# Patient Record
Sex: Male | Born: 1950 | Race: White | Hispanic: No | Marital: Married | Smoking: Former smoker
Health system: Southern US, Community
[De-identification: ages and names within clinical notes are randomized; demographics above are authoritative.]

## PROBLEM LIST (undated history)

## (undated) DIAGNOSIS — I471 Supraventricular tachycardia, unspecified: Secondary | ICD-10-CM

## (undated) DIAGNOSIS — N200 Calculus of kidney: Secondary | ICD-10-CM

## (undated) DIAGNOSIS — I4891 Unspecified atrial fibrillation: Secondary | ICD-10-CM

## (undated) DIAGNOSIS — I10 Essential (primary) hypertension: Secondary | ICD-10-CM

## (undated) DIAGNOSIS — L300 Nummular dermatitis: Secondary | ICD-10-CM

## (undated) DIAGNOSIS — D229 Melanocytic nevi, unspecified: Secondary | ICD-10-CM

## (undated) DIAGNOSIS — L719 Rosacea, unspecified: Secondary | ICD-10-CM

## (undated) HISTORY — PX: TONSILLECTOMY: SUR1361

## (undated) HISTORY — PX: SINUS SURGERY WITH INSTATRAK: SHX5215

## (undated) HISTORY — DX: Rosacea, unspecified: L71.9

## (undated) HISTORY — DX: Supraventricular tachycardia, unspecified: I47.10

## (undated) HISTORY — DX: Nummular dermatitis: L30.0

## (undated) HISTORY — PX: LIPOMA EXCISION: SHX5283

## (undated) HISTORY — DX: Unspecified atrial fibrillation: I48.91

## (undated) HISTORY — DX: Supraventricular tachycardia: I47.1

## (undated) HISTORY — DX: Essential (primary) hypertension: I10

## (undated) HISTORY — DX: Calculus of kidney: N20.0

---
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
## 2003-12-09 ENCOUNTER — Ambulatory Visit (HOSPITAL_COMMUNITY): Admission: RE | Admit: 2003-12-09 | Discharge: 2003-12-09 | Payer: Self-pay | Admitting: Internal Medicine

## 2004-09-11 ENCOUNTER — Ambulatory Visit: Payer: Self-pay | Admitting: Internal Medicine

## 2005-01-19 ENCOUNTER — Ambulatory Visit: Payer: Self-pay | Admitting: Internal Medicine

## 2005-04-30 ENCOUNTER — Ambulatory Visit: Payer: Self-pay | Admitting: Internal Medicine

## 2005-05-06 ENCOUNTER — Ambulatory Visit: Payer: Self-pay | Admitting: Internal Medicine

## 2005-05-13 ENCOUNTER — Ambulatory Visit: Payer: Self-pay | Admitting: Internal Medicine

## 2005-06-07 ENCOUNTER — Ambulatory Visit: Payer: Self-pay | Admitting: Internal Medicine

## 2005-07-02 ENCOUNTER — Ambulatory Visit: Payer: Self-pay | Admitting: Internal Medicine

## 2005-07-16 ENCOUNTER — Ambulatory Visit: Payer: Self-pay | Admitting: Internal Medicine

## 2005-07-28 ENCOUNTER — Ambulatory Visit: Payer: Self-pay | Admitting: Gastroenterology

## 2005-08-05 ENCOUNTER — Encounter (INDEPENDENT_AMBULATORY_CARE_PROVIDER_SITE_OTHER): Payer: Self-pay | Admitting: Specialist

## 2005-08-05 ENCOUNTER — Ambulatory Visit: Payer: Self-pay | Admitting: Gastroenterology

## 2005-08-20 ENCOUNTER — Ambulatory Visit: Payer: Self-pay | Admitting: Internal Medicine

## 2005-10-20 ENCOUNTER — Ambulatory Visit: Payer: Self-pay | Admitting: Internal Medicine

## 2006-01-17 ENCOUNTER — Ambulatory Visit: Payer: Self-pay | Admitting: Internal Medicine

## 2006-02-17 ENCOUNTER — Ambulatory Visit: Payer: Self-pay | Admitting: Internal Medicine

## 2006-02-28 ENCOUNTER — Ambulatory Visit: Payer: Self-pay | Admitting: Internal Medicine

## 2006-03-01 ENCOUNTER — Ambulatory Visit: Payer: Self-pay | Admitting: Internal Medicine

## 2006-05-19 ENCOUNTER — Ambulatory Visit: Payer: Self-pay | Admitting: Internal Medicine

## 2006-06-30 ENCOUNTER — Ambulatory Visit: Payer: Self-pay | Admitting: Internal Medicine

## 2006-07-15 ENCOUNTER — Ambulatory Visit: Payer: Self-pay | Admitting: Internal Medicine

## 2006-08-08 ENCOUNTER — Ambulatory Visit: Payer: Self-pay | Admitting: Internal Medicine

## 2006-11-21 ENCOUNTER — Ambulatory Visit: Payer: Self-pay | Admitting: Internal Medicine

## 2006-12-28 ENCOUNTER — Ambulatory Visit: Payer: Self-pay | Admitting: Internal Medicine

## 2006-12-28 LAB — CONVERTED CEMR LAB
ALT: 16 units/L (ref 0–40)
Alkaline Phosphatase: 70 units/L (ref 39–117)
BUN: 19 mg/dL (ref 6–23)
Basophils Relative: 0.5 % (ref 0.0–1.0)
Bilirubin Urine: NEGATIVE
Bilirubin, Direct: 0.1 mg/dL (ref 0.0–0.3)
CO2: 28 meq/L (ref 19–32)
Calcium: 9.5 mg/dL (ref 8.4–10.5)
Creatinine, Ser: 1 mg/dL (ref 0.4–1.5)
Eosinophils Relative: 6.1 % — ABNORMAL HIGH (ref 0.0–5.0)
Glucose, Bld: 99 mg/dL (ref 70–99)
Hemoglobin: 15.3 g/dL (ref 13.0–17.0)
Leukocytes, UA: NEGATIVE
Lymphocytes Relative: 22.6 % (ref 12.0–46.0)
Monocytes Absolute: 0.5 10*3/uL (ref 0.2–0.7)
Monocytes Relative: 7.3 % (ref 3.0–11.0)
Neutro Abs: 4.1 10*3/uL (ref 1.4–7.7)
Nitrite: NEGATIVE
RDW: 12.6 % (ref 11.5–14.6)
Specific Gravity, Urine: 1.025 (ref 1.000–1.03)
Total Bilirubin: 0.7 mg/dL (ref 0.3–1.2)
Total Protein, Urine: NEGATIVE mg/dL
Total Protein: 6.9 g/dL (ref 6.0–8.3)
Urobilinogen, UA: 0.2 (ref 0.0–1.0)
VLDL: 8 mg/dL (ref 0–40)
WBC: 6.4 10*3/uL (ref 4.5–10.5)
pH: 6.5 (ref 5.0–8.0)

## 2007-01-03 ENCOUNTER — Ambulatory Visit: Payer: Self-pay | Admitting: Internal Medicine

## 2007-01-11 ENCOUNTER — Ambulatory Visit: Payer: Self-pay | Admitting: Internal Medicine

## 2007-01-27 ENCOUNTER — Ambulatory Visit: Payer: Self-pay | Admitting: Internal Medicine

## 2007-02-02 ENCOUNTER — Ambulatory Visit: Payer: Self-pay | Admitting: Internal Medicine

## 2007-02-09 ENCOUNTER — Ambulatory Visit: Payer: Self-pay | Admitting: Internal Medicine

## 2007-04-04 ENCOUNTER — Ambulatory Visit: Payer: Self-pay | Admitting: Internal Medicine

## 2007-07-10 ENCOUNTER — Ambulatory Visit: Payer: Self-pay | Admitting: Internal Medicine

## 2007-08-09 ENCOUNTER — Ambulatory Visit: Payer: Self-pay | Admitting: Internal Medicine

## 2007-08-18 ENCOUNTER — Ambulatory Visit: Payer: Self-pay | Admitting: Internal Medicine

## 2007-10-09 ENCOUNTER — Telehealth (INDEPENDENT_AMBULATORY_CARE_PROVIDER_SITE_OTHER): Payer: Self-pay | Admitting: *Deleted

## 2007-12-18 ENCOUNTER — Ambulatory Visit: Payer: Self-pay | Admitting: Internal Medicine

## 2008-01-08 ENCOUNTER — Ambulatory Visit: Payer: Self-pay | Admitting: Internal Medicine

## 2008-01-08 DIAGNOSIS — J4520 Mild intermittent asthma, uncomplicated: Secondary | ICD-10-CM

## 2008-01-08 DIAGNOSIS — J309 Allergic rhinitis, unspecified: Secondary | ICD-10-CM | POA: Insufficient documentation

## 2008-01-08 DIAGNOSIS — I4891 Unspecified atrial fibrillation: Secondary | ICD-10-CM | POA: Insufficient documentation

## 2008-04-25 ENCOUNTER — Ambulatory Visit: Payer: Self-pay | Admitting: Internal Medicine

## 2008-05-30 ENCOUNTER — Ambulatory Visit: Payer: Self-pay | Admitting: Internal Medicine

## 2008-05-30 LAB — CONVERTED CEMR LAB
ALT: 18 units/L (ref 0–53)
AST: 24 units/L (ref 0–37)
Albumin: 4 g/dL (ref 3.5–5.2)
Alkaline Phosphatase: 63 units/L (ref 39–117)
BUN: 15 mg/dL (ref 6–23)
CO2: 29 meq/L (ref 19–32)
Chloride: 107 meq/L (ref 96–112)
Eosinophils Absolute: 0.4 10*3/uL (ref 0.0–0.7)
Eosinophils Relative: 6.6 % — ABNORMAL HIGH (ref 0.0–5.0)
GFR calc non Af Amer: 93 mL/min
Hemoglobin, Urine: NEGATIVE
LDL Cholesterol: 124 mg/dL — ABNORMAL HIGH (ref 0–99)
Leukocytes, UA: NEGATIVE
Lymphocytes Relative: 28.4 % (ref 12.0–46.0)
MCV: 89.8 fL (ref 78.0–100.0)
Monocytes Relative: 8.1 % (ref 3.0–12.0)
Neutrophils Relative %: 56.6 % (ref 43.0–77.0)
Nitrite: NEGATIVE
Platelets: 224 10*3/uL (ref 150–400)
Potassium: 4.4 meq/L (ref 3.5–5.1)
RBC: 4.82 M/uL (ref 4.22–5.81)
Specific Gravity, Urine: 1.02 (ref 1.000–1.03)
Total CHOL/HDL Ratio: 3.4
Urobilinogen, UA: 0.2 (ref 0.0–1.0)
VLDL: 10 mg/dL (ref 0–40)
WBC: 6.6 10*3/uL (ref 4.5–10.5)

## 2008-06-03 ENCOUNTER — Ambulatory Visit: Payer: Self-pay | Admitting: Internal Medicine

## 2008-06-03 DIAGNOSIS — R5381 Other malaise: Secondary | ICD-10-CM | POA: Insufficient documentation

## 2008-06-03 DIAGNOSIS — M79609 Pain in unspecified limb: Secondary | ICD-10-CM

## 2008-06-03 DIAGNOSIS — L719 Rosacea, unspecified: Secondary | ICD-10-CM | POA: Insufficient documentation

## 2008-06-03 DIAGNOSIS — I471 Supraventricular tachycardia: Secondary | ICD-10-CM

## 2008-06-03 DIAGNOSIS — R5383 Other fatigue: Secondary | ICD-10-CM

## 2008-06-07 ENCOUNTER — Encounter: Payer: Self-pay | Admitting: Internal Medicine

## 2008-08-20 ENCOUNTER — Encounter: Payer: Self-pay | Admitting: Internal Medicine

## 2008-08-20 ENCOUNTER — Ambulatory Visit: Payer: Self-pay | Admitting: Internal Medicine

## 2008-08-20 DIAGNOSIS — R002 Palpitations: Secondary | ICD-10-CM | POA: Insufficient documentation

## 2008-08-26 ENCOUNTER — Ambulatory Visit: Payer: Self-pay | Admitting: Internal Medicine

## 2008-08-26 ENCOUNTER — Ambulatory Visit: Payer: Self-pay | Admitting: Cardiology

## 2008-08-26 LAB — CONVERTED CEMR LAB
BUN: 12 mg/dL (ref 6–23)
Chloride: 107 meq/L (ref 96–112)
GFR calc Af Amer: 112 mL/min
Glucose, Bld: 93 mg/dL (ref 70–99)
Potassium: 4 meq/L (ref 3.5–5.1)

## 2008-08-29 ENCOUNTER — Ambulatory Visit: Payer: Self-pay | Admitting: Cardiology

## 2008-08-29 ENCOUNTER — Ambulatory Visit: Payer: Self-pay

## 2008-08-29 ENCOUNTER — Encounter: Payer: Self-pay | Admitting: Cardiology

## 2008-12-02 ENCOUNTER — Ambulatory Visit: Payer: Self-pay | Admitting: Internal Medicine

## 2009-01-15 ENCOUNTER — Ambulatory Visit: Payer: Self-pay | Admitting: Internal Medicine

## 2009-06-03 ENCOUNTER — Ambulatory Visit: Payer: Self-pay | Admitting: Internal Medicine

## 2009-06-03 DIAGNOSIS — R21 Rash and other nonspecific skin eruption: Secondary | ICD-10-CM

## 2009-06-04 ENCOUNTER — Ambulatory Visit: Payer: Self-pay | Admitting: Internal Medicine

## 2009-06-18 ENCOUNTER — Encounter (INDEPENDENT_AMBULATORY_CARE_PROVIDER_SITE_OTHER): Payer: Self-pay | Admitting: *Deleted

## 2009-06-18 ENCOUNTER — Telehealth: Payer: Self-pay | Admitting: Internal Medicine

## 2009-06-20 ENCOUNTER — Ambulatory Visit: Payer: Self-pay | Admitting: Internal Medicine

## 2009-06-30 ENCOUNTER — Telehealth: Payer: Self-pay | Admitting: Internal Medicine

## 2009-09-12 ENCOUNTER — Ambulatory Visit: Payer: Self-pay | Admitting: Internal Medicine

## 2009-09-16 ENCOUNTER — Ambulatory Visit: Payer: Self-pay | Admitting: Internal Medicine

## 2009-09-19 ENCOUNTER — Ambulatory Visit: Payer: Self-pay | Admitting: Internal Medicine

## 2009-09-26 ENCOUNTER — Ambulatory Visit: Payer: Self-pay | Admitting: Internal Medicine

## 2009-10-06 ENCOUNTER — Ambulatory Visit: Payer: Self-pay | Admitting: Internal Medicine

## 2009-10-13 ENCOUNTER — Ambulatory Visit: Payer: Self-pay | Admitting: Internal Medicine

## 2009-10-21 ENCOUNTER — Encounter: Payer: Self-pay | Admitting: Internal Medicine

## 2009-10-21 ENCOUNTER — Ambulatory Visit: Payer: Self-pay | Admitting: Internal Medicine

## 2009-10-22 ENCOUNTER — Ambulatory Visit: Payer: Self-pay | Admitting: Internal Medicine

## 2009-10-28 ENCOUNTER — Ambulatory Visit: Payer: Self-pay | Admitting: Internal Medicine

## 2009-11-05 ENCOUNTER — Ambulatory Visit: Payer: Self-pay | Admitting: Internal Medicine

## 2009-11-11 ENCOUNTER — Ambulatory Visit: Payer: Self-pay | Admitting: Internal Medicine

## 2009-11-18 ENCOUNTER — Ambulatory Visit: Payer: Self-pay | Admitting: Internal Medicine

## 2009-11-25 ENCOUNTER — Ambulatory Visit: Payer: Self-pay | Admitting: Internal Medicine

## 2009-12-02 ENCOUNTER — Ambulatory Visit: Payer: Self-pay | Admitting: Internal Medicine

## 2009-12-09 ENCOUNTER — Ambulatory Visit: Payer: Self-pay | Admitting: Internal Medicine

## 2009-12-16 ENCOUNTER — Ambulatory Visit: Payer: Self-pay | Admitting: Internal Medicine

## 2009-12-23 ENCOUNTER — Ambulatory Visit: Payer: Self-pay | Admitting: Internal Medicine

## 2009-12-30 ENCOUNTER — Ambulatory Visit: Payer: Self-pay | Admitting: Internal Medicine

## 2010-01-06 ENCOUNTER — Ambulatory Visit: Payer: Self-pay | Admitting: Internal Medicine

## 2010-01-13 ENCOUNTER — Ambulatory Visit: Payer: Self-pay | Admitting: Internal Medicine

## 2010-01-20 ENCOUNTER — Ambulatory Visit: Payer: Self-pay | Admitting: Internal Medicine

## 2010-01-27 ENCOUNTER — Ambulatory Visit: Payer: Self-pay | Admitting: Internal Medicine

## 2010-02-03 ENCOUNTER — Ambulatory Visit: Payer: Self-pay | Admitting: Internal Medicine

## 2010-02-10 ENCOUNTER — Ambulatory Visit: Payer: Self-pay | Admitting: Internal Medicine

## 2010-02-17 ENCOUNTER — Ambulatory Visit: Payer: Self-pay | Admitting: Internal Medicine

## 2010-02-18 ENCOUNTER — Ambulatory Visit: Payer: Self-pay | Admitting: Internal Medicine

## 2010-02-24 ENCOUNTER — Ambulatory Visit: Payer: Self-pay | Admitting: Internal Medicine

## 2010-03-03 ENCOUNTER — Ambulatory Visit: Payer: Self-pay | Admitting: Internal Medicine

## 2010-03-10 ENCOUNTER — Ambulatory Visit: Payer: Self-pay | Admitting: Internal Medicine

## 2010-03-17 ENCOUNTER — Ambulatory Visit: Payer: Self-pay | Admitting: Internal Medicine

## 2010-03-24 ENCOUNTER — Ambulatory Visit: Payer: Self-pay | Admitting: Internal Medicine

## 2010-04-02 ENCOUNTER — Ambulatory Visit: Payer: Self-pay | Admitting: Internal Medicine

## 2010-04-09 ENCOUNTER — Ambulatory Visit: Payer: Self-pay | Admitting: Internal Medicine

## 2010-04-16 ENCOUNTER — Ambulatory Visit: Payer: Self-pay | Admitting: Internal Medicine

## 2010-04-23 ENCOUNTER — Ambulatory Visit: Payer: Self-pay | Admitting: Internal Medicine

## 2010-04-30 ENCOUNTER — Ambulatory Visit: Payer: Self-pay | Admitting: Internal Medicine

## 2010-05-07 ENCOUNTER — Ambulatory Visit: Payer: Self-pay | Admitting: Internal Medicine

## 2010-05-14 ENCOUNTER — Ambulatory Visit: Payer: Self-pay | Admitting: Internal Medicine

## 2010-05-21 ENCOUNTER — Ambulatory Visit: Payer: Self-pay | Admitting: Internal Medicine

## 2010-06-02 ENCOUNTER — Ambulatory Visit: Payer: Self-pay | Admitting: Internal Medicine

## 2010-06-09 ENCOUNTER — Ambulatory Visit: Payer: Self-pay | Admitting: Internal Medicine

## 2010-06-16 ENCOUNTER — Ambulatory Visit: Payer: Self-pay | Admitting: Internal Medicine

## 2010-06-18 ENCOUNTER — Ambulatory Visit: Payer: Self-pay | Admitting: Internal Medicine

## 2010-06-18 LAB — CONVERTED CEMR LAB
AST: 20 units/L (ref 0–37)
Albumin: 3.9 g/dL (ref 3.5–5.2)
Basophils Absolute: 0 10*3/uL (ref 0.0–0.1)
Bilirubin Urine: NEGATIVE
CO2: 28 meq/L (ref 19–32)
Eosinophils Absolute: 0.5 10*3/uL (ref 0.0–0.7)
Glucose, Bld: 86 mg/dL (ref 70–99)
HCT: 44.5 % (ref 39.0–52.0)
HDL: 53.2 mg/dL (ref >39.00)
Hemoglobin: 15.2 g/dL (ref 13.0–17.0)
Lymphs Abs: 1.9 10*3/uL (ref 0.7–4.0)
MCHC: 34.3 g/dL (ref 30.0–36.0)
MCV: 89.8 fL (ref 78.0–100.0)
Neutro Abs: 4.2 10*3/uL (ref 1.4–7.7)
Nitrite: NEGATIVE
Potassium: 4.6 meq/L (ref 3.5–5.1)
RDW: 13.3 % (ref 11.5–14.6)
Sodium: 142 meq/L (ref 135–145)
TSH: 0.94 microintl units/mL (ref 0.35–5.50)
Total Protein, Urine: NEGATIVE mg/dL
Triglycerides: 69 mg/dL (ref 0.0–149.0)
pH: 6 (ref 5.0–8.0)

## 2010-06-23 ENCOUNTER — Ambulatory Visit: Payer: Self-pay | Admitting: Internal Medicine

## 2010-06-24 ENCOUNTER — Ambulatory Visit: Payer: Self-pay | Admitting: Internal Medicine

## 2010-06-24 DIAGNOSIS — R1031 Right lower quadrant pain: Secondary | ICD-10-CM

## 2010-06-24 DIAGNOSIS — R109 Unspecified abdominal pain: Secondary | ICD-10-CM

## 2010-06-25 DIAGNOSIS — I10 Essential (primary) hypertension: Secondary | ICD-10-CM | POA: Insufficient documentation

## 2010-06-30 ENCOUNTER — Ambulatory Visit: Payer: Self-pay | Admitting: Internal Medicine

## 2010-07-01 ENCOUNTER — Ambulatory Visit: Payer: Self-pay | Admitting: Internal Medicine

## 2010-07-07 ENCOUNTER — Ambulatory Visit: Payer: Self-pay | Admitting: Internal Medicine

## 2010-07-14 ENCOUNTER — Ambulatory Visit: Payer: Self-pay | Admitting: Internal Medicine

## 2010-07-21 ENCOUNTER — Ambulatory Visit: Payer: Self-pay | Admitting: Internal Medicine

## 2010-07-28 ENCOUNTER — Ambulatory Visit: Payer: Self-pay | Admitting: Internal Medicine

## 2010-08-04 ENCOUNTER — Ambulatory Visit: Payer: Self-pay | Admitting: Internal Medicine

## 2010-08-06 ENCOUNTER — Emergency Department (HOSPITAL_COMMUNITY): Admission: EM | Admit: 2010-08-06 | Discharge: 2010-06-15 | Payer: Self-pay | Admitting: Emergency Medicine

## 2010-08-11 ENCOUNTER — Encounter: Payer: Self-pay | Admitting: Gastroenterology

## 2010-08-11 ENCOUNTER — Ambulatory Visit: Payer: Self-pay | Admitting: Internal Medicine

## 2010-08-18 ENCOUNTER — Ambulatory Visit: Payer: Self-pay | Admitting: Internal Medicine

## 2010-08-28 ENCOUNTER — Ambulatory Visit: Payer: Self-pay | Admitting: Internal Medicine

## 2010-09-12 ENCOUNTER — Ambulatory Visit: Payer: Self-pay | Admitting: Internal Medicine

## 2010-09-15 ENCOUNTER — Ambulatory Visit
Admission: RE | Admit: 2010-09-15 | Discharge: 2010-09-15 | Payer: Self-pay | Source: Home / Self Care | Attending: Internal Medicine | Admitting: Internal Medicine

## 2010-09-15 ENCOUNTER — Encounter: Payer: Self-pay | Admitting: Internal Medicine

## 2010-09-17 ENCOUNTER — Ambulatory Visit: Payer: Self-pay | Admitting: Internal Medicine

## 2010-09-23 ENCOUNTER — Ambulatory Visit: Payer: Self-pay | Admitting: Internal Medicine

## 2010-09-29 ENCOUNTER — Ambulatory Visit: Payer: Self-pay | Admitting: Internal Medicine

## 2010-10-01 NOTE — Assessment & Plan Note (Signed)
Summary: rov 1 yr ///kp   Primary Provider/Referring Provider:  Plotnikov  CC:  Yearly follow up visit-allergies.PT c/o stuffiness in mornings..  History of Present Illness: Hx of ATRIAL FIBRILLATION (ICD-427.31) ASTHMA (ICD-493.90) ALLERGIC RHINITIS (ICD-477.9)  He returns for scheduled 6 month follow-up.  Advair was changed to symbicort in February because of thrush.  He also had a retinal bleed and questioned whether that was related to his inhaler.  Because of that he never actually took the symbicort and he quit Advair.  Since then his lungs have occasionally felt a little tight, but not uncomfortable.  He has only used his albuterol rescue inhaler one time in the past 3 weeks.  He is a bike rider, sometimes going 100 to 150 miles in a week.  He continues allergy vaccine at 1:10 given by a friend, who is a Engineer, civil (consulting), with no problems.  We have reviewed risk and benefit.  His breathing does not wake him and he is satisfied to stay off of maintenance inhalers for now.   09-21-2009- Asthma, allergic rhinitis, hx atrial fib He continues allergy shots at 1:10 and still feels they help. He now gets his shots here with no problem. Using loratadine for rash since August.  Skin Bx suggested drug allergy and meds were adjusted. Dermatology appointment is pending. Rare need for Proventil rescue inhaler. It doesn't affect his heart rhythm. Biking regularly again after job change.  September 15, 2010- Asthma, allergic rhinitis, hx atrial fib Nurse-CC: Yearly follow up visit-allergies.PT c/o stuffiness in mornings. I think he is better off with allergy shots and doesn't want to quit. Wakes recently with some nasal congestion. We discussed decongestants. Not much itch, sneeze, drainage. Uses loratadine more for rash than for nose.  Asthma much better in past year, using rescue inhaler only twice.  Meds have not triggerd his PAF, which is managed w/ metoprolol. Still bike-rider.      Asthma  History    Initial Asthma Severity Rating:    Age range: 12+ years    Symptoms: 0-2 days/week    Nighttime Awakenings: 0-2/month    Interferes w/ normal activity: no limitations    SABA use (not for EIB): 0-2 days/week    Asthma Severity Assessment: Intermittent   Preventive Screening-Counseling & Management  Alcohol-Tobacco     Smoking Status: never     Year Quit: 16 years  Current Medications (verified): 1)  Proventil Hfa 108 (90 Base) Mcg/act  Aers (Albuterol Sulfate) .... 2 Puffs Four Times A Day As Needed 2)  Allergy Vaccine 1:10 Gh 3)  Vitamin D3 1000 Unit  Tabs (Cholecalciferol) .Marland Kitchen.. 1 By Mouth Daily 4)  Aspirin 325 Mg Tabs (Aspirin) .... Take 1 Tab By Mouth Every Day 5)  Triamcinolone Acetonide 0.5 % Crea (Triamcinolone Acetonide) .... Apply Bid To Affected Area 6)  Loratadine 10 Mg  Tabs (Loratadine) .... Once Daily For Allergies 7)  Doxycycline Hyclate 100 Mg Tabs (Doxycycline Hyclate) .... 2 By Mouth Once Daily 8)  Ibuprofen 600 Mg Tabs (Ibuprofen) .Marland Kitchen.. 1 By Mouth Bid  Pc X 1 Wk Then As Needed For  Pain 9)  Metoprolol Succinate 50 Mg Xr24h-Tab (Metoprolol Succinate) .Marland Kitchen.. 1 By Mouth Qd  Allergies (verified): 1)  ! Verapamil  Past History:  Past Surgical History: Last updated: 01/08/2008 removed lipoma Tonsillectomy  Family History: Last updated: 09/21/2009 Mother died lymphoma and MI Father living  Social History: Last updated: 06/24/2010 Patient states former smoker.  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  The patient  denies shortness of breath with activity, shortness of breath at rest, productive cough, non-productive cough, coughing up blood, chest pain, irregular heartbeats, acid heartburn, indigestion, loss of appetite, weight change, abdominal pain, difficulty swallowing, sore throat, tooth/dental problems, headaches, sneezing, itching, ear ache, depression, rash, and change in color of mucus.    Vital Signs:  Patient profile:   60 year old male Height:      74 inches Weight:      214.13 pounds BMI:     27.59 O2 Sat:      98 % on Room air Pulse rate:   69 / minute BP sitting:   142 / 80  (left arm) Cuff size:   regular  Vitals Entered By: Abigail Miyamoto RN (September 15, 2010 9:00 AM)  O2 Flow:  Room air CC: Yearly follow up visit-allergies.PT c/o stuffiness in mornings.   Physical Exam  Additional Exam:  General: A/Ox3; pleasant and cooperative, NAD, SKIN: chronic acne. rosacea NODES: no lymphadenopathy HEENT: [place]/AT, EOM- WNL, Conjuctivae- clear, PERRLA, TM-WNL, Nose- spur right nostril, Throat- clear and wnl, Mallampati  II-III NECK: Supple w/ fair ROM, JVD- none, normal carotid impulses w/o bruits Thyroid-  CHEST: Clear to P&A HEART: RRR, no m/g/r heard ABDOMEN: Soft and nl;  UEA:VWUJ, nl pulses, no edema  NEURO: Grossly intact to observation      Impression & Recommendations:  Problem # 1:  ALLERGIC RHINITIS (ICD-477.9)  Fairly good control. We will continue allergy vaccine. discussd decongestants and Neti pot/ saline His updated medication list for this problem includes:    Loratadine 10 Mg Tabs (Loratadine) ..... Once daily for allergies  Problem # 2:  ASTHMA (ICD-493.90) Mild intermittent and well controlled. Discussed potential for acceleration of heart rhythm by bronchodilators/ stimulants.   Other Orders: Est. Patient Level III (81191)  Patient Instructions: 1)  Please schedule a follow-up appointment in 1 year. 2)  Script refill Proventil rescue inhaler 3)  OK to  use decongestant like Sudafed (sign at counter)  4)  or    phenylephrine ( Sudafed-PE,  others) otc.  5)  Consider rinsing stuffy nose as needed with saline using a Neti Pot or squeeze bottle.  6)  continuing allergy vaccine Prescriptions: PROVENTIL HFA 108 (90 BASE) MCG/ACT  AERS (ALBUTEROL SULFATE) 2 puffs four times a day as needed  #1 x 6   Entered and Authorized by:   Waymon Budge MD   Signed by:   Waymon Budge MD on 09/15/2010   Method used:   Print then Give to Patient   RxID:   [id number]

## 2010-10-01 NOTE — Miscellaneous (Signed)
Summary: Injection Record / [facility]    Injection Record /  Allergy    Imported By: Lennie Odor 07/07/2010 15:03:56  _____________________________________________________________________  External Attachment:    Type:   Image     Comment:   External Document

## 2010-10-01 NOTE — Letter (Signed)
Summary: Colonoscopy Letter  [facility]  [address]   [place], [place] [address]   Phone: [phone]  Fax: [phone]      August 11, 2010 MRN: [id number]   John Boone [address] [place], [place]  [address]   Dear [name],   According to your medical record, it is time for you to schedule a Colonoscopy. The American Cancer Society recommends this procedure as a method to detect early colon cancer. Patients with a family history of colon cancer, or a personal history of colon polyps or inflammatory bowel disease are at increased risk.  This letter has been generated based on the recommendations made at the time of your procedure. If you feel that in your particular situation this may no longer apply, please contact our office.  Please call our office at [phone] to schedule this appointment or to update your records at your earliest convenience.  Thank you for cooperating with [place] to provide you with the very best care possible.   Sincerely,   Claudette Head, M.D.  [facility] [phone]

## 2010-10-01 NOTE — Consult Note (Signed)
Summary: [facility]   Imported By: Sherian Rein 11/18/2009 12:14:46  _____________________________________________________________________  External Attachment:    Type:   Image     Comment:   External Document

## 2010-10-01 NOTE — Assessment & Plan Note (Signed)
Summary: CPX/[employer]/#/CD   Vital Signs:  Patient profile:   60 year old male Height:      74 inches Weight:      211 pounds BMI:     27.19 Temp:     98.5 degrees F oral Pulse rate:   72 / minute Pulse rhythm:   regular Resp:     16 per minute BP sitting:   140 / 100  (left arm) Cuff size:   regular  Vitals Entered By: Lanier Prude, Beverly Gust) (June 24, 2010 9:40 AM) CC: CPX Is Patient Diabetic? No Comments pt had recent EKG at [facility] hospital    Primary Care Provider:  Plotnikov  CC:  CPX.  History of Present Illness: The patient presents for a preventive health examination  He had RLQ pain x 1 month, had a nl CT/labs in ER  Current Medications (verified): 1)  Proventil Hfa 108 (90 Base) Mcg/act  Aers (Albuterol Sulfate) .... 2 Puffs Four Times A Day As Needed 2)  Allergy Vaccine 1:10 Gh 3)  Vitamin D3 1000 Unit  Tabs (Cholecalciferol) .Marland Kitchen.. 1 By Mouth Daily 4)  Aspirin 325 Mg Tabs (Aspirin) .... Take 1 Tab By Mouth Every Day 5)  Triamcinolone Acetonide 0.5 % Crea (Triamcinolone Acetonide) .... Apply Bid To Affected Area 6)  Loratadine 10 Mg  Tabs (Loratadine) .... Once Daily For Allergies 7)  Metoprolol Succinate 25 Mg Xr24h-Tab (Metoprolol Succinate) .Marland Kitchen.. 1 By Mouth Once Daily 8)  Doxycycline Hyclate 100 Mg Tabs (Doxycycline Hyclate) .... 2 By Mouth Once Daily  Allergies (verified): 1)  ! Verapamil  Past History:  Past Surgical History: Last updated: 01/08/2008 removed lipoma Tonsillectomy  Family History: Last updated: 10-08-09 Mother died lymphoma and MI Father living  Past Medical History: Allergic Rhinitis Asthma Rosacea Atrial Fibrillation one episode PAT Hypertension  Social History: Patient states former smoker.  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         R groin pain  Physical Exam  General:  Well-developed,well-nourished,in no acute distress; alert,appropriate and cooperative throughout examination Head:  Normocephalic and atraumatic without obvious abnormalities. No apparent alopecia or balding. Eyes:  No corneal or conjunctival inflammation noted. EOMI. Perrla.  Ears:  External ear exam shows no significant lesions or deformities.  Otoscopic examination reveals clear canals, tympanic membranes are intact bilaterally without bulging, retraction, inflammation or discharge. Hearing is grossly normal bilaterally. Nose:  External nasal examination shows no deformity or inflammation. Nasal mucosa are pink and moist without lesions or exudates. Mouth:  Oral mucosa and oropharynx without lesions or exudates.  Teeth in good repair. Neck:  No deformities, masses, or tenderness noted. Chest Wall:  No deformities, masses, tenderness or gynecomastia noted. Lungs:  Normal respiratory effort, chest expands symmetrically. Lungs are clear to auscultation, no crackles or wheezes. Heart:  Normal rate and regular rhythm. S1 and S2 normal without gallop, murmur, click, rub or other extra sounds. Abdomen:  Bowel sounds positive,abdomen soft and non-tender without masses, organomegaly or hernias noted. Rectal:  No external abnormalities noted. Normal sphincter tone. No rectal masses or tenderness. G(-)  Genitalia:  Testes bilaterally descended without nodularity, tenderness or masses. No scrotal masses or lesions. No penis lesions or urethral  discharge. Prostate:  1+ enlarged.   Msk:  Tender over R pubic ramus, no mass. No hernia Pulses:  R and L carotid,radial,femoral,dorsalis pedis and posterior tibial pulses are full and equal bilaterally Extremities:  No clubbing, cyanosis, edema, or deformity noted with normal full range of motion of all joints.   Neurologic:  No cranial nerve deficits noted. Station and gait are normal. Plantar reflexes are down-going bilaterally. DTRs are symmetrical throughout. Sensory, motor and coordinative functions appear intact. Skin:  Rosacea/rhinophima Cervical Nodes:  No lymphadenopathy noted Inguinal Nodes:  No significant adenopathy Psych:  Cognition and judgment appear intact. Alert and cooperative with normal attention span and concentration. No apparent delusions, illusions, hallucinations   Impression & Recommendations:  Problem # 1:  Preventive Health Care (ICD-V70.0) Assessment New Health and age related issues were discussed. Available screening tests and vaccinations were discussed as well. Healthy life style including good diet and exercise was discussed.  The labs were reviewed with the patient.   Problem # 2:  RLQ PAIN (ICD-789.03) R - likely ileopsas bursitis Assessment: New CT 1/20 IMPRESSION:    1.  No acute abnormalities identified within the abdomen or pelvis.   2.  Minimal diverticulosis along the proximal sigmoid colon,   without evidence of diverticulitis.   3.  Likely tiny hepatic cysts.   4.  Nonspecific bilateral perinephric stranding; kidneys otherwise   unremarkable in appearance.  Problem # 3:  GROIN PAIN (ICD-789.09) R - as per #2 Assessment: New See "Patient Instructions".     Ibuprofen 600 Mg Tabs (Ibuprofen) .Marland Kitchen... 1 by mouth bid  pc x 1 wk then as needed for  pain  Problem # 4:  ROSACEA (ICD-695.3) Assessment: Unchanged On the regimen of medicine(s) reflected in the chart    Problem # 5:  PALPITATIONS (ICD-785.1) Assessment: Comment Only  His  updated medication list for this problem includes:    Metoprolol Succinate 50 Mg Xr24h-tab (Metoprolol succinate) .Marland Kitchen... 1 by mouth qd  Problem # 6:  HYPERTENSION (ICD-401.9) Assessment: New  His updated medication list for this problem includes:    Metoprolol Succinate 50 Mg Xr24h-tab (Metoprolol succinate) .Marland Kitchen... 1 by mouth qd  Complete Medication List: 1)  Proventil Hfa 108 (90 Base) Mcg/act Aers (Albuterol sulfate) .... 2 puffs four times a day as needed 2)  Allergy Vaccine 1:10 Gh  3)  Vitamin D3 1000 Unit Tabs (Cholecalciferol) .Marland Kitchen.. 1 by mouth daily 4)  Aspirin 325 Mg Tabs (Aspirin) .... Take 1 tab by mouth every day 5)  Triamcinolone Acetonide 0.5 % Crea (Triamcinolone acetonide) .... Apply bid to affected area 6)  Loratadine 10 Mg Tabs (Loratadine) .... Once daily for allergies 7)  Doxycycline Hyclate 100 Mg Tabs (Doxycycline hyclate) .... 2 by mouth once daily 8)  Ibuprofen 600 Mg Tabs (Ibuprofen) .Marland Kitchen.. 1 by mouth bid  pc x 1 wk then as needed for  pain 9)  Metoprolol Succinate 50 Mg Xr24h-tab (Metoprolol succinate) .Marland Kitchen.. 1 by mouth qd  Patient Instructions: 1)  Please schedule a follow-up appointment in 6 months. 2)  Go on Youtube ([email or web address]) and look up "Ileopsoas stretch",  See the anatomy and learn the symptoms.  Do the stretches - it may help!  Prescriptions: LORATADINE 10 MG  TABS (LORATADINE) once daily for allergies  #30 x 11   Entered and Authorized by:   Tresa Garter MD  Signed by:   Tresa Garter MD on 06/24/2010   Method used:   Print then Give to Patient   RxID:   [id number] DOXYCYCLINE HYCLATE 100 MG TABS (DOXYCYCLINE HYCLATE) 2 by mouth once daily  #60 x 11   Entered and Authorized by:   Tresa Garter MD   Signed by:   Tresa Garter MD on 06/24/2010   Method used:   Print then Give to Patient   RxID:   [id number] PROVENTIL HFA 108 (90 BASE) MCG/ACT  AERS (ALBUTEROL SULFATE) 2 puffs four times a day as needed  #1 x 6    Entered and Authorized by:   Tresa Garter MD   Signed by:   Tresa Garter MD on 06/24/2010   Method used:   Print then Give to Patient   RxID:   [id number] METOPROLOL SUCCINATE 50 MG XR24H-TAB (METOPROLOL SUCCINATE) 1 by mouth qd  #30 x 11   Entered and Authorized by:   Tresa Garter MD   Signed by:   Tresa Garter MD on 06/24/2010   Method used:   Print then Give to Patient   RxID:   [id number] IBUPROFEN 600 MG TABS (IBUPROFEN) 1 by mouth bid  pc x 1 wk then as needed for  pain  #60 x 3   Entered and Authorized by:   Tresa Garter MD   Signed by:   Tresa Garter MD on 06/24/2010   Method used:   Print then Give to Patient   RxID:   [id number]    Orders Added: 1)  Est. Patient 40-64 years [[address]]   Immunization History:  Influenza Immunization History:    Influenza:  historical (06/02/2010)  Tetanus/Td Immunization History:    Tetanus/Td:  historical (05/05/2005)   Immunization History:  Influenza Immunization History:    Influenza:  Historical (06/02/2010)  Tetanus/Td Immunization History:    Tetanus/Td:  Historical (05/05/2005)

## 2010-10-01 NOTE — Miscellaneous (Signed)
Summary: Injection Record / [facility]    Injection Record / [facility]    Imported By: Lennie Odor 05/20/2010 11:41:18  _____________________________________________________________________  External Attachment:    Type:   Image     Comment:   External Document

## 2010-10-01 NOTE — Assessment & Plan Note (Signed)
Summary: rov//mbw   Visit Type:  annual visit Primary Provider/Referring Provider:  Plotnikov   History of Present Illness: Hx of ATRIAL FIBRILLATION (ICD-427.31) ASTHMA (ICD-493.90) ALLERGIC RHINITIS (ICD-477.9)  He returns for scheduled 6 month follow-up.  Advair was changed to symbicort in February because of thrush.  He also had a retinal bleed and questioned whether that was related to his inhaler.  Because of that he never actually took the symbicort and he quit Advair.  Since then his lungs have occasionally felt a little tight, but not uncomfortable.  He has only used his albuterol rescue inhaler one time in the past 3 weeks.  He is a bike rider, sometimes going 100 to 150 miles in a week.  He continues allergy vaccine at 1:10 given by a friend, who is a Engineer, civil (consulting), with no problems.  We have reviewed risk and benefit.  His breathing does not wake him and he is satisfied to stay off of maintenance inhalers for now.   Oct 07, 2009- Asthma, allergic rhinitis, hx atrial fib He continues allergy shots at 1:10 and still feels they help. He now gets his shots here with no problem. Using loratadine for rash since August.  Skin Bx suggested drug allergy and meds were adjusted. Dermatology appointment is pending. Rare need for Proventil rescue inhaler. It doesn't affect his heart rhythm. Biking regularly again after job change.     Current Medications (verified): 1)  Proventil Hfa 108 (90 Base) Mcg/act  Aers (Albuterol Sulfate) .... 2 Puffs Four Times A Day As Needed 2)  Allergy Vaccine 1:10 Go (W-E) 3)  Vitamin D3 1000 Unit  Tabs (Cholecalciferol) .Marland Kitchen.. 1 By Mouth Daily 4)  Aspirin 325 Mg Tabs (Aspirin) .... Take 1 Tab By Mouth Every Day 5)  Triamcinolone Acetonide 0.5 % Crea (Triamcinolone Acetonide) .... Apply Bid To Affected Area 6)  Loratadine 10 Mg  Tabs (Loratadine) .... Once Daily For Allergies  Allergies (verified): 1)  ! Verapamil  Past History:  Past Medical History: Last  updated: 06/03/2008 Allergic Rhinitis Asthma Rosacea Atrial Fibrillation one episode PAT  Past Surgical History: Last updated: 01/08/2008 removed lipoma Tonsillectomy  Family History: Last updated: 2009/10/07 Mother died lymphoma and MI Father living  Social History: Last updated: October 07, 2009 Patient states former smoker.  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    Vital Signs:  Patient profile:   60 year old male Height:      74 inches Weight:      205 pounds BMI:     26.42 O2 Sat:      98 % on Room air Pulse rate:   86 / minute BP sitting:   130 / 90  (left arm) Cuff size:   regular  Vitals Entered By: Clarise Cruz (Duncan Dull) (07-Oct-2009 10:00 AM)  O2 Flow:  Room air  Physical Exam  Additional Exam:  General: A/Ox3; pleasant and cooperative, NAD, SKIN: chronic acne NODES: no lymphadenopathy HEENT: [place]/AT, EOM- WNL, Conjuctivae- clear, PERRLA, TM-WNL, Nose-  clear, Throat- clear and wnl NECK: Supple w/ fair ROM, JVD- none, normal carotid impulses w/o bruits Thyroid-  CHEST: Clear to P&A HEART: RRR, no m/g/r heard ABDOMEN: Soft and nl;  EAV:WUJW, nl pulses, no edema  NEURO: Grossly intact to observation      Impression & Recommendations:  Problem # 1:  ASTHMA (ICD-493.90) Good control with occasional use of a rescue inhaler  Problem # 2:  ALLERGIC  RHINITIS (ICD-477.9)  He feels that allergy vaccine continues to help him. Supplemental loratadine now for rash. His updated medication list for this problem includes:    Loratadine 10 Mg Tabs (Loratadine) ..... Once daily for allergies  Orders: Est. Patient Level III (11914)  Problem # 3:  SKIN RASH (ICD-782.1) This is left to his dermatologist. He has a chronic acneform facial complection, but what he describes now evidently is different. His updated medication list for this problem includes:    Triamcinolone Acetonide 0.5 % Crea (Triamcinolone acetonide) .Marland Kitchen... Apply bid to affected area  Medications Added to Medication List This Visit: 1)  Allergy Vaccine 1:10 Gh   Patient Instructions: 1)  Schedule return in one year, earlier if needed 2)  Continue allergy vaccine. Call for problems.

## 2010-10-06 DIAGNOSIS — J301 Allergic rhinitis due to pollen: Secondary | ICD-10-CM

## 2010-10-13 ENCOUNTER — Ambulatory Visit (INDEPENDENT_AMBULATORY_CARE_PROVIDER_SITE_OTHER): Payer: 59

## 2010-10-13 DIAGNOSIS — J301 Allergic rhinitis due to pollen: Secondary | ICD-10-CM

## 2010-10-20 ENCOUNTER — Ambulatory Visit (INDEPENDENT_AMBULATORY_CARE_PROVIDER_SITE_OTHER): Payer: 59

## 2010-10-20 DIAGNOSIS — J301 Allergic rhinitis due to pollen: Secondary | ICD-10-CM

## 2010-10-21 NOTE — Miscellaneous (Signed)
Summary: Injection [employer] [facility] [employer]   Imported By: Sherian Rein 10/15/2010 08:38:58  _____________________________________________________________________  External Attachment:    Type:   Image     Comment:   External Document

## 2010-10-27 ENCOUNTER — Ambulatory Visit (INDEPENDENT_AMBULATORY_CARE_PROVIDER_SITE_OTHER): Payer: 59

## 2010-10-27 DIAGNOSIS — J301 Allergic rhinitis due to pollen: Secondary | ICD-10-CM

## 2010-10-28 ENCOUNTER — Encounter: Payer: Self-pay | Admitting: Internal Medicine

## 2010-10-28 ENCOUNTER — Ambulatory Visit (INDEPENDENT_AMBULATORY_CARE_PROVIDER_SITE_OTHER): Payer: 59

## 2010-10-28 DIAGNOSIS — J301 Allergic rhinitis due to pollen: Secondary | ICD-10-CM | POA: Insufficient documentation

## 2010-11-03 ENCOUNTER — Ambulatory Visit (INDEPENDENT_AMBULATORY_CARE_PROVIDER_SITE_OTHER): Payer: 59

## 2010-11-03 ENCOUNTER — Encounter: Payer: Self-pay | Admitting: Internal Medicine

## 2010-11-03 DIAGNOSIS — J301 Allergic rhinitis due to pollen: Secondary | ICD-10-CM

## 2010-11-05 NOTE — Assessment & Plan Note (Signed)
Summary: ALLERGY/CB   Nurse Visit   Allergies: 1)  ! Verapamil  Orders Added: 1)  [facility],1 or multi Secondary school teacher) [phone]

## 2010-11-10 ENCOUNTER — Encounter: Payer: Self-pay | Admitting: Internal Medicine

## 2010-11-10 ENCOUNTER — Ambulatory Visit (INDEPENDENT_AMBULATORY_CARE_PROVIDER_SITE_OTHER): Payer: 59

## 2010-11-10 DIAGNOSIS — J301 Allergic rhinitis due to pollen: Secondary | ICD-10-CM

## 2010-11-10 NOTE — Assessment & Plan Note (Signed)
Summary: ALLERGY/CB   Nurse Visit   Allergies: 1)  ! Verapamil  Orders Added: 1)  Allergy Injection (1) [[address]]

## 2010-11-11 LAB — COMPREHENSIVE METABOLIC PANEL
AST: 22 U/L (ref 0–37)
Albumin: 3.9 g/dL (ref 3.5–5.2)
Alkaline Phosphatase: 65 U/L (ref 39–117)
Chloride: 107 mEq/L (ref 96–112)
GFR calc Af Amer: >60 mL/min (ref >60)
Potassium: 3.8 mEq/L (ref 3.5–5.1)
Sodium: 139 mEq/L (ref 135–145)
Total Bilirubin: 0.5 mg/dL (ref 0.3–1.2)
Total Protein: 6.8 g/dL (ref 6.0–8.3)

## 2010-11-11 LAB — URINALYSIS, ROUTINE W REFLEX MICROSCOPIC
Hgb urine dipstick: NEGATIVE
Nitrite: NEGATIVE
Specific Gravity, Urine: 1.019 (ref 1.005–1.030)
Urobilinogen, UA: 0.2 mg/dL (ref 0.0–1.0)

## 2010-11-11 LAB — DIFFERENTIAL
Basophils Absolute: 0 10*3/uL (ref 0.0–0.1)
Basophils Relative: 0 % (ref 0–1)
Eosinophils Relative: 5 % (ref 0–5)
Monocytes Absolute: 0.6 10*3/uL (ref 0.1–1.0)
Monocytes Relative: 7 % (ref 3–12)

## 2010-11-11 LAB — CBC
MCV: 88.7 fL (ref 78.0–100.0)
Platelets: 227 10*3/uL (ref 150–400)
RBC: 4.95 MIL/uL (ref 4.22–5.81)
WBC: 8 10*3/uL (ref 4.0–10.5)

## 2010-11-17 ENCOUNTER — Ambulatory Visit (INDEPENDENT_AMBULATORY_CARE_PROVIDER_SITE_OTHER): Payer: 59

## 2010-11-17 DIAGNOSIS — J301 Allergic rhinitis due to pollen: Secondary | ICD-10-CM

## 2010-11-17 NOTE — Assessment & Plan Note (Signed)
Summary: allergy/cb   Nurse Visit   Allergies: 1)  ! Verapamil  Orders Added: 1)  Allergy Injection (1) [04540]

## 2010-11-24 ENCOUNTER — Ambulatory Visit (INDEPENDENT_AMBULATORY_CARE_PROVIDER_SITE_OTHER): Payer: 59

## 2010-11-24 DIAGNOSIS — J301 Allergic rhinitis due to pollen: Secondary | ICD-10-CM

## 2010-12-01 ENCOUNTER — Ambulatory Visit (INDEPENDENT_AMBULATORY_CARE_PROVIDER_SITE_OTHER): Payer: 59

## 2010-12-01 DIAGNOSIS — J301 Allergic rhinitis due to pollen: Secondary | ICD-10-CM

## 2010-12-08 ENCOUNTER — Ambulatory Visit (INDEPENDENT_AMBULATORY_CARE_PROVIDER_SITE_OTHER): Payer: 59

## 2010-12-08 DIAGNOSIS — J301 Allergic rhinitis due to pollen: Secondary | ICD-10-CM

## 2010-12-15 ENCOUNTER — Ambulatory Visit (INDEPENDENT_AMBULATORY_CARE_PROVIDER_SITE_OTHER): Payer: 59

## 2010-12-15 DIAGNOSIS — J309 Allergic rhinitis, unspecified: Secondary | ICD-10-CM

## 2010-12-22 ENCOUNTER — Ambulatory Visit (INDEPENDENT_AMBULATORY_CARE_PROVIDER_SITE_OTHER): Payer: 59

## 2010-12-22 DIAGNOSIS — J309 Allergic rhinitis, unspecified: Secondary | ICD-10-CM

## 2010-12-23 ENCOUNTER — Encounter: Payer: Self-pay | Admitting: Internal Medicine

## 2010-12-23 ENCOUNTER — Ambulatory Visit (INDEPENDENT_AMBULATORY_CARE_PROVIDER_SITE_OTHER): Payer: 59 | Admitting: Internal Medicine

## 2010-12-23 DIAGNOSIS — L719 Rosacea, unspecified: Secondary | ICD-10-CM

## 2010-12-23 DIAGNOSIS — I10 Essential (primary) hypertension: Secondary | ICD-10-CM

## 2010-12-23 DIAGNOSIS — R079 Chest pain, unspecified: Secondary | ICD-10-CM

## 2010-12-23 DIAGNOSIS — M25519 Pain in unspecified shoulder: Secondary | ICD-10-CM

## 2010-12-23 DIAGNOSIS — M25511 Pain in right shoulder: Secondary | ICD-10-CM | POA: Insufficient documentation

## 2010-12-23 DIAGNOSIS — R5381 Other malaise: Secondary | ICD-10-CM

## 2010-12-23 MED ORDER — MELOXICAM 15 MG PO TABS: 15.0000 mg | ORAL_TABLET | Freq: Every day | ORAL | Status: DC

## 2010-12-23 NOTE — Assessment & Plan Note (Signed)
On Rx 

## 2010-12-23 NOTE — Progress Notes (Signed)
  Subjective:    Patient ID: John Boone, male    DOB: [birth year], 60 y.o.   MRN: [id number]  HPI  The patient presents for a follow-up of  chronic hypertension, chronic dyslipidemia, rosacea controlled with medicines. C/o R shoulder pain x 6 mo, not better. It can keep him awake, worse with exercise. NSAIDs do not help much... C/o CP under rib cage L>R, sitting strait. BP at home is nl   Review of Systems  Constitutional: Negative for chills.  HENT: Negative for congestion.   Eyes: Negative for pain.  Respiratory: Positive for chest tightness. Negative for shortness of breath.   Cardiovascular: Negative for leg swelling.  Musculoskeletal: Negative for gait problem.  Skin: Positive for rash (acnae). Negative for wound.  Neurological: Negative for headaches.  Hematological: Negative for adenopathy.  Psychiatric/Behavioral: The patient is not nervous/anxious.    BP Readings from Last 3 Encounters:  12/23/10 142/98  09/15/10 142/80  06/24/10 140/100       Objective:   Physical Exam  Constitutional: He is oriented to person, place, and time. He appears well-developed.  HENT:  Mouth/Throat: Oropharynx is clear and moist.  Eyes: Conjunctivae are normal. Pupils are equal, round, and reactive to light.  Neck: Normal range of motion. No JVD present. No thyromegaly present.  Cardiovascular: Normal rate, regular rhythm, normal heart sounds and intact distal pulses.  Exam reveals no gallop and no friction rub.   No murmur heard. Pulmonary/Chest: Effort normal and breath sounds normal. No respiratory distress. He has no wheezes. He has no rales. He exhibits no tenderness.  Abdominal: Soft. Bowel sounds are normal. He exhibits no distension and no mass. There is no tenderness. There is no rebound and no guarding.  Musculoskeletal: Normal range of motion. He exhibits no edema and no tenderness.  Lymphadenopathy:    He has no cervical adenopathy.  Neurological: He is alert and oriented to  person, place, and time. He has normal reflexes. No cranial nerve deficit. He exhibits normal muscle tone. Coordination normal.  Skin: Skin is warm and dry. Rash (rosacea) noted.  Psychiatric: He has a normal mood and affect. His behavior is normal. Judgment and thought content normal.          Assessment & Plan:  HYPERTENSION On Rx  Shoulder pain, right Rotator cuff strain. Stretching exercise discussed. See meds. We can inject if not better.  ROSACEA On Rx  Chest pain As before - MSK. It is not related to physical activity.  FATIGUE Better.

## 2010-12-23 NOTE — Patient Instructions (Signed)
Stretch R shoulder

## 2010-12-28 NOTE — Assessment & Plan Note (Signed)
Better  

## 2010-12-28 NOTE — Assessment & Plan Note (Signed)
On Rx 

## 2010-12-28 NOTE — Assessment & Plan Note (Signed)
As before - MSK. It is not related to physical activity.

## 2010-12-28 NOTE — Assessment & Plan Note (Addendum)
Rotator cuff strain. Stretching exercise discussed. See meds. We can inject if not better.

## 2010-12-29 ENCOUNTER — Ambulatory Visit (INDEPENDENT_AMBULATORY_CARE_PROVIDER_SITE_OTHER): Payer: 59

## 2010-12-29 DIAGNOSIS — J309 Allergic rhinitis, unspecified: Secondary | ICD-10-CM

## 2011-01-05 ENCOUNTER — Ambulatory Visit (INDEPENDENT_AMBULATORY_CARE_PROVIDER_SITE_OTHER): Payer: 59

## 2011-01-05 DIAGNOSIS — J309 Allergic rhinitis, unspecified: Secondary | ICD-10-CM

## 2011-01-12 ENCOUNTER — Ambulatory Visit (INDEPENDENT_AMBULATORY_CARE_PROVIDER_SITE_OTHER): Payer: 59

## 2011-01-12 DIAGNOSIS — J309 Allergic rhinitis, unspecified: Secondary | ICD-10-CM

## 2011-01-12 NOTE — Assessment & Plan Note (Signed)
[facility]                             PULMONARY OFFICE NOTE   John, Boone                        MRN:          [id number]  DATE:07/10/2007                            DOB:          Jun 01, 1951    PROBLEMS:  1. Allergic rhinitis.  2. Mild intermittent asthma.  3. Remote history of paroxysmal atrial fibrillation.   PRIMARY CARE PHYSICIAN:  Dr. Georgina Quint. Plotnikov.   HISTORY:  Since about June he has been having more awareness of chest  tightness, which he says at times is actually painful, even to touch his  upper anterior chest and corresponding level over the spine.  Not  exertional, but he speaks of it most when he is riding his bicycle.  He  has the impression that his rescue inhaler helps this feeling.  There  have been no palpitations.  This weekend, he had to stop riding his bike  because of discomfort.  He has continued his routine medications.  He  has some increased cough with minimal sputum.   MEDICAL DECISION MAKING:  1. Allergy vaccine.  2. Doxycycline for skin.  3. Advair 100/50 mg.  4. Albuterol inhaler.  5. Claritin-D.   ALLERGIES:  No known drug allergies.   OBJECTIVE:  VITAL SIGNS:  Weight 200 pounds, which if correct is up  about 13 pounds since June.  Blood pressure 168/104, pulse 80, room air  saturation 100%.  CHEST:  Very clear.  HEART:  Sounds regular and normal.  LYMPH:  No adenopathy.   Pulmonary function testing last December had been high normal on all  parameters.   IMPRESSION:  1. Allergic rhinitis is stable.  2. Question exacerbation of asthma.  The way he describes pain, does      not sound like either asthma or angina.  I talked with him about      whether he was developing musculoskeletal pain, possibly related to      the posture of bike riding.   PLAN:  1. We are going to try changing his Advair to Symbicort 160/4.5 mg,      two puffs twice daily.  2. A chest x-ray.  3. Flu vaccine,  discussed and given.  4. Consider cardiopulmonary exercise test later.  5. Schedule a return in six months or earlier p.r.n.     Clinton D. Maple Hudson, MD, Tonny Bollman, FACP  Electronically Signed    CDY/MedQ  DD: 07/10/2007  DT: 07/10/2007  Job #: [id number]

## 2011-01-12 NOTE — Assessment & Plan Note (Signed)
[facility]                             PULMONARY OFFICE NOTE   John Boone, FUELLING                        MRN:          [id number]  DATE:02/09/2007                            DOB:          May 04, 1951    PULMONARY OFFICE FOLLOWUP   PROBLEMS:  1. Allergic rhinitis.  2. Mild intermittent asthma.  3. Remote history of PAT.   HISTORY:  Blames allergy for gradually worsening sinus and chest  congestion with wheeze that he says began just before his most recent  visit here in mid May.  He started himself back on Advair 100/50 and  feels that has definitely helped his chest in the last week.  Using some  claritin D.  He is riding his bike a lot, estimating 150 miles per week,  and we discussed what this meant in terms of exposure to the atmospheric  conditions current now.   MEDICATIONS:  1. Allergy vaccine at 1:10.  2. Doxycycline.  3. Advair 100/50.  4. Albuterol inhaler.  5. Claritin D.   No medication allergy.   OBJECTIVE:  Weight 187 pounds, BP 142/102, room air saturation 99%,  pulse 73.  He is alert.  Breath sounds are a little tubular and coarse, but without wheeze or  cough.  He is not labored.  There is no neck vein distension or stridor.  HEART:  Sounds are regular.   IMPRESSION:  Exacerbation of rhinitis and asthma.   PLAN:  We discussed options and he decided that all he really wanted to  do was to maintain current status using Advair 100/50 until the pollen  season fades.  He will keep his scheduled appointment for next fall, but  return earlier p.r.n.     Clinton D. Maple Hudson, MD, Tonny Bollman, FACP  Electronically Signed    CDY/MedQ  DD: 02/09/2007  DT: 02/10/2007  Job #: [id number]

## 2011-01-12 NOTE — Assessment & Plan Note (Signed)
[facility]                             PULMONARY OFFICE NOTE   John Boone, John Boone                        MRN:          [id number]  DATE:01/11/2007                            DOB:          1950/12/28    PROBLEM:  1. Allergic rhinitis.  2. Mild intermittent asthma.   HISTORY:  Mild nasal congestion for 2 weeks, and today chest feels a  little congested, not enough to blow anything out.  No sore throat or  fever.  He feels adequately controlled with his present medications,  taking occasional Claritin.  His employers allow him to work away from  the room where he felt there was an odor that bothered him, and he says  that area now has had a pipe leak, again, implying that prior water  damage and mold may part of the problem.   OBJECTIVE:  Weight 190 pounds.  BP 194/80.  Pulse 76.  Room air  saturation 99%.  CHEST:  Sounds completely clear today.  Voice quality is normal.  There is no significant nasal congestion.   MEDICATIONS:  1. Allergy vaccine continues at 1 to 10 with no problems.  2. Doxycycline p.r.n. for his skin.  3. Singulair.  4. Albuterol inhaler.  5. Claritin.   Pulmonary function tests on August 08, 2006 were completely normal to  supranormal, which is encouraging.  He had a CBC drawn with increased  eosinophil percentage of 6.1% as of December 28, 2006, reinforcing  impression that allergy is the significant aggravating factor for his  respiratory complaints.   IMPRESSION:  1. Allergic rhinitis.  2. Mild atopic asthma.   PLAN:  Continue with present medications.  Cautious about environmental  exposures.  Call if acute therapy needed, otherwise schedule return in 6  months, earlier p.r.n.     Clinton D. Maple Hudson, MD, Tonny Bollman, FACP  Electronically Signed    CDY/MedQ  DD: 01/11/2007  DT: 01/11/2007  Job #: [id number]

## 2011-01-12 NOTE — Assessment & Plan Note (Signed)
[facility]                            CARDIOLOGY OFFICE NOTE   John Boone, John Boone                        MRN:          [id number]  DATE:08/26/2008                            DOB:          01/14/1951    John Boone is a very pleasant 60 year old gentleman, who I am asked to  evaluate for palpitations and dizziness.  The patient has been seen in  this office many years ago.  He was seen by Pixie Casino on  August 04, 1995.  Apparently in September 1996, he had a stress test  and there are no electrocardiographic changes.  At that time, he also  had an echocardiogram that showed normal LV function with no evidence of  mitral valve prolapse and only trivial mitral regurgitation.  The  patient also had a monitor placed in December 1996.  He was found to  have a narrow complex tachycardia at a rate of 227.  He was ultimately  seen by Dr. 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  Ablation was discussed, but the patient preferred medical  therapy.  Since then, he has continued to have occasional bursts of SVT.  He describes this as heart racing and they typically lasts anywhere from  10 minutes to 1 hour, and resolves spontaneously.  However, they have  been relatively well controlled with Cardizem.  In fact, he has had none  of those episodes in the past 2-1/2 years.  However since September, he  has had a different type of palpitation.  He describes this as a hard  heartbeat.  There is a mild discomfort at the time of the event, but  nothing sustained.  He did have a 10-beat episode that made him feel  somewhat dizzy and weak.  He has not had a syncopal episode.  Note,  otherwise he has not had dyspnea on exertion, orthopnea, PND, pedal  edema, exertional chest pain, or syncope.  Because of the above, we were  asked to further evaluate.   MEDICATIONS:  1. Allergy shots.  2. Doxycycline 100 mg p.o. daily.  3. Cardizem CD  120 mg p.o. daily.  4. Vitamin D.  5. Aspirin.   ALLERGIES:  He has no known drug allergies.   SOCIAL HISTORY:  He has remote history of tobacco use, but has not  smoked since 1996.  He occasionally consumes alcohol.   FAMILY HISTORY:  Negative for coronary artery disease.  His mother did  have mitral valve prolapse.  His mother died of lymphoma.   PAST MEDICAL HISTORY:  There is no diabetes mellitus, hypertension, or  hyperlipidemia.  He does have a history of asthma.  He has a history of  paroxysmal atrial tachycardia as described in the HPI.  He has also had  a previous tonsillectomy.  He also has allergic rhinitis.  He has had a  history of rosacea.  There is also question history of atrial  fibrillation in the remote past.   REVIEW OF SYSTEMS:  He denies any headaches, fevers, or chills.  There  is no productive cough or hemoptysis.  There is no dysphagia,  odynophagia, melena, or hematochezia.  There is no dysuria or hematuria.  There is no rash or seizure activity.  There is no orthopnea, PND, or  pedal edema.  Remaining systems are negative.   PHYSICAL EXAMINATION:  VITAL SIGNS:  Today, blood pressure of 138/86 and  his pulse is 89.  He weighs 195 pounds.  GENERAL:  He is well developed, well nourished, no acute distress.  He  does not appear to be depressed.  SKIN:  Warm and dry.  There is no peripheral clubbing.  BACK:  Normal.  HEENT:  Normal with normal eyelids.  NECK:  Supple with a normal upstroke bilaterally.  No bruits heard.  There is no jugular vein distention and I cannot appreciate thyromegaly.  CHEST:  Clear to auscultation and lung expansion.  CARDIOVASCULAR:  Regular rate and rhythm.  Normal S1 and S2.  No  murmurs, rubs, or gallops noted.  ABDOMEN:  Nontender.  Positive bowel sounds.  No hepatosplenomegaly.  No  mass appreciated.  No abdominal bruit.  He has 2+ femoral pulses  bilaterally.  No bruits.  EXTREMITIES:  No edema to palpate.  No cords.  He  has 2+ dorsalis pedis  pulses bilaterally.  NEUROLOGIC:  Grossly intact.   I do have an electrocardiogram from primary care dated August 20, 2008.  This showed a sinus rhythm with sinus arrhythmia.  There is right  bundle-branch block.  There are no ST changes noted.  QT is normal.   DIAGNOSES:  1. 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      This found to be potentially premature ventricular contractions,      although he did have some weakness and dizziness with this.  We      will plan to repeat his echocardiogram and also schedule him to      have a CardioNet monitor.  He will continue on the Cardizem that he      was recently placed on, this apparently has helped his symptoms.      We will also check a TSH, magnesium, and BMET.  I will have him      return in 4-6 weeks to review the above information.  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  If symptoms worsen in the future then we could refer      him to one of our electrophysiologists for consideration of      ablation.  3. Question history of atrial fibrillation in the remote past.  4. Asthma.  5. History of rosacea.       Madolyn Frieze Jens Som, MD, [facility]  Electronically Signed   BSC/MedQ  DD: 08/26/2008  DT: 08/27/2008  Job #: [id number]   cc:   Georgina Quint. Plotnikov, MD

## 2011-01-12 NOTE — Assessment & Plan Note (Signed)
[facility]                           PRIMARY CARE OFFICE NOTE   John Boone, John Boone                        MRN:          [id number]  DATE:01/03/2007                            DOB:          Sep 23, 1950    The patient is a 60 year old male who presents for a wellness  examination.   ALLERGIES:  None.   Past medical history, family history, and social history as per  May 02, 2003 note.   REVIEW OF SYSTEMS:  No chest pain or shortness of breath, very  physically active riding his bike around 150 miles per week. More skin  problems on the face since he discontinued the medication. The rest of  the 18-point review of systems is negative.   MEDICATIONS:  Albuterol p.r.n. He ran out of Metrogel and doxicycline  quite a while ago.   PHYSICAL EXAMINATION:  VITAL SIGNS:  Blood pressure 137/82, pulse 79,  temperature 98.2, weight 194 pounds.  GENERAL:  She looks well.  HEENT:  Moist mucosa.  NECK:  Supple, no thyromegaly, or bruit.  LUNGS:  Clear. No wheezes or rales.  HEART:  S1, S2, no murmur, no gallop.  ABDOMEN:  Soft, nontender, no organomegaly or mass felt.  LOWER EXTREMITIES:  Without edema.  NEUROLOGIC:  He is alert, oriented and cooperative. Denies being  depressed.  GENITAL:  Normal external genitalia.  RECTAL:  Reveals normal prostate, slightly enlarged, no masses. No  nodules. Stool guaiac negative. No active hemorrhoids palpated.  SKIN:  Severe adult acne with __________.   LABORATORY DATA:  December 28, 2006, CBC normal, CMET normal, cholesterol  167, LDL 110. TSH normal. PSA 0.81. Urinalysis normal. EKG unchanged  from previous.   ASSESSMENT/PLAN:  1. Normal wellness examination. Age/health-related issues  discussed. Healthy lifestyle discussed. Repeat exam in 12 months.  Healthy diet and physical activity to continue.  1. History of colon polyps. He is due a colonoscopy in 2011.  2. Asthma. He is using albuterol 2 puffs q.i.d.  p.r.n. very      infrequently and not interested in any other therapy including      maintenance.  3. Adult acne/__________. Metrogel prescribed to use twice daily,      doxicycline 1 twice a day,      can reduce to 1 a day or 1 every other day when controlled. I will      see him back in 2-3 months.     Georgina Quint. Plotnikov, MD     AVP/MedQ  DD: 01/04/2007  DT: 01/04/2007  Job #: [id number]

## 2011-01-15 NOTE — Assessment & Plan Note (Signed)
[facility]                               PULMONARY OFFICE NOTE   RASHIDI, LOH                        MRN:          [id number]  DATE:07/15/2006                            DOB:          [birth year]    PROBLEM:  1. Allergic rhinitis.  2. Mild intermittent asthma.   PRIMARY CARE PHYSICIAN:  Georgina Quint. Plotnikov, M.D.   HISTORY OF PRESENT ILLNESS:  He has been moved to a different place in his  building, which he says gives a big improvement in his breathing comfort and  has allowed him to be off of most of his asthma medication.  He says in the  office he usually occupies, there is a fine black dust.  He says he had the  same experience three years ago when he was moved out of that particular  room.  He is in the same building, just a different office.  He feels well  enough now that he has been able to resume bike riding.  He works as a  Teacher, adult education work.  He no longer smokes  and has not for a long time.  He continues allergy vaccine at 1/10 with no  problems.   MEDICATIONS:  1. Allergy vaccine.  2. Advair 100/50 (currently not using).  3. Singulair 10 mg.  4. Albuterol inhaler (not needed).   ALLERGIES:  No medication allergy.   OBJECTIVE:  VITAL SIGNS:  Weight 182 pounds, blood pressure 160/100, pulse  regular and 88, room air saturation 99%.  HEENT:  Nasal airway is unobstructed.  LUNGS:  Lung fields are clear.  HEART:  Heart sounds regular without murmur.   IMPRESSION:  He certainly gives a history supporting impression that there  is asthma associated with some exposure in the specific office where he  usually works.  He asks for a letter reflecting this and basically making  the point that if he has to go back to the original office, he either has to  take medications all the time or change jobs.   PLAN:  1. We are scheduling pulmonary function test.  2. Flu vaccine.  3. Letter supporting  removal from the specific office.  4. Schedule return in six months, earlier p.r.n.     Clinton D. Maple Hudson, MD, Tonny Bollman, FACP  Electronically Signed    CDY/MedQ  DD: 07/16/2006  DT: 07/16/2006  Job #: [id number]

## 2011-01-15 NOTE — Letter (Signed)
August 26, 2006    Ms. Cherlyn Labella  Human Resources Department  [facility]  [address] [place]  [address]   RE:  RJ, PEDROSA  MRN:  [id number]  /  DOB:  16-Mar-1951   Dear Ms. Su Hilt,   I am the pulmonary physician caring for Mr. Novak Stgermaine. Segundo.  I shared  your note with him and have his permission to respond.  I have followed  him for over 20 years for asthma and allergic rhinitis, currently  managed with allergy vaccine injections, an anti-inflammatory medication  (Singulair 10 mg daily), and a bronchodilator inhaler (albuterol) used  up to four times daily when needed.  He has required other medications  in the past.  He associates a specific office space with a definite  increase in chest tightness, shortness of breath, and asthma symptoms.  Since he has been out of that space recently, he describes progressive  improvement in his breathing comfort, and he has been able to be off  sustained-action bronchodilator medications such as Advair.  We know Mr.  Mcgovern is an allergic individual, but we do not know specifically what  the aggravating factor associated with that particular office would be  and can only speculate that there might be a mold, dust, or particulate  irritant.  Recent pulmonary function tests on Mr. Kulzer have been in the  high-normal range as of December 10th, which is encouraging because it  indicates there is not sustained damage.  He has scored in this range  previously when I tested him several years ago.  I do not have easy ways  to be more specific about his condition.  In some situations, it is  practical to retest a person's pulmonary function scores after resuming  exposure to the work place in question.  Sometimes, it is more  appropriate to have the person evaluated from an occupational health  standpoint.  The easiest and most satisfactory response would be to  office him permanently in some place other than the  particular office he  identifies as the problem.  I do not know if that is realistic or  practical from the standpoint of his employer, or whether inspection by  an air quality person, a trial with an air cleaner at the work place, or  other similar interventions would be of any value.  Mr. Mccormac  appreciates the concerns of his employers.  The simplest solution, if  possible, would be to let him stay where he is now working since he  indicates he has felt much better there.    Sincerely,      Clinton D. Maple Hudson, MD, Tonny Bollman, FACP  Electronically Signed    CDY/MedQ  DD: 08/26/2006  DT: 08/26/2006  Job #: [id number]   CC:    Genene Churn. Luberta Robertson

## 2011-01-15 NOTE — Letter (Signed)
July 15, 2006    Mr. Viet Kemmerer. Laperle  [address]  [place], [place]  [address]   RE:  STEAVEN, WHOLEY  MRN:  [id number]  /  DOB:  04-01-1951   Dear Mr. Savitz:   I have followed you for many years for your asthma and allergic rhinitis.  You have given a pretty clear history that your respiratory complaints are  worse in one particular office space and improved when you can work  routinely in other locations.  You seem to have noted a fine black dust  associated with the problem office space in question.  Currently you are out  of that space and able to be off of your asthma medications.  It does seem  that if  you are to work there, you will require daily use of asthma  medications which are not required when you are able to work elsewhere.  I  hope your employer can find you alternative work space or have an air  quality evaluation done to reveal the nature of the aggravating factor.    Sincerely,      Clinton D. Maple Hudson, MD, Tonny Bollman, FACP  Electronically Signed    CDY/MedQ  DD: 07/16/2006  DT: 07/16/2006  Job #: [id number]

## 2011-01-19 ENCOUNTER — Ambulatory Visit (INDEPENDENT_AMBULATORY_CARE_PROVIDER_SITE_OTHER): Payer: 59

## 2011-01-19 DIAGNOSIS — J309 Allergic rhinitis, unspecified: Secondary | ICD-10-CM

## 2011-01-26 ENCOUNTER — Ambulatory Visit (INDEPENDENT_AMBULATORY_CARE_PROVIDER_SITE_OTHER): Payer: 59

## 2011-01-26 DIAGNOSIS — J309 Allergic rhinitis, unspecified: Secondary | ICD-10-CM

## 2011-02-01 ENCOUNTER — Encounter: Payer: Self-pay | Admitting: Internal Medicine

## 2011-02-02 ENCOUNTER — Encounter: Payer: Self-pay | Admitting: Internal Medicine

## 2011-02-02 ENCOUNTER — Ambulatory Visit (INDEPENDENT_AMBULATORY_CARE_PROVIDER_SITE_OTHER): Payer: 59

## 2011-02-02 DIAGNOSIS — J309 Allergic rhinitis, unspecified: Secondary | ICD-10-CM

## 2011-02-03 ENCOUNTER — Ambulatory Visit (INDEPENDENT_AMBULATORY_CARE_PROVIDER_SITE_OTHER): Payer: 59 | Admitting: Internal Medicine

## 2011-02-03 ENCOUNTER — Encounter: Payer: Self-pay | Admitting: Internal Medicine

## 2011-02-03 DIAGNOSIS — M25519 Pain in unspecified shoulder: Secondary | ICD-10-CM

## 2011-02-03 DIAGNOSIS — M25511 Pain in right shoulder: Secondary | ICD-10-CM

## 2011-02-03 MED ORDER — IBUPROFEN 600 MG PO TABS: ORAL_TABLET | ORAL | Status: DC

## 2011-02-03 NOTE — Progress Notes (Signed)
  Subjective:    Patient ID: John Boone, male    DOB: 04-17-1951, 60 y.o.   MRN: [id number]  HPI  C/o R shoulder pain - it is 25% better.   Review of Systems  Constitutional: Negative for activity change and fatigue.  Respiratory: Negative for cough, chest tightness and shortness of breath.   Musculoskeletal: Negative for back pain.  Psychiatric/Behavioral: Negative for dysphoric mood.       Objective:   Physical Exam  Constitutional: He is oriented to person, place, and time. He appears well-developed.  HENT:  Mouth/Throat: Oropharynx is clear and moist.  Eyes: Conjunctivae are normal. Pupils are equal, round, and reactive to light.  Neck: Normal range of motion. No JVD present. No thyromegaly present.  Cardiovascular: Normal rate, regular rhythm, normal heart sounds and intact distal pulses.  Exam reveals no gallop and no friction rub.   No murmur heard. Pulmonary/Chest: Effort normal and breath sounds normal. No respiratory distress. He has no wheezes. He has no rales. He exhibits no tenderness.  Abdominal: Soft. Bowel sounds are normal. He exhibits no distension and no mass. There is no tenderness. There is no rebound and no guarding.  Musculoskeletal: Normal range of motion. He exhibits tenderness (R lat shoulder is tender w/palp and abduction - less than before). He exhibits no edema.  Lymphadenopathy:    He has no cervical adenopathy.  Neurological: He is alert and oriented to person, place, and time. He has normal reflexes. No cranial nerve deficit. He exhibits normal muscle tone. Coordination normal.  Skin: Skin is warm and dry. No rash noted.  Psychiatric: His behavior is normal. Judgment normal.        Procedure Note :    Procedure :   Sonography examination R shoulder   Indication: R shoulder pain    Equipment used : Terason 3000 unit  With 12L5-V linear probe. The images were stored in Terason.  The patient was placed in a decubitus position.   This study  revealed slight irregularities in supraspinatus tendon. No effusion.   Impression: R supraspinatus strain     Assessment & Plan:

## 2011-02-03 NOTE — Patient Instructions (Signed)
Come for a shoulder injection if not well

## 2011-02-09 ENCOUNTER — Ambulatory Visit (INDEPENDENT_AMBULATORY_CARE_PROVIDER_SITE_OTHER): Payer: 59

## 2011-02-09 DIAGNOSIS — J309 Allergic rhinitis, unspecified: Secondary | ICD-10-CM

## 2011-02-16 ENCOUNTER — Ambulatory Visit (INDEPENDENT_AMBULATORY_CARE_PROVIDER_SITE_OTHER): Payer: 59

## 2011-02-16 DIAGNOSIS — J309 Allergic rhinitis, unspecified: Secondary | ICD-10-CM

## 2011-02-21 ENCOUNTER — Encounter: Payer: Self-pay | Admitting: Internal Medicine

## 2011-02-21 NOTE — Assessment & Plan Note (Signed)
He declined injection, Xray He will cont w/current Rx

## 2011-02-23 ENCOUNTER — Ambulatory Visit (INDEPENDENT_AMBULATORY_CARE_PROVIDER_SITE_OTHER): Payer: 59

## 2011-02-23 DIAGNOSIS — J309 Allergic rhinitis, unspecified: Secondary | ICD-10-CM

## 2011-03-02 ENCOUNTER — Ambulatory Visit (INDEPENDENT_AMBULATORY_CARE_PROVIDER_SITE_OTHER): Payer: 59

## 2011-03-02 DIAGNOSIS — J309 Allergic rhinitis, unspecified: Secondary | ICD-10-CM

## 2011-03-04 ENCOUNTER — Ambulatory Visit (INDEPENDENT_AMBULATORY_CARE_PROVIDER_SITE_OTHER): Payer: 59

## 2011-03-04 DIAGNOSIS — J309 Allergic rhinitis, unspecified: Secondary | ICD-10-CM

## 2011-03-09 ENCOUNTER — Ambulatory Visit (INDEPENDENT_AMBULATORY_CARE_PROVIDER_SITE_OTHER): Payer: 59

## 2011-03-09 DIAGNOSIS — J309 Allergic rhinitis, unspecified: Secondary | ICD-10-CM

## 2011-03-16 ENCOUNTER — Ambulatory Visit (INDEPENDENT_AMBULATORY_CARE_PROVIDER_SITE_OTHER): Payer: 59

## 2011-03-16 DIAGNOSIS — J309 Allergic rhinitis, unspecified: Secondary | ICD-10-CM

## 2011-03-23 ENCOUNTER — Ambulatory Visit (INDEPENDENT_AMBULATORY_CARE_PROVIDER_SITE_OTHER): Payer: 59

## 2011-03-23 DIAGNOSIS — J309 Allergic rhinitis, unspecified: Secondary | ICD-10-CM

## 2011-03-30 ENCOUNTER — Ambulatory Visit (INDEPENDENT_AMBULATORY_CARE_PROVIDER_SITE_OTHER): Payer: 59

## 2011-03-30 ENCOUNTER — Encounter: Payer: Self-pay | Admitting: Internal Medicine

## 2011-03-30 DIAGNOSIS — J309 Allergic rhinitis, unspecified: Secondary | ICD-10-CM

## 2011-04-06 ENCOUNTER — Ambulatory Visit (INDEPENDENT_AMBULATORY_CARE_PROVIDER_SITE_OTHER): Payer: 59

## 2011-04-06 DIAGNOSIS — J309 Allergic rhinitis, unspecified: Secondary | ICD-10-CM

## 2011-04-13 ENCOUNTER — Ambulatory Visit (INDEPENDENT_AMBULATORY_CARE_PROVIDER_SITE_OTHER): Payer: 59

## 2011-04-13 DIAGNOSIS — J309 Allergic rhinitis, unspecified: Secondary | ICD-10-CM

## 2011-04-20 ENCOUNTER — Ambulatory Visit (INDEPENDENT_AMBULATORY_CARE_PROVIDER_SITE_OTHER): Payer: 59

## 2011-04-20 DIAGNOSIS — J309 Allergic rhinitis, unspecified: Secondary | ICD-10-CM

## 2011-04-27 ENCOUNTER — Ambulatory Visit (INDEPENDENT_AMBULATORY_CARE_PROVIDER_SITE_OTHER): Payer: 59

## 2011-04-27 DIAGNOSIS — J309 Allergic rhinitis, unspecified: Secondary | ICD-10-CM

## 2011-05-04 ENCOUNTER — Ambulatory Visit (INDEPENDENT_AMBULATORY_CARE_PROVIDER_SITE_OTHER): Payer: 59

## 2011-05-04 DIAGNOSIS — J309 Allergic rhinitis, unspecified: Secondary | ICD-10-CM

## 2011-05-11 ENCOUNTER — Ambulatory Visit (INDEPENDENT_AMBULATORY_CARE_PROVIDER_SITE_OTHER): Payer: 59

## 2011-05-11 DIAGNOSIS — J309 Allergic rhinitis, unspecified: Secondary | ICD-10-CM

## 2011-05-19 ENCOUNTER — Ambulatory Visit (INDEPENDENT_AMBULATORY_CARE_PROVIDER_SITE_OTHER): Payer: 59

## 2011-05-19 DIAGNOSIS — J309 Allergic rhinitis, unspecified: Secondary | ICD-10-CM

## 2011-05-25 ENCOUNTER — Ambulatory Visit (INDEPENDENT_AMBULATORY_CARE_PROVIDER_SITE_OTHER): Payer: 59

## 2011-05-25 DIAGNOSIS — J309 Allergic rhinitis, unspecified: Secondary | ICD-10-CM

## 2011-06-01 ENCOUNTER — Ambulatory Visit (INDEPENDENT_AMBULATORY_CARE_PROVIDER_SITE_OTHER): Payer: 59

## 2011-06-01 DIAGNOSIS — J309 Allergic rhinitis, unspecified: Secondary | ICD-10-CM

## 2011-06-08 ENCOUNTER — Ambulatory Visit (INDEPENDENT_AMBULATORY_CARE_PROVIDER_SITE_OTHER): Payer: 59

## 2011-06-08 DIAGNOSIS — J309 Allergic rhinitis, unspecified: Secondary | ICD-10-CM

## 2011-06-08 DIAGNOSIS — Z23 Encounter for immunization: Secondary | ICD-10-CM

## 2011-06-15 ENCOUNTER — Ambulatory Visit (INDEPENDENT_AMBULATORY_CARE_PROVIDER_SITE_OTHER): Payer: 59

## 2011-06-15 DIAGNOSIS — J309 Allergic rhinitis, unspecified: Secondary | ICD-10-CM

## 2011-06-22 ENCOUNTER — Ambulatory Visit (INDEPENDENT_AMBULATORY_CARE_PROVIDER_SITE_OTHER): Payer: 59

## 2011-06-22 DIAGNOSIS — J309 Allergic rhinitis, unspecified: Secondary | ICD-10-CM

## 2011-06-25 ENCOUNTER — Encounter: Payer: Self-pay | Admitting: Internal Medicine

## 2011-06-29 ENCOUNTER — Ambulatory Visit (INDEPENDENT_AMBULATORY_CARE_PROVIDER_SITE_OTHER): Payer: 59

## 2011-06-29 DIAGNOSIS — J309 Allergic rhinitis, unspecified: Secondary | ICD-10-CM

## 2011-06-30 ENCOUNTER — Ambulatory Visit (INDEPENDENT_AMBULATORY_CARE_PROVIDER_SITE_OTHER): Payer: 59

## 2011-06-30 DIAGNOSIS — J309 Allergic rhinitis, unspecified: Secondary | ICD-10-CM

## 2011-07-07 ENCOUNTER — Ambulatory Visit (INDEPENDENT_AMBULATORY_CARE_PROVIDER_SITE_OTHER): Payer: 59

## 2011-07-07 DIAGNOSIS — J309 Allergic rhinitis, unspecified: Secondary | ICD-10-CM

## 2011-07-13 ENCOUNTER — Ambulatory Visit (INDEPENDENT_AMBULATORY_CARE_PROVIDER_SITE_OTHER): Payer: 59

## 2011-07-13 DIAGNOSIS — J309 Allergic rhinitis, unspecified: Secondary | ICD-10-CM

## 2011-07-20 ENCOUNTER — Ambulatory Visit (INDEPENDENT_AMBULATORY_CARE_PROVIDER_SITE_OTHER): Payer: 59

## 2011-07-20 DIAGNOSIS — J309 Allergic rhinitis, unspecified: Secondary | ICD-10-CM

## 2011-07-27 ENCOUNTER — Ambulatory Visit (INDEPENDENT_AMBULATORY_CARE_PROVIDER_SITE_OTHER): Payer: 59

## 2011-07-27 DIAGNOSIS — J309 Allergic rhinitis, unspecified: Secondary | ICD-10-CM

## 2011-08-02 ENCOUNTER — Other Ambulatory Visit: Payer: Self-pay | Admitting: Internal Medicine

## 2011-08-03 ENCOUNTER — Ambulatory Visit (INDEPENDENT_AMBULATORY_CARE_PROVIDER_SITE_OTHER): Payer: 59

## 2011-08-03 DIAGNOSIS — J309 Allergic rhinitis, unspecified: Secondary | ICD-10-CM

## 2011-08-11 ENCOUNTER — Ambulatory Visit (INDEPENDENT_AMBULATORY_CARE_PROVIDER_SITE_OTHER): Payer: 59

## 2011-08-11 DIAGNOSIS — J309 Allergic rhinitis, unspecified: Secondary | ICD-10-CM

## 2011-08-16 ENCOUNTER — Telehealth: Payer: Self-pay | Admitting: *Deleted

## 2011-08-16 DIAGNOSIS — Z0000 Encounter for general adult medical examination without abnormal findings: Secondary | ICD-10-CM

## 2011-08-16 DIAGNOSIS — Z0389 Encounter for observation for other suspected diseases and conditions ruled out: Secondary | ICD-10-CM

## 2011-08-16 NOTE — Telephone Encounter (Signed)
Labs entered.

## 2011-08-16 NOTE — Telephone Encounter (Signed)
Message copied by Merrilyn Puma on Mon Aug 16, 2011  8:30 AM ------      Message from: COUSIN, [place] T      Created: Tue Jul 27, 2011  9:56 AM      Regarding: PHY DATE:  09/23/11       THANKS

## 2011-08-17 ENCOUNTER — Ambulatory Visit (INDEPENDENT_AMBULATORY_CARE_PROVIDER_SITE_OTHER): Payer: 59

## 2011-08-17 DIAGNOSIS — J309 Allergic rhinitis, unspecified: Secondary | ICD-10-CM

## 2011-08-26 ENCOUNTER — Ambulatory Visit (INDEPENDENT_AMBULATORY_CARE_PROVIDER_SITE_OTHER): Payer: 59

## 2011-08-26 DIAGNOSIS — J309 Allergic rhinitis, unspecified: Secondary | ICD-10-CM

## 2011-09-01 ENCOUNTER — Ambulatory Visit (INDEPENDENT_AMBULATORY_CARE_PROVIDER_SITE_OTHER): Payer: 59

## 2011-09-01 DIAGNOSIS — J309 Allergic rhinitis, unspecified: Secondary | ICD-10-CM

## 2011-09-07 ENCOUNTER — Ambulatory Visit (INDEPENDENT_AMBULATORY_CARE_PROVIDER_SITE_OTHER): Payer: 59

## 2011-09-07 DIAGNOSIS — J309 Allergic rhinitis, unspecified: Secondary | ICD-10-CM

## 2011-09-14 ENCOUNTER — Ambulatory Visit (INDEPENDENT_AMBULATORY_CARE_PROVIDER_SITE_OTHER): Payer: 59 | Admitting: Internal Medicine

## 2011-09-14 ENCOUNTER — Ambulatory Visit (INDEPENDENT_AMBULATORY_CARE_PROVIDER_SITE_OTHER): Payer: 59

## 2011-09-14 ENCOUNTER — Encounter: Payer: Self-pay | Admitting: Internal Medicine

## 2011-09-14 VITALS — BP 122/84 | HR 80 | Ht 74.0 in | Wt 219.8 lb

## 2011-09-14 DIAGNOSIS — I4891 Unspecified atrial fibrillation: Secondary | ICD-10-CM

## 2011-09-14 DIAGNOSIS — J301 Allergic rhinitis due to pollen: Secondary | ICD-10-CM

## 2011-09-14 DIAGNOSIS — J309 Allergic rhinitis, unspecified: Secondary | ICD-10-CM

## 2011-09-14 DIAGNOSIS — J45909 Unspecified asthma, uncomplicated: Secondary | ICD-10-CM

## 2011-09-14 MED ORDER — MOMETASONE FUROATE 50 MCG/ACT NA SUSP: 2.0000 | Freq: Every day | NASAL | Status: DC

## 2011-09-14 MED ORDER — ALBUTEROL SULFATE HFA 108 (90 BASE) MCG/ACT IN AERS: 2.0000 | INHALATION_SPRAY | Freq: Four times a day (QID) | RESPIRATORY_TRACT | Status: DC | PRN

## 2011-09-14 NOTE — Patient Instructions (Signed)
Refill rescue inhaler- you can ask the pharmacy to put it on hold till you need it  Sample and Script nasonex nasal steroid spray---   Use 2 puffs each nostril every day as a maintenance medicine when needed.   You can still use the decongestant and antihistamine as needed

## 2011-09-14 NOTE — Progress Notes (Signed)
09/14/11- 60 yoM former smoker, followed for asthma, allergic rhinitis, complicated by AFib/PAT,  LOV-09/15/10 At work he is exposed to secondhand smoke which makes him sneeze. He has done well with maintenance allergy vaccine without need to change. In the last few weeks has resumed loratadine and Sudafed, questioning mold from wet ground. No asthma and a very rare need for rescue inhaler.  ROS-see HPI Constitutional:   No-   weight loss, night sweats, fevers, chills, fatigue, lassitude. HEENT:   No-  headaches, difficulty swallowing, tooth/dental problems, sore throat,       No-  sneezing, itching, ear ache,   + mildnasal congestion, post nasal drip,  CV:  No-   chest pain, orthopnea, PND, swelling in lower extremities, anasarca,  dizziness, palpitations Resp: No-   shortness of breath with exertion or at rest.              No-   productive cough,  No non-productive cough,  No- coughing up of blood.              No-   change in color of mucus.  No- wheezing.   Skin: No-   rash or lesions. GI:  No-   heartburn, indigestion, abdominal pain, nausea, vomiting, diarrhea,                 change in bowel habits, loss of appetite GU: MS:  No-   joint pain or swelling.  No- decreased range of motion.  No- back pain. Neuro-     nothing unusual Psych:  No- change in mood or affect. No depression or anxiety.  No memory loss.  OBJ General- Alert, Oriented, Affect-appropriate, Distress- none acute Skin- rash-none, lesions- none, excoriation- none. Chronic rhinophyma appearance Lymphadenopathy- none Head- atraumatic            Eyes- Gross vision intact, PERRLA, conjunctivae clear secretions            Ears- Hearing, canals-normal            Nose- turbinate edema, no-Septal dev, mucus, polyps, erosion, perforation             Throat- Mallampati II , mucosa clear , drainage- none, tonsils- atrophic Neck- flexible , trachea midline, no stridor , thyroid nl, carotid no bruit Chest - symmetrical excursion  , unlabored           Heart/CV- RRR , no murmur , no gallop  , no rub, nl s1 s2                           - JVD- none , edema- none, stasis changes- none, varices- none           Lung- clear to P&A, wheeze- none, cough- none , dullness-none, rub- none           Chest wall-  Abd-Br/ Gen/ Rectal- Not done, not indicated Extrem- cyanosis- none, clubbing, none, atrophy- none, strength- nl Neuro- grossly intact to observation

## 2011-09-15 ENCOUNTER — Encounter: Payer: Self-pay | Admitting: Internal Medicine

## 2011-09-16 ENCOUNTER — Other Ambulatory Visit (INDEPENDENT_AMBULATORY_CARE_PROVIDER_SITE_OTHER): Payer: 59

## 2011-09-16 DIAGNOSIS — Z0000 Encounter for general adult medical examination without abnormal findings: Secondary | ICD-10-CM

## 2011-09-16 DIAGNOSIS — Z0389 Encounter for observation for other suspected diseases and conditions ruled out: Secondary | ICD-10-CM

## 2011-09-16 LAB — CBC WITH DIFFERENTIAL/PLATELET
Basophils Absolute: 0 10*3/uL (ref 0.0–0.1)
Eosinophils Absolute: 0.6 10*3/uL (ref 0.0–0.7)
Hemoglobin: 16.1 g/dL (ref 13.0–17.0)
Lymphocytes Relative: 27.3 % (ref 12.0–46.0)
MCHC: 34.3 g/dL (ref 30.0–36.0)
Monocytes Absolute: 0.5 10*3/uL (ref 0.1–1.0)
Neutro Abs: 4.3 10*3/uL (ref 1.4–7.7)
Neutrophils Relative %: 58.3 % (ref 43.0–77.0)
RDW: 13.6 % (ref 11.5–14.6)

## 2011-09-16 LAB — URINALYSIS, ROUTINE W REFLEX MICROSCOPIC
Hgb urine dipstick: NEGATIVE
Nitrite: NEGATIVE
Urobilinogen, UA: 0.2 (ref 0.0–1.0)

## 2011-09-16 LAB — HEPATIC FUNCTION PANEL
Albumin: 4.2 g/dL (ref 3.5–5.2)
Bilirubin, Direct: 0.1 mg/dL (ref 0.0–0.3)
Total Protein: 6.9 g/dL (ref 6.0–8.3)

## 2011-09-16 LAB — BASIC METABOLIC PANEL
CO2: 27 mEq/L (ref 19–32)
Calcium: 9.4 mg/dL (ref 8.4–10.5)
Chloride: 102 mEq/L (ref 96–112)
Glucose, Bld: 84 mg/dL (ref 70–99)
Sodium: 139 mEq/L (ref 135–145)

## 2011-09-16 LAB — PSA: PSA: 0.74 ng/mL (ref 0.10–4.00)

## 2011-09-16 LAB — LIPID PANEL: HDL: 50.4 mg/dL (ref >39.00)

## 2011-09-16 NOTE — Assessment & Plan Note (Signed)
Exam feels like sinus rhythm today.

## 2011-09-16 NOTE — Assessment & Plan Note (Signed)
He has continued to do well and feels he is better off with allergy vaccine than without.

## 2011-09-16 NOTE — Assessment & Plan Note (Signed)
Very well controlled with rare need for rescue inhaler.

## 2011-09-21 ENCOUNTER — Ambulatory Visit (INDEPENDENT_AMBULATORY_CARE_PROVIDER_SITE_OTHER): Payer: 59

## 2011-09-21 DIAGNOSIS — J309 Allergic rhinitis, unspecified: Secondary | ICD-10-CM

## 2011-09-23 ENCOUNTER — Encounter: Payer: Self-pay | Admitting: Internal Medicine

## 2011-09-23 ENCOUNTER — Ambulatory Visit (INDEPENDENT_AMBULATORY_CARE_PROVIDER_SITE_OTHER): Payer: 59 | Admitting: Internal Medicine

## 2011-09-23 VITALS — BP 148/90 | HR 80 | Temp 97.4°F | Resp 16 | Ht 74.0 in | Wt 219.2 lb

## 2011-09-23 DIAGNOSIS — L719 Rosacea, unspecified: Secondary | ICD-10-CM

## 2011-09-23 DIAGNOSIS — I10 Essential (primary) hypertension: Secondary | ICD-10-CM

## 2011-09-23 DIAGNOSIS — Z0000 Encounter for general adult medical examination without abnormal findings: Secondary | ICD-10-CM | POA: Insufficient documentation

## 2011-09-23 DIAGNOSIS — Z136 Encounter for screening for cardiovascular disorders: Secondary | ICD-10-CM

## 2011-09-23 DIAGNOSIS — J45909 Unspecified asthma, uncomplicated: Secondary | ICD-10-CM

## 2011-09-23 DIAGNOSIS — R002 Palpitations: Secondary | ICD-10-CM

## 2011-09-23 DIAGNOSIS — I4891 Unspecified atrial fibrillation: Secondary | ICD-10-CM

## 2011-09-23 MED ORDER — METRONIDAZOLE 1 % EX GEL: Freq: Every day | CUTANEOUS | Status: DC

## 2011-09-23 MED ORDER — METOPROLOL SUCCINATE ER 50 MG PO TB24: 50.0000 mg | ORAL_TABLET | Freq: Every day | ORAL | Status: DC

## 2011-09-23 MED ORDER — DOXYCYCLINE HYCLATE 100 MG PO TABS: 100.0000 mg | ORAL_TABLET | Freq: Every day | ORAL | Status: DC

## 2011-09-23 NOTE — Assessment & Plan Note (Signed)
Continue with current prescription therapy as reflected on the Med list.  

## 2011-09-23 NOTE — Assessment & Plan Note (Signed)
Controlled w/ meds.

## 2011-09-23 NOTE — Assessment & Plan Note (Addendum)
Continue with current prescription therapy as reflected on the Med list. prn 

## 2011-09-23 NOTE — Assessment & Plan Note (Signed)
BP Readings from Last 3 Encounters:  09/23/11 148/90  09/14/11 122/84  02/03/11 130/90  Continue with current prescription therapy as reflected on the Med list. Monitor BP

## 2011-09-23 NOTE — Progress Notes (Signed)
  Subjective:    Patient ID: John Boone, male    DOB: 1950-11-17, 61 y.o.   MRN: [id number]  HPI  The patient is here for a wellness exam. The patient has been doing well overall without major physical or psychological issues going on latelyReview of Systems  Constitutional: Negative for appetite change, fatigue and unexpected weight change.  HENT: Negative for nosebleeds, congestion, sore throat, sneezing, trouble swallowing and neck pain.   Eyes: Negative for itching and visual disturbance.  Respiratory: Negative for cough.   Cardiovascular: Negative for chest pain, palpitations and leg swelling.  Gastrointestinal: Negative for nausea, diarrhea, blood in stool and abdominal distention.  Genitourinary: Negative for frequency and hematuria.  Musculoskeletal: Negative for back pain, joint swelling and gait problem.  Skin: Positive for rash.  Neurological: Negative for dizziness, tremors, speech difficulty and weakness.  Psychiatric/Behavioral: Negative for sleep disturbance, dysphoric mood and agitation. The patient is not nervous/anxious.        Objective:   Physical Exam  Constitutional: He is oriented to person, place, and time. He appears well-developed.  HENT:  Mouth/Throat: Oropharynx is clear and moist.  Eyes: Conjunctivae are normal. Pupils are equal, round, and reactive to light.  Neck: Normal range of motion. No JVD present. No thyromegaly present.  Cardiovascular: Normal rate, regular rhythm, normal heart sounds and intact distal pulses.  Exam reveals no gallop and no friction rub.   No murmur heard. Pulmonary/Chest: Effort normal and breath sounds normal. No respiratory distress. He has no wheezes. He has no rales. He exhibits no tenderness.  Abdominal: Soft. Bowel sounds are normal. He exhibits no distension and no mass. There is no tenderness. There is no rebound and no guarding.  Genitourinary: Prostate normal. Guaiac negative stool. No penile tenderness.    Musculoskeletal: Normal range of motion. He exhibits no edema and no tenderness.  Lymphadenopathy:    He has no cervical adenopathy.  Neurological: He is alert and oriented to person, place, and time. He has normal reflexes. No cranial nerve deficit. He exhibits normal muscle tone. Coordination normal.  Skin: Skin is warm and dry. Rash (rozacea) noted.  Psychiatric: He has a normal mood and affect. His behavior is normal. Judgment and thought content normal.   Lab Results  Component Value Date   WBC 7.4 09/16/2011   HGB 16.1 09/16/2011   HCT 46.9 09/16/2011   PLT 228.0 09/16/2011   GLUCOSE 84 09/16/2011   CHOL 190 09/16/2011   TRIG 90.0 09/16/2011   HDL 50.40 09/16/2011   LDLCALC 122* 09/16/2011   ALT 21 09/16/2011   AST 19 09/16/2011   NA 139 09/16/2011   K 5.0 09/16/2011   CL 102 09/16/2011   CREATININE 1.0 09/16/2011   BUN 19 09/16/2011   CO2 27 09/16/2011   TSH 0.84 09/16/2011   PSA 0.74 09/16/2011          Assessment & Plan:

## 2011-09-23 NOTE — Assessment & Plan Note (Addendum)
We discussed age appropriate health related issues, including available/recomended screening tests and vaccinations. We discussed a need for adhering to healthy diet and exercise. Labs/EKG were reviewed/ordered. All questions were answered. Colon due 2014 Zostavax discussed

## 2011-09-28 ENCOUNTER — Ambulatory Visit (INDEPENDENT_AMBULATORY_CARE_PROVIDER_SITE_OTHER): Payer: 59

## 2011-09-28 DIAGNOSIS — J309 Allergic rhinitis, unspecified: Secondary | ICD-10-CM

## 2011-10-05 ENCOUNTER — Ambulatory Visit (INDEPENDENT_AMBULATORY_CARE_PROVIDER_SITE_OTHER): Payer: 59

## 2011-10-05 DIAGNOSIS — J309 Allergic rhinitis, unspecified: Secondary | ICD-10-CM

## 2011-10-12 ENCOUNTER — Ambulatory Visit (INDEPENDENT_AMBULATORY_CARE_PROVIDER_SITE_OTHER): Payer: 59

## 2011-10-12 DIAGNOSIS — J309 Allergic rhinitis, unspecified: Secondary | ICD-10-CM

## 2011-10-19 ENCOUNTER — Ambulatory Visit (INDEPENDENT_AMBULATORY_CARE_PROVIDER_SITE_OTHER): Payer: 59

## 2011-10-19 DIAGNOSIS — J309 Allergic rhinitis, unspecified: Secondary | ICD-10-CM

## 2011-10-26 ENCOUNTER — Ambulatory Visit (INDEPENDENT_AMBULATORY_CARE_PROVIDER_SITE_OTHER): Payer: 59

## 2011-10-26 DIAGNOSIS — J309 Allergic rhinitis, unspecified: Secondary | ICD-10-CM

## 2011-10-28 ENCOUNTER — Ambulatory Visit (INDEPENDENT_AMBULATORY_CARE_PROVIDER_SITE_OTHER): Payer: 59

## 2011-10-28 DIAGNOSIS — J309 Allergic rhinitis, unspecified: Secondary | ICD-10-CM

## 2011-11-02 ENCOUNTER — Ambulatory Visit (INDEPENDENT_AMBULATORY_CARE_PROVIDER_SITE_OTHER): Payer: 59

## 2011-11-02 DIAGNOSIS — J309 Allergic rhinitis, unspecified: Secondary | ICD-10-CM

## 2011-11-09 ENCOUNTER — Ambulatory Visit (INDEPENDENT_AMBULATORY_CARE_PROVIDER_SITE_OTHER): Payer: 59

## 2011-11-09 DIAGNOSIS — J309 Allergic rhinitis, unspecified: Secondary | ICD-10-CM

## 2011-11-16 ENCOUNTER — Ambulatory Visit (INDEPENDENT_AMBULATORY_CARE_PROVIDER_SITE_OTHER): Payer: 59

## 2011-11-16 DIAGNOSIS — J309 Allergic rhinitis, unspecified: Secondary | ICD-10-CM

## 2011-11-24 ENCOUNTER — Ambulatory Visit (INDEPENDENT_AMBULATORY_CARE_PROVIDER_SITE_OTHER): Payer: 59

## 2011-11-24 DIAGNOSIS — J309 Allergic rhinitis, unspecified: Secondary | ICD-10-CM

## 2011-11-30 ENCOUNTER — Ambulatory Visit (INDEPENDENT_AMBULATORY_CARE_PROVIDER_SITE_OTHER): Payer: 59

## 2011-11-30 DIAGNOSIS — J309 Allergic rhinitis, unspecified: Secondary | ICD-10-CM

## 2011-12-07 ENCOUNTER — Ambulatory Visit (INDEPENDENT_AMBULATORY_CARE_PROVIDER_SITE_OTHER): Payer: 59

## 2011-12-07 DIAGNOSIS — J309 Allergic rhinitis, unspecified: Secondary | ICD-10-CM

## 2011-12-14 ENCOUNTER — Ambulatory Visit (INDEPENDENT_AMBULATORY_CARE_PROVIDER_SITE_OTHER): Payer: 59

## 2011-12-14 DIAGNOSIS — J309 Allergic rhinitis, unspecified: Secondary | ICD-10-CM

## 2011-12-17 ENCOUNTER — Encounter: Payer: Self-pay | Admitting: Internal Medicine

## 2011-12-21 ENCOUNTER — Ambulatory Visit (INDEPENDENT_AMBULATORY_CARE_PROVIDER_SITE_OTHER): Payer: 59

## 2011-12-21 DIAGNOSIS — J309 Allergic rhinitis, unspecified: Secondary | ICD-10-CM

## 2011-12-28 ENCOUNTER — Ambulatory Visit (INDEPENDENT_AMBULATORY_CARE_PROVIDER_SITE_OTHER): Payer: 59

## 2011-12-28 DIAGNOSIS — J309 Allergic rhinitis, unspecified: Secondary | ICD-10-CM

## 2012-01-05 ENCOUNTER — Ambulatory Visit (INDEPENDENT_AMBULATORY_CARE_PROVIDER_SITE_OTHER): Payer: 59

## 2012-01-05 DIAGNOSIS — J309 Allergic rhinitis, unspecified: Secondary | ICD-10-CM

## 2012-01-11 ENCOUNTER — Ambulatory Visit (INDEPENDENT_AMBULATORY_CARE_PROVIDER_SITE_OTHER): Payer: 59

## 2012-01-11 DIAGNOSIS — J309 Allergic rhinitis, unspecified: Secondary | ICD-10-CM

## 2012-01-18 ENCOUNTER — Ambulatory Visit (INDEPENDENT_AMBULATORY_CARE_PROVIDER_SITE_OTHER): Payer: 59

## 2012-01-18 DIAGNOSIS — J309 Allergic rhinitis, unspecified: Secondary | ICD-10-CM

## 2012-01-25 ENCOUNTER — Ambulatory Visit (INDEPENDENT_AMBULATORY_CARE_PROVIDER_SITE_OTHER): Payer: 59

## 2012-01-25 DIAGNOSIS — J309 Allergic rhinitis, unspecified: Secondary | ICD-10-CM

## 2012-02-01 ENCOUNTER — Ambulatory Visit (INDEPENDENT_AMBULATORY_CARE_PROVIDER_SITE_OTHER): Payer: 59

## 2012-02-01 DIAGNOSIS — J309 Allergic rhinitis, unspecified: Secondary | ICD-10-CM

## 2012-02-08 ENCOUNTER — Ambulatory Visit (INDEPENDENT_AMBULATORY_CARE_PROVIDER_SITE_OTHER): Payer: 59

## 2012-02-08 DIAGNOSIS — J309 Allergic rhinitis, unspecified: Secondary | ICD-10-CM

## 2012-02-15 ENCOUNTER — Ambulatory Visit (INDEPENDENT_AMBULATORY_CARE_PROVIDER_SITE_OTHER): Payer: 59

## 2012-02-15 DIAGNOSIS — J309 Allergic rhinitis, unspecified: Secondary | ICD-10-CM

## 2012-02-22 ENCOUNTER — Ambulatory Visit (INDEPENDENT_AMBULATORY_CARE_PROVIDER_SITE_OTHER): Payer: 59

## 2012-02-22 DIAGNOSIS — J309 Allergic rhinitis, unspecified: Secondary | ICD-10-CM

## 2012-02-29 ENCOUNTER — Ambulatory Visit (INDEPENDENT_AMBULATORY_CARE_PROVIDER_SITE_OTHER): Payer: 59

## 2012-02-29 DIAGNOSIS — J309 Allergic rhinitis, unspecified: Secondary | ICD-10-CM

## 2012-03-01 ENCOUNTER — Ambulatory Visit (INDEPENDENT_AMBULATORY_CARE_PROVIDER_SITE_OTHER): Payer: 59

## 2012-03-01 DIAGNOSIS — J309 Allergic rhinitis, unspecified: Secondary | ICD-10-CM

## 2012-03-07 ENCOUNTER — Ambulatory Visit (INDEPENDENT_AMBULATORY_CARE_PROVIDER_SITE_OTHER): Payer: 59

## 2012-03-07 DIAGNOSIS — J309 Allergic rhinitis, unspecified: Secondary | ICD-10-CM

## 2012-03-11 ENCOUNTER — Other Ambulatory Visit: Payer: Self-pay | Admitting: Internal Medicine

## 2012-03-13 NOTE — Telephone Encounter (Signed)
Ok to Rf? 

## 2012-03-14 ENCOUNTER — Ambulatory Visit (INDEPENDENT_AMBULATORY_CARE_PROVIDER_SITE_OTHER): Payer: 59

## 2012-03-14 DIAGNOSIS — J309 Allergic rhinitis, unspecified: Secondary | ICD-10-CM

## 2012-03-21 ENCOUNTER — Ambulatory Visit (INDEPENDENT_AMBULATORY_CARE_PROVIDER_SITE_OTHER): Payer: 59

## 2012-03-21 ENCOUNTER — Encounter: Payer: Self-pay | Admitting: Internal Medicine

## 2012-03-21 DIAGNOSIS — J309 Allergic rhinitis, unspecified: Secondary | ICD-10-CM

## 2012-03-22 ENCOUNTER — Ambulatory Visit (INDEPENDENT_AMBULATORY_CARE_PROVIDER_SITE_OTHER): Payer: 59 | Admitting: Internal Medicine

## 2012-03-22 ENCOUNTER — Encounter: Payer: Self-pay | Admitting: Internal Medicine

## 2012-03-22 VITALS — BP 190/120 | HR 80 | Temp 97.6°F | Resp 16 | Wt 212.0 lb

## 2012-03-22 DIAGNOSIS — J45909 Unspecified asthma, uncomplicated: Secondary | ICD-10-CM

## 2012-03-22 DIAGNOSIS — L719 Rosacea, unspecified: Secondary | ICD-10-CM

## 2012-03-22 DIAGNOSIS — I10 Essential (primary) hypertension: Secondary | ICD-10-CM

## 2012-03-22 DIAGNOSIS — R002 Palpitations: Secondary | ICD-10-CM

## 2012-03-22 MED ORDER — DOXYCYCLINE HYCLATE 100 MG PO TABS: 100.0000 mg | ORAL_TABLET | Freq: Two times a day (BID) | ORAL | Status: DC

## 2012-03-22 MED ORDER — LOSARTAN POTASSIUM 100 MG PO TABS: 100.0000 mg | ORAL_TABLET | Freq: Every day | ORAL | Status: DC

## 2012-03-22 NOTE — Progress Notes (Signed)
   Subjective:    Patient ID: John Boone, male    DOB: [birth year], 61 y.o.   MRN: [id number]  HPI  The patient presents for a follow-up of  chronic hypertension, chronic palpitations, rosacea controlled with medicines. BP has been a little up at home - SBP is never above 160...  BP Readings from Last 3 Encounters:  03/22/12 190/120  09/23/11 148/90  09/14/11 122/84   Wt Readings from Last 3 Encounters:  03/22/12 212 lb (96.163 kg)  09/23/11 219 lb 3.2 oz (99.428 kg)  09/14/11 219 lb 12.8 oz (99.701 kg)       yReview of Systems  Constitutional: Negative for appetite change, fatigue and unexpected weight change.  HENT: Negative for nosebleeds, congestion, sore throat, sneezing, trouble swallowing and neck pain.   Eyes: Negative for itching and visual disturbance.  Respiratory: Negative for cough.   Cardiovascular: Negative for chest pain, palpitations and leg swelling.  Gastrointestinal: Negative for nausea, diarrhea, blood in stool and abdominal distention.  Genitourinary: Negative for frequency and hematuria.  Musculoskeletal: Negative for back pain, joint swelling and gait problem.  Skin: Positive for rash.  Neurological: Negative for dizziness, tremors, speech difficulty and weakness.  Psychiatric/Behavioral: Negative for disturbed wake/sleep cycle, dysphoric mood and agitation. The patient is not nervous/anxious.        Objective:   Physical Exam  Constitutional: He is oriented to person, place, and time. He appears well-developed.  HENT:  Mouth/Throat: Oropharynx is clear and moist.  Eyes: Conjunctivae are normal. Pupils are equal, round, and reactive to light.  Neck: Normal range of motion. No JVD present. No thyromegaly present.  Cardiovascular: Normal rate, regular rhythm, normal heart sounds and intact distal pulses.  Exam reveals no gallop and no friction rub.   No murmur heard. Pulmonary/Chest: Effort normal and breath sounds normal. No respiratory distress.  He has no wheezes. He has no rales. He exhibits no tenderness.  Abdominal: Soft. Bowel sounds are normal. He exhibits no distension and no mass. There is no tenderness. There is no rebound and no guarding.  Genitourinary: Prostate normal. Guaiac negative stool. No penile tenderness.  Musculoskeletal: Normal range of motion. He exhibits no edema and no tenderness.  Lymphadenopathy:    He has no cervical adenopathy.  Neurological: He is alert and oriented to person, place, and time. He has normal reflexes. No cranial nerve deficit. He exhibits normal muscle tone. Coordination normal.  Skin: Skin is warm and dry. Rash (rozacea) noted.  Psychiatric: He has a normal mood and affect. His behavior is normal. Judgment and thought content normal.   Lab Results  Component Value Date   WBC 7.4 09/16/2011   HGB 16.1 09/16/2011   HCT 46.9 09/16/2011   PLT 228.0 09/16/2011   GLUCOSE 84 09/16/2011   CHOL 190 09/16/2011   TRIG 90.0 09/16/2011   HDL 50.40 09/16/2011   LDLCALC 122* 09/16/2011   ALT 21 09/16/2011   AST 19 09/16/2011   NA 139 09/16/2011   K 5.0 09/16/2011   CL 102 09/16/2011   CREATININE 1.0 09/16/2011   BUN 19 09/16/2011   CO2 27 09/16/2011   TSH 0.84 09/16/2011   PSA 0.74 09/16/2011          Assessment & Plan:

## 2012-03-22 NOTE — Assessment & Plan Note (Signed)
Better Continue with current prescription therapy as reflected on the Med list.  

## 2012-03-22 NOTE — Assessment & Plan Note (Signed)
Continue with current prescription therapy as reflected on the Med list.  

## 2012-03-22 NOTE — Assessment & Plan Note (Addendum)
Chronic  SBP at home 130's. Will add losartan

## 2012-03-22 NOTE — Assessment & Plan Note (Addendum)
Continue with current prescription therapy as reflected on the Med list. Cont Rx

## 2012-03-28 ENCOUNTER — Ambulatory Visit (INDEPENDENT_AMBULATORY_CARE_PROVIDER_SITE_OTHER): Payer: 59

## 2012-03-28 DIAGNOSIS — J309 Allergic rhinitis, unspecified: Secondary | ICD-10-CM

## 2012-04-03 ENCOUNTER — Telehealth: Payer: Self-pay | Admitting: *Deleted

## 2012-04-03 DIAGNOSIS — Z0389 Encounter for observation for other suspected diseases and conditions ruled out: Secondary | ICD-10-CM

## 2012-04-03 DIAGNOSIS — Z0000 Encounter for general adult medical examination without abnormal findings: Secondary | ICD-10-CM

## 2012-04-03 NOTE — Telephone Encounter (Signed)
Message copied by Merrilyn Puma on Mon Apr 03, 2012  8:51 AM ------      Message from: Etheleen Sia      Created: Wed Mar 22, 2012  9:44 AM      Regarding: LABS       PHYSICAL IN [place]

## 2012-04-03 NOTE — Telephone Encounter (Signed)
CPE labs entered.  

## 2012-04-04 ENCOUNTER — Ambulatory Visit (INDEPENDENT_AMBULATORY_CARE_PROVIDER_SITE_OTHER): Payer: 59

## 2012-04-04 DIAGNOSIS — J309 Allergic rhinitis, unspecified: Secondary | ICD-10-CM

## 2012-04-10 ENCOUNTER — Encounter: Payer: Self-pay | Admitting: Internal Medicine

## 2012-04-10 ENCOUNTER — Ambulatory Visit (INDEPENDENT_AMBULATORY_CARE_PROVIDER_SITE_OTHER): Payer: 59 | Admitting: Internal Medicine

## 2012-04-10 VITALS — BP 112/84 | HR 80 | Temp 98.1°F | Ht 74.0 in | Wt 209.0 lb

## 2012-04-10 DIAGNOSIS — R5383 Other fatigue: Secondary | ICD-10-CM

## 2012-04-10 DIAGNOSIS — R5381 Other malaise: Secondary | ICD-10-CM

## 2012-04-10 DIAGNOSIS — I10 Essential (primary) hypertension: Secondary | ICD-10-CM

## 2012-04-10 MED ORDER — DILTIAZEM HCL ER COATED BEADS 120 MG PO CP24: 120.0000 mg | ORAL_CAPSULE | Freq: Every day | ORAL | Status: DC

## 2012-04-10 NOTE — Progress Notes (Signed)
Subjective:    Patient ID: John Boone, male    DOB: [birth year], 61 y.o.   MRN: [id number]  Hypertension This is a chronic problem. The problem has been waxing and waning since onset. The problem is uncontrolled. Pertinent negatives include no chest pain, neck pain or palpitations.    The patient presents for a follow-up of  chronic hypertension, chronic palpitations, rosacea controlled with medicines. BP has been a little up at home - SBP is never above 160.Marland KitchenMarland KitchenHe took Losartan and had side effects - he stopped it. SBP -140-150. He would like to take Cardizem CD again - he said the rash was not due to it in the past  BP Readings from Last 3 Encounters:  04/10/12 112/84  03/22/12 190/120  09/23/11 148/90   Wt Readings from Last 3 Encounters:  04/10/12 209 lb (94.802 kg)  03/22/12 212 lb (96.163 kg)  09/23/11 219 lb 3.2 oz (99.428 kg)       yReview of Systems  Constitutional: Negative for appetite change, fatigue and unexpected weight change.  HENT: Negative for nosebleeds, congestion, sore throat, sneezing, trouble swallowing and neck pain.   Eyes: Negative for itching and visual disturbance.  Respiratory: Negative for cough.   Cardiovascular: Negative for chest pain, palpitations and leg swelling.  Gastrointestinal: Negative for nausea, diarrhea, blood in stool and abdominal distention.  Genitourinary: Negative for frequency and hematuria.  Musculoskeletal: Negative for back pain, joint swelling and gait problem.  Skin: Positive for rash.  Neurological: Negative for dizziness, tremors, speech difficulty and weakness.  Psychiatric/Behavioral: Negative for disturbed wake/sleep cycle, dysphoric mood and agitation. The patient is not nervous/anxious.        Objective:   Physical Exam  Constitutional: He is oriented to person, place, and time. He appears well-developed.  HENT:  Mouth/Throat: Oropharynx is clear and moist.  Eyes: Conjunctivae are normal. Pupils are equal,  round, and reactive to light.  Neck: Normal range of motion. No JVD present. No thyromegaly present.  Cardiovascular: Normal rate, regular rhythm, normal heart sounds and intact distal pulses.  Exam reveals no gallop and no friction rub.   No murmur heard. Pulmonary/Chest: Effort normal and breath sounds normal. No respiratory distress. He has no wheezes. He has no rales. He exhibits no tenderness.  Abdominal: Soft. Bowel sounds are normal. He exhibits no distension and no mass. There is no tenderness. There is no rebound and no guarding.  Genitourinary: Prostate normal. Guaiac negative stool. No penile tenderness.  Musculoskeletal: Normal range of motion. He exhibits no edema and no tenderness.  Lymphadenopathy:    He has no cervical adenopathy.  Neurological: He is alert and oriented to person, place, and time. He has normal reflexes. No cranial nerve deficit. He exhibits normal muscle tone. Coordination normal.  Skin: Skin is warm and dry. Rash (rozacea) noted.  Psychiatric: He has a normal mood and affect. His behavior is normal. Judgment and thought content normal.   Lab Results  Component Value Date   WBC 7.4 09/16/2011   HGB 16.1 09/16/2011   HCT 46.9 09/16/2011   PLT 228.0 09/16/2011   GLUCOSE 84 09/16/2011   CHOL 190 09/16/2011   TRIG 90.0 09/16/2011   HDL 50.40 09/16/2011   LDLCALC 122* 09/16/2011   ALT 21 09/16/2011   AST 19 09/16/2011   NA 139 09/16/2011   K 5.0 09/16/2011   CL 102 09/16/2011   CREATININE 1.0 09/16/2011   BUN 19 09/16/2011   CO2 27 09/16/2011   TSH  0.84 09/16/2011   PSA 0.74 09/16/2011          Assessment & Plan:

## 2012-04-10 NOTE — Assessment & Plan Note (Signed)
D/c'd Losartan Start Cardizem CD - he tol it well in the past - rash was due to smthg else

## 2012-04-11 ENCOUNTER — Ambulatory Visit (INDEPENDENT_AMBULATORY_CARE_PROVIDER_SITE_OTHER): Payer: 59

## 2012-04-11 DIAGNOSIS — J309 Allergic rhinitis, unspecified: Secondary | ICD-10-CM

## 2012-04-18 ENCOUNTER — Ambulatory Visit (INDEPENDENT_AMBULATORY_CARE_PROVIDER_SITE_OTHER): Payer: 59

## 2012-04-18 DIAGNOSIS — J309 Allergic rhinitis, unspecified: Secondary | ICD-10-CM

## 2012-04-25 ENCOUNTER — Ambulatory Visit (INDEPENDENT_AMBULATORY_CARE_PROVIDER_SITE_OTHER): Payer: 59

## 2012-04-25 DIAGNOSIS — J309 Allergic rhinitis, unspecified: Secondary | ICD-10-CM

## 2012-05-02 ENCOUNTER — Ambulatory Visit (INDEPENDENT_AMBULATORY_CARE_PROVIDER_SITE_OTHER): Payer: 59

## 2012-05-02 DIAGNOSIS — J309 Allergic rhinitis, unspecified: Secondary | ICD-10-CM

## 2012-05-09 ENCOUNTER — Ambulatory Visit (INDEPENDENT_AMBULATORY_CARE_PROVIDER_SITE_OTHER): Payer: 59

## 2012-05-09 DIAGNOSIS — J309 Allergic rhinitis, unspecified: Secondary | ICD-10-CM

## 2012-05-16 ENCOUNTER — Ambulatory Visit (INDEPENDENT_AMBULATORY_CARE_PROVIDER_SITE_OTHER): Payer: 59

## 2012-05-16 DIAGNOSIS — J309 Allergic rhinitis, unspecified: Secondary | ICD-10-CM

## 2012-05-18 ENCOUNTER — Encounter: Payer: Self-pay | Admitting: Gastroenterology

## 2012-05-23 ENCOUNTER — Ambulatory Visit (INDEPENDENT_AMBULATORY_CARE_PROVIDER_SITE_OTHER): Payer: 59

## 2012-05-23 DIAGNOSIS — J309 Allergic rhinitis, unspecified: Secondary | ICD-10-CM

## 2012-05-31 ENCOUNTER — Ambulatory Visit (INDEPENDENT_AMBULATORY_CARE_PROVIDER_SITE_OTHER): Payer: 59

## 2012-05-31 DIAGNOSIS — J309 Allergic rhinitis, unspecified: Secondary | ICD-10-CM

## 2012-06-06 ENCOUNTER — Ambulatory Visit (INDEPENDENT_AMBULATORY_CARE_PROVIDER_SITE_OTHER): Payer: 59

## 2012-06-06 DIAGNOSIS — J309 Allergic rhinitis, unspecified: Secondary | ICD-10-CM

## 2012-06-13 ENCOUNTER — Encounter: Payer: Self-pay | Admitting: Internal Medicine

## 2012-06-13 ENCOUNTER — Ambulatory Visit (INDEPENDENT_AMBULATORY_CARE_PROVIDER_SITE_OTHER): Payer: 59

## 2012-06-13 DIAGNOSIS — J309 Allergic rhinitis, unspecified: Secondary | ICD-10-CM

## 2012-06-14 ENCOUNTER — Ambulatory Visit (INDEPENDENT_AMBULATORY_CARE_PROVIDER_SITE_OTHER): Payer: 59 | Admitting: Internal Medicine

## 2012-06-14 ENCOUNTER — Encounter: Payer: Self-pay | Admitting: Internal Medicine

## 2012-06-14 ENCOUNTER — Ambulatory Visit (INDEPENDENT_AMBULATORY_CARE_PROVIDER_SITE_OTHER)
Admission: RE | Admit: 2012-06-14 | Discharge: 2012-06-14 | Disposition: A | Discharge status: Home / Self Care | Payer: 59 | Source: Ambulatory Visit | Attending: Internal Medicine | Admitting: Internal Medicine

## 2012-06-14 VITALS — BP 130/80 | HR 79 | Ht 74.0 in | Wt 217.0 lb

## 2012-06-14 DIAGNOSIS — J45909 Unspecified asthma, uncomplicated: Secondary | ICD-10-CM

## 2012-06-14 DIAGNOSIS — J301 Allergic rhinitis due to pollen: Secondary | ICD-10-CM

## 2012-06-14 DIAGNOSIS — J209 Acute bronchitis, unspecified: Secondary | ICD-10-CM

## 2012-06-14 MED ORDER — LEVALBUTEROL HCL 0.63 MG/3ML IN NEBU
0.6300 mg | INHALATION_SOLUTION | Freq: Once | RESPIRATORY_TRACT | Status: AC
  Administered 2012-06-14: 0.63 mg via RESPIRATORY_TRACT

## 2012-06-14 MED ORDER — PREDNISONE 10 MG PO TABS: 20.0000 mg | ORAL_TABLET | Freq: Every day | ORAL | Status: AC

## 2012-06-14 MED ORDER — LORATADINE 10 MG PO TABS: 10.0000 mg | ORAL_TABLET | Freq: Every day | ORAL | Status: DC

## 2012-06-14 NOTE — Progress Notes (Signed)
09/14/11- 60 yoM former smoker, followed for asthma, allergic rhinitis, complicated by AFib/PAT,  LOV-09/15/10 At work he is exposed to secondhand smoke which makes him sneeze. He has done well with maintenance allergy vaccine without need to change. In the last few weeks has resumed loratadine and Sudafed, questioning mold from wet ground. No asthma and a very rare need for rescue inhaler.  06/14/12- 60 yoM former smoker, followed for asthma, allergic rhinitis, complicated by AFib/PAT, Still on allergy vaccine 1:10 GH and doing well; states still has some stuffiness at times but better since last visit. On a long bike ride one week ago, he inhaled ? an insect. He began coughing immediately and still gets some persistent cough with clear mucus. Rescue inhaler has been some help.  ROS-see HPI Constitutional:   No-   weight loss, night sweats, fevers, chills, fatigue, lassitude. HEENT:   No-  headaches, difficulty swallowing, tooth/dental problems, sore throat,       No-  sneezing, itching, ear ache,   + mildnasal congestion, post nasal drip,  CV:  No-   chest pain, orthopnea, PND, swelling in lower extremities, anasarca,  dizziness, palpitations Resp: No-   shortness of breath with exertion or at rest.              No-   productive cough,  No non-productive cough,  No- coughing up of blood.              No-   change in color of mucus.  No- wheezing.   Skin: No-   rash or lesions. GI:  No-   heartburn, indigestion, abdominal pain, nausea, GU: MS:  No-   joint pain or swelling.   Neuro-     nothing unusual Psych:  No- change in mood or affect. No depression or anxiety.  No memory loss.  OBJ General- Alert, Oriented, Affect-appropriate, Distress- none acute Skin- rash-none, lesions- none, excoriation- none. Chronic rhinophyma appearance Lymphadenopathy- none Head- atraumatic            Eyes- Gross vision intact, PERRLA, conjunctivae clear secretions            Ears- Hearing, canals-normal         Nose- turbinate edema, no-Septal dev, mucus, polyps, erosion, perforation             Throat- Mallampati II , mucosa clear , drainage- none, tonsils- atrophic Neck- flexible , trachea midline, no stridor , thyroid nl, carotid no bruit Chest - symmetrical excursion , unlabored           Heart/CV- RRR- rate controlled , no murmur , no gallop  , no rub, nl s1 s2                           - JVD- none , edema- none, stasis changes- none, varices- none           Lung- clear to P&A, wheeze- none, cough- none , dullness-none, rub- none           Chest wall-  Abd- Extrem- cyanosis- none, clubbing, none, atrophy- none, strength- nl Neuro- grossly intact to observation

## 2012-06-14 NOTE — Patient Instructions (Addendum)
Script sent refilling loratadine  Script sent for prednisone to take 1 daily x 4 days  Order CXR- dx acute bronchitis  Neb xop

## 2012-06-15 ENCOUNTER — Other Ambulatory Visit: Payer: Self-pay | Admitting: Internal Medicine

## 2012-06-15 DIAGNOSIS — R918 Other nonspecific abnormal finding of lung field: Secondary | ICD-10-CM

## 2012-06-15 NOTE — Progress Notes (Signed)
Quick Note:  Pt aware of results and requests to have CT Chest with contrast on Monday 06-19-12 and will come by our office early Monday to have STAT labs done. Orders placed and patient aware he will get a call from our PCC's about date and time. ______

## 2012-06-19 ENCOUNTER — Ambulatory Visit (INDEPENDENT_AMBULATORY_CARE_PROVIDER_SITE_OTHER)
Admission: RE | Admit: 2012-06-19 | Discharge: 2012-06-19 | Disposition: A | Discharge status: Home / Self Care | Payer: 59 | Source: Ambulatory Visit | Attending: Internal Medicine | Admitting: Internal Medicine

## 2012-06-19 DIAGNOSIS — R918 Other nonspecific abnormal finding of lung field: Secondary | ICD-10-CM

## 2012-06-19 MED ORDER — IOHEXOL 300 MG/ML  SOLN
80.0000 mL | Freq: Once | INTRAMUSCULAR | Status: AC | PRN
  Administered 2012-06-19: 80 mL via INTRAVENOUS

## 2012-06-20 ENCOUNTER — Encounter: Payer: Self-pay | Admitting: Internal Medicine

## 2012-06-20 ENCOUNTER — Ambulatory Visit (INDEPENDENT_AMBULATORY_CARE_PROVIDER_SITE_OTHER): Payer: 59 | Admitting: Internal Medicine

## 2012-06-20 VITALS — BP 132/78 | HR 75 | Temp 97.7°F | Ht 74.0 in | Wt 217.5 lb

## 2012-06-20 DIAGNOSIS — J45909 Unspecified asthma, uncomplicated: Secondary | ICD-10-CM

## 2012-06-20 DIAGNOSIS — I10 Essential (primary) hypertension: Secondary | ICD-10-CM

## 2012-06-20 DIAGNOSIS — R002 Palpitations: Secondary | ICD-10-CM

## 2012-06-20 DIAGNOSIS — I2510 Atherosclerotic heart disease of native coronary artery without angina pectoris: Secondary | ICD-10-CM | POA: Insufficient documentation

## 2012-06-20 NOTE — Progress Notes (Signed)
Subjective:    Patient ID: John Boone, male    DOB: 30-Mar-1951, 61 y.o.   MRN: [id number]  Hypertension This is a chronic problem. The problem has been waxing and waning since onset. The problem is uncontrolled. Pertinent negatives include no chest pain, neck pain or palpitations.   F/u chest CT after he inhaled a bug - saw Dr Maple Hudson The patient presents for a follow-up of  chronic hypertension, chronic palpitations, rosacea controlled with medicines. BP has been a little up at home - SBP is never above 160... SBP -140-150. He would like to take Cardizem CD again - he said the rash was not due to it in the past  BP Readings from Last 3 Encounters:  06/20/12 132/78  06/14/12 130/80  04/10/12 112/84   Wt Readings from Last 3 Encounters:  06/20/12 217 lb 8 oz (98.657 kg)  06/14/12 217 lb (98.431 kg)  04/10/12 209 lb (94.802 kg)       yReview of Systems  Constitutional: Negative for appetite change, fatigue and unexpected weight change.  HENT: Negative for nosebleeds, congestion, sore throat, sneezing, trouble swallowing and neck pain.   Eyes: Negative for itching and visual disturbance.  Respiratory: Negative for cough.   Cardiovascular: Negative for chest pain, palpitations and leg swelling.  Gastrointestinal: Negative for nausea, diarrhea, blood in stool and abdominal distention.  Genitourinary: Negative for frequency and hematuria.  Musculoskeletal: Negative for back pain, joint swelling and gait problem.  Skin: Positive for rash.  Neurological: Negative for dizziness, tremors, speech difficulty and weakness.  Psychiatric/Behavioral: Negative for disturbed wake/sleep cycle, dysphoric mood and agitation. The patient is not nervous/anxious.        Objective:   Physical Exam  Constitutional: He is oriented to person, place, and time. He appears well-developed.  HENT:  Mouth/Throat: Oropharynx is clear and moist.  Eyes: Conjunctivae normal are normal. Pupils are equal,  round, and reactive to light.  Neck: Normal range of motion. No JVD present. No thyromegaly present.  Cardiovascular: Normal rate, regular rhythm, normal heart sounds and intact distal pulses.  Exam reveals no gallop and no friction rub.   No murmur heard. Pulmonary/Chest: Effort normal and breath sounds normal. No respiratory distress. He has no wheezes. He has no rales. He exhibits no tenderness.  Abdominal: Soft. Bowel sounds are normal. He exhibits no distension and no mass. There is no tenderness. There is no rebound and no guarding.  Genitourinary: Prostate normal. Guaiac negative stool. No penile tenderness.  Musculoskeletal: Normal range of motion. He exhibits no edema and no tenderness.  Lymphadenopathy:    He has no cervical adenopathy.  Neurological: He is alert and oriented to person, place, and time. He has normal reflexes. No cranial nerve deficit. He exhibits normal muscle tone. Coordination normal.  Skin: Skin is warm and dry. Rash (rozacea) noted.  Psychiatric: He has a normal mood and affect. His behavior is normal. Judgment and thought content normal.   Lab Results  Component Value Date   WBC 7.4 09/16/2011   HGB 16.1 09/16/2011   HCT 46.9 09/16/2011   PLT 228.0 09/16/2011   GLUCOSE 84 09/16/2011   CHOL 190 09/16/2011   TRIG 90.0 09/16/2011   HDL 50.40 09/16/2011   LDLCALC 122* 09/16/2011   ALT 21 09/16/2011   AST 19 09/16/2011   NA 139 09/16/2011   K 5.0 09/16/2011   CL 102 09/16/2011   CREATININE 1.0 09/16/2011   BUN 19 09/16/2011   CO2 27 09/16/2011  TSH 0.84 09/16/2011   PSA 0.74 09/16/2011          Assessment & Plan:

## 2012-06-20 NOTE — Assessment & Plan Note (Addendum)
IMPRESSION:  1. The plain film abnormalities were likely secondary to partially  calcified pleural plaques bilaterally; indicative of asbestos  related pleural disease. No pulmonary nodules.  2. Mild centrilobular emphysema, without acute superimposed  process in the chest.  3. Esophageal air fluid level suggests dysmotility or  gastroesophageal reflux.  4. Multivessel coronary artery atherosclerosis.  Original Report Authenticated By: Consuello Bossier, M.D.  Card cons Dr Liliane Bade

## 2012-06-20 NOTE — Assessment & Plan Note (Signed)
Better  

## 2012-06-20 NOTE — Assessment & Plan Note (Signed)
Better controlled Continue with current prescription therapy as reflected on the Med list.  

## 2012-06-21 ENCOUNTER — Ambulatory Visit (INDEPENDENT_AMBULATORY_CARE_PROVIDER_SITE_OTHER): Payer: 59

## 2012-06-21 ENCOUNTER — Telehealth: Payer: Self-pay | Admitting: Internal Medicine

## 2012-06-21 DIAGNOSIS — J309 Allergic rhinitis, unspecified: Secondary | ICD-10-CM

## 2012-06-21 NOTE — Telephone Encounter (Signed)
He had already reviewed result with Dr Posey Rea.  He had worked as a Psychologist, occupational remotely, so any asbestos exposure was incidental to that.

## 2012-06-23 NOTE — Assessment & Plan Note (Signed)
Rhinitis control has been generally good as he continues allergy vaccine with no problem.

## 2012-06-23 NOTE — Assessment & Plan Note (Signed)
I reviewed the imaging with him. He does not have known history of asbestos exposure but did work as a Psychologist, occupational. Plan-prednisone 20 mg daily x4 days, nebulized Xopenex, loratadine, chest x-ray

## 2012-06-27 ENCOUNTER — Ambulatory Visit (INDEPENDENT_AMBULATORY_CARE_PROVIDER_SITE_OTHER): Payer: 59

## 2012-06-27 DIAGNOSIS — J309 Allergic rhinitis, unspecified: Secondary | ICD-10-CM

## 2012-07-04 ENCOUNTER — Ambulatory Visit (INDEPENDENT_AMBULATORY_CARE_PROVIDER_SITE_OTHER): Payer: 59

## 2012-07-04 DIAGNOSIS — J309 Allergic rhinitis, unspecified: Secondary | ICD-10-CM

## 2012-07-11 ENCOUNTER — Ambulatory Visit (INDEPENDENT_AMBULATORY_CARE_PROVIDER_SITE_OTHER): Payer: 59

## 2012-07-11 DIAGNOSIS — J309 Allergic rhinitis, unspecified: Secondary | ICD-10-CM

## 2012-07-12 ENCOUNTER — Ambulatory Visit (INDEPENDENT_AMBULATORY_CARE_PROVIDER_SITE_OTHER): Payer: 59

## 2012-07-12 DIAGNOSIS — J309 Allergic rhinitis, unspecified: Secondary | ICD-10-CM

## 2012-07-18 ENCOUNTER — Ambulatory Visit (INDEPENDENT_AMBULATORY_CARE_PROVIDER_SITE_OTHER): Payer: 59 | Admitting: Cardiology

## 2012-07-18 ENCOUNTER — Encounter: Payer: Self-pay | Admitting: Cardiology

## 2012-07-18 VITALS — BP 142/100 | HR 64 | Ht 74.0 in | Wt 218.0 lb

## 2012-07-18 DIAGNOSIS — I10 Essential (primary) hypertension: Secondary | ICD-10-CM

## 2012-07-18 DIAGNOSIS — I471 Supraventricular tachycardia: Secondary | ICD-10-CM

## 2012-07-18 DIAGNOSIS — I498 Other specified cardiac arrhythmias: Secondary | ICD-10-CM

## 2012-07-18 DIAGNOSIS — R0789 Other chest pain: Secondary | ICD-10-CM

## 2012-07-18 DIAGNOSIS — R079 Chest pain, unspecified: Secondary | ICD-10-CM

## 2012-07-18 MED ORDER — PRAVASTATIN SODIUM 40 MG PO TABS: 40.0000 mg | ORAL_TABLET | Freq: Every evening | ORAL | Status: DC

## 2012-07-18 NOTE — Progress Notes (Signed)
HPI: 61 year old male for evaluation of coronary artery disease. I did see the patient in 2009 for palpitations and dizziness. The patient had a stress test in 1996 that was negative. He also had a monitor placed and was noted to have SVT at a rate of 227. 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 Ablation was discussed but the patient preferred medical therapy. Echocardiogram in 2009 showed normal LV function. Patient had a recent chest CT in October of 2013 that showed coronary atherosclerosis. Cardiology asked to evaluate. He has dyspnea with more extreme activities but not routine activities. No orthopnea, PND, pedal edema, syncope or claudication. He occasionally has chest heaviness but has had that for years. It is not related to exertion and resolves spontaneously. No associated symptoms. He does not have exertional chest pain.  Current Outpatient Prescriptions  Medication Sig Dispense Refill  . albuterol (PROVENTIL HFA) 108 (90 BASE) MCG/ACT inhaler Inhale 2 puffs into the lungs 4 (four) times daily as needed for shortness of breath.  1 Inhaler  prn  . aspirin 325 MG tablet Take 325 mg by mouth daily.        . Cholecalciferol 1000 UNITS tablet Take 1,000 Units by mouth daily.        Marland Kitchen diltiazem (CARDIZEM CD) 120 MG 24 hr capsule Take 1 capsule (120 mg total) by mouth daily.  30 capsule  11  . doxycycline (VIBRA-TABS) 100 MG tablet Take 1 tablet (100 mg total) by mouth 2 (two) times daily.  60 tablet  11  . ibuprofen (ADVIL,MOTRIN) 600 MG tablet Take twice a day x 2 weeks, then prn pain       . loratadine (CLARITIN) 10 MG tablet Take 1 tablet (10 mg total) by mouth daily. For allergies.  30 tablet  prn  . metoprolol succinate (TOPROL-XL) 50 MG 24 hr tablet Take 25 mg by mouth daily. Take with or immediately following a meal.       . metroNIDAZOLE (METROGEL) 1 % gel Apply topically daily.  60 g  3  . NON FORMULARY Inject into the skin. Allergy Shots 1:10 GH          Allergies  Allergen Reactions  . Losartan     Felt bad, sweaty  . Meloxicam     fatigue  . Verapamil     Not an allergic reaction    Past Medical History  Diagnosis Date  . Allergic rhinitis   . Asthma   . Rosacea   . Nummular eczema     [facility]/Dermatology  . A-fib     Question history of one episode  . SVT (supraventricular tachycardia)   . HTN (hypertension)     Past Surgical History  Procedure Date  . Lipoma excision   . Tonsillectomy     History   Social History  . Marital Status: Married    Spouse Name: N/A    Number of Children: 2  . Years of Education: N/A   Occupational History  . Help Desk Analyst [employer]   Social History Main Topics  . Smoking status: Former Games developer  . Smokeless tobacco: Not on file     Comment: quit 1996  . Alcohol Use: 8.4 oz/week    14 Cans of beer per week     Comment: 3-4 beers/day  . Drug Use: No  . Sexually Active: Yes   Other Topics Concern  . Not on file   Social History Narrative   Regular  Exercise-YesWorks at [employer] as Help Desk Analyst.Married.    Family History  Problem Relation Age of Onset  . Lymphoma Mother     deceased  . Heart attack Mother     deceased  . Cancer Mother 17    lymphoma    ROS: no fevers or chills, productive cough, hemoptysis, dysphasia, odynophagia, melena, hematochezia, dysuria, hematuria, rash, seizure activity, orthopnea, PND, pedal edema, claudication. Remaining systems are negative.  Physical Exam:   Blood pressure 142/100, pulse 64, height 6\' 2"  (1.88 m), weight 218 lb (98.884 kg).  General:  Well developed/well nourished in NAD Skin warm/dry Patient not depressed No peripheral clubbing Back-normal HEENT-normal/normal eyelids Neck supple/normal carotid upstroke bilaterally; no bruits; no JVD; no thyromegaly chest - CTA/ normal expansion CV - RRR/normal S1 and S2; no murmurs, rubs or gallops;  PMI nondisplaced Abdomen -NT/ND, no HSM, no mass, +  bowel sounds, no bruit 2+ femoral pulses, no bruits Ext-no edema, chords, 2+ DP Neuro-grossly nonfocal  ECG sinus rhythm at a rate of 64. Right bundle branch block.

## 2012-07-18 NOTE — Assessment & Plan Note (Signed)
Patient noted to have atherosclerosis on CT scan. Plan stress Myoview to exclude ischemia. Continue aspirin. Add Pravachol 40 mg daily and check lipids and liver in 6 weeks.

## 2012-07-18 NOTE — Assessment & Plan Note (Signed)
Symptoms atypical. Schedule stress Myoview for risk stratification. 

## 2012-07-18 NOTE — Assessment & Plan Note (Signed)
Blood pressure elevated but patient states typically controlled. He will monitor and we will add additional medications as needed.

## 2012-07-18 NOTE — Assessment & Plan Note (Signed)
Patient with documented SVT in the past. Continue calcium and beta blocker. His episodes are infrequent. We would consider ablation in the future if they worsen

## 2012-07-18 NOTE — Patient Instructions (Addendum)
Your physician wants you to follow-up in: ONE YEAR WITH DR Shelda Pal will receive a reminder letter in the mail two months in advance. If you don't receive a letter, please call our office to schedule the follow-up appointment.   Your physician has requested that you have en exercise stress myoview. For further information please visit [email or web address] Please follow instruction sheet, as given.   START PRAVASTATIN 40 MG ONCE DAILY AT BEDTIME  Your physician recommends that you return for lab work in: 6 WEEKS= FASTING

## 2012-07-20 ENCOUNTER — Ambulatory Visit (INDEPENDENT_AMBULATORY_CARE_PROVIDER_SITE_OTHER): Payer: 59

## 2012-07-20 DIAGNOSIS — J309 Allergic rhinitis, unspecified: Secondary | ICD-10-CM

## 2012-07-25 ENCOUNTER — Ambulatory Visit (HOSPITAL_COMMUNITY): Payer: 59 | Attending: Cardiology | Admitting: Radiology

## 2012-07-25 VITALS — BP 151/90 | HR 67 | Ht 74.0 in | Wt 216.0 lb

## 2012-07-25 DIAGNOSIS — R0602 Shortness of breath: Secondary | ICD-10-CM

## 2012-07-25 DIAGNOSIS — R079 Chest pain, unspecified: Secondary | ICD-10-CM | POA: Insufficient documentation

## 2012-07-25 DIAGNOSIS — R9439 Abnormal result of other cardiovascular function study: Secondary | ICD-10-CM

## 2012-07-25 DIAGNOSIS — R0789 Other chest pain: Secondary | ICD-10-CM

## 2012-07-25 MED ORDER — TECHNETIUM TC 99M SESTAMIBI GENERIC - CARDIOLITE
30.0000 | Freq: Once | INTRAVENOUS | Status: AC | PRN
  Administered 2012-07-25: 30 via INTRAVENOUS

## 2012-07-25 MED ORDER — TECHNETIUM TC 99M SESTAMIBI GENERIC - CARDIOLITE
10.0000 | Freq: Once | INTRAVENOUS | Status: AC | PRN
  Administered 2012-07-25: 10 via INTRAVENOUS

## 2012-07-25 NOTE — Progress Notes (Signed)
[facility] SITE 3 NUCLEAR MED [id number] [place] [address] [phone]  Cardiology Nuclear Med Study  John Boone is a 61 y.o. male     MRN : [id number]     DOB: Dec 30, 1950  Procedure Date: 07/25/2012  Nuclear Med Background Indication for Stress Test:  Evaluation for Ischemia History:  '86 MPS:"some ischemia" per patient; '96 GEX:BMWUXL; '09 Echo:EF=60%;:10/13 Cardiac KG:MWNUU vessel CAD Cardiac Risk Factors: Family History - CAD, History of Smoking, Hypertension and RBBB  Symptoms:  Chest Tightness (last episode of chest discomfort was last week), DOE, Palpitations and Rapid HR   Nuclear Pre-Procedure Caffeine/Decaff Intake:  None > 12 hrs NPO After: 5:30am   Lungs:  Clear. O2 Sat: 97% on room air. IV 0.9% NS with Angio Cath:  22g  IV Site: R Antecubital x 1, tolerated well IV Started by:  Irean Hong, RN  Chest Size (in):  44 Cup Size: n/a  Height: 6\' 2"  (1.88 m)  Weight:  216 lb (97.977 kg)  BMI:  Body mass index is 27.73 kg/(m^2). Tech Comments:  Held Toprol x 24 hrs    Nuclear Med Study 1 or 2 day study: 1 day  Stress Test Type:  Stress  Reading MD: Marca Ancona, MD  Order Authorizing Provider:  Olga Millers, MD  Resting Radionuclide: Technetium 58m Sestamibi  Resting Radionuclide Dose: 9.5 mCi   Stress Radionuclide:  Technetium 32m Sestamibi  Stress Radionuclide Dose: 30.9 mCi           Stress Protocol Rest HR: 67 Stress HR: 144  Rest BP: 151/90 Stress BP: 218/101  Exercise Time (min): 9:30 METS: 10.4   Predicted Max HR: 160 bpm % Max HR: 90 bpm Rate Pressure Product: 72536   Dose of Adenosine (mg):  n/a Dose of Lexiscan: n/a mg  Dose of Atropine (mg): n/a Dose of Dobutamine: n/a mcg/kg/min (at max HR)  Stress Test Technologist: Smiley Houseman, CMA-N  Nuclear Technologist:  Domenic Polite, CNMT     Rest Procedure:  Myocardial perfusion imaging was performed at rest 45 minutes following the intravenous  administration of Technetium 17m Sestamibi.  Rest ECG: RBBB.  Stress Procedure:  The patient exercised on the treadmill utilizing the Bruce protocol for 9:30 minutes. He then stopped due to fatigue and denied any chest pain.  There were no diagnostic ST-T wave changes, there was ST segment depression late recovery in V2.  Technetium 85m negative Sestamibi was injected at peak exercise and myocardial perfusion imaging was performed after a brief delay.  Stress ECG: 2 mm ST depression V2/V3 but had RBBB.   QPS Raw Data Images:  Normal; no motion artifact; normal heart/lung ratio. Stress Images:  Small, mild basal inferior perfusion defect. Rest Images:  Small, mild basal inferior perfusion defect.  Subtraction (SDS):  Fixed, small, mild basal inferior perfusion defect. Transient Ischemic Dilatation (Normal <1.22):  0.85 Lung/Heart Ratio (Normal <0.45):  0.32  Quantitative Gated Spect Images QGS EDV:  121 ml QGS ESV:  46 ml  Impression Exercise Capacity:  Good exercise capacity. BP Response:  Hypertensive blood pressure response. Clinical Symptoms:  Short of breath, fatigue.  ECG Impression:  2 mm ST depression V2 and V3 with exertion but patient had RBBB.  Comparison with Prior Nuclear Study: No images to compare  Overall Impression:  Low risk study. Fixed small, mild basal inferior perfusion defect likely represents soft tissue attenuation.   LV Ejection Fraction: 62%.  LV Wall Motion:  NL LV Function;  NL Wall Motion  Marca Ancona 07/25/2012

## 2012-07-26 ENCOUNTER — Ambulatory Visit (INDEPENDENT_AMBULATORY_CARE_PROVIDER_SITE_OTHER): Payer: 59

## 2012-07-26 DIAGNOSIS — J309 Allergic rhinitis, unspecified: Secondary | ICD-10-CM

## 2012-08-01 ENCOUNTER — Ambulatory Visit (INDEPENDENT_AMBULATORY_CARE_PROVIDER_SITE_OTHER): Payer: 59

## 2012-08-01 DIAGNOSIS — J309 Allergic rhinitis, unspecified: Secondary | ICD-10-CM

## 2012-08-08 ENCOUNTER — Ambulatory Visit (INDEPENDENT_AMBULATORY_CARE_PROVIDER_SITE_OTHER): Payer: 59

## 2012-08-08 DIAGNOSIS — J309 Allergic rhinitis, unspecified: Secondary | ICD-10-CM

## 2012-08-15 ENCOUNTER — Ambulatory Visit (INDEPENDENT_AMBULATORY_CARE_PROVIDER_SITE_OTHER): Payer: 59

## 2012-08-15 DIAGNOSIS — J309 Allergic rhinitis, unspecified: Secondary | ICD-10-CM

## 2012-08-21 ENCOUNTER — Ambulatory Visit (INDEPENDENT_AMBULATORY_CARE_PROVIDER_SITE_OTHER): Payer: 59

## 2012-08-21 DIAGNOSIS — J309 Allergic rhinitis, unspecified: Secondary | ICD-10-CM

## 2012-08-28 ENCOUNTER — Other Ambulatory Visit: Payer: 59

## 2012-08-31 ENCOUNTER — Ambulatory Visit (INDEPENDENT_AMBULATORY_CARE_PROVIDER_SITE_OTHER): Payer: 59

## 2012-08-31 DIAGNOSIS — J309 Allergic rhinitis, unspecified: Secondary | ICD-10-CM

## 2012-09-05 ENCOUNTER — Ambulatory Visit (INDEPENDENT_AMBULATORY_CARE_PROVIDER_SITE_OTHER): Payer: 59

## 2012-09-05 DIAGNOSIS — J309 Allergic rhinitis, unspecified: Secondary | ICD-10-CM

## 2012-09-12 ENCOUNTER — Ambulatory Visit (INDEPENDENT_AMBULATORY_CARE_PROVIDER_SITE_OTHER): Payer: 59

## 2012-09-12 DIAGNOSIS — J309 Allergic rhinitis, unspecified: Secondary | ICD-10-CM

## 2012-09-14 ENCOUNTER — Other Ambulatory Visit (INDEPENDENT_AMBULATORY_CARE_PROVIDER_SITE_OTHER): Payer: 59

## 2012-09-14 ENCOUNTER — Ambulatory Visit: Payer: 59 | Admitting: Internal Medicine

## 2012-09-14 DIAGNOSIS — Z0000 Encounter for general adult medical examination without abnormal findings: Secondary | ICD-10-CM

## 2012-09-14 DIAGNOSIS — Z0389 Encounter for observation for other suspected diseases and conditions ruled out: Secondary | ICD-10-CM

## 2012-09-14 LAB — PSA: PSA: 0.65 ng/mL (ref 0.10–4.00)

## 2012-09-14 LAB — URINALYSIS, ROUTINE W REFLEX MICROSCOPIC
Bilirubin Urine: NEGATIVE
Ketones, ur: NEGATIVE
Leukocytes, UA: NEGATIVE
Specific Gravity, Urine: 1.025 (ref 1.000–1.030)
Total Protein, Urine: NEGATIVE
Urine Glucose: NEGATIVE
pH: 6 (ref 5.0–8.0)

## 2012-09-14 LAB — TSH: TSH: 0.65 u[IU]/mL (ref 0.35–5.50)

## 2012-09-14 LAB — HEPATIC FUNCTION PANEL
AST: 21 U/L (ref 0–37)
Albumin: 4.2 g/dL (ref 3.5–5.2)
Alkaline Phosphatase: 58 U/L (ref 39–117)
Bilirubin, Direct: 0.1 mg/dL (ref 0.0–0.3)
Total Bilirubin: 0.6 mg/dL (ref 0.3–1.2)

## 2012-09-14 LAB — BASIC METABOLIC PANEL
Calcium: 9.5 mg/dL (ref 8.4–10.5)
GFR: 91.37 mL/min (ref >60.00)
Potassium: 4.8 mEq/L (ref 3.5–5.1)
Sodium: 139 mEq/L (ref 135–145)

## 2012-09-19 ENCOUNTER — Ambulatory Visit (INDEPENDENT_AMBULATORY_CARE_PROVIDER_SITE_OTHER): Payer: 59

## 2012-09-19 DIAGNOSIS — J309 Allergic rhinitis, unspecified: Secondary | ICD-10-CM

## 2012-09-22 ENCOUNTER — Encounter: Payer: Self-pay | Admitting: Internal Medicine

## 2012-09-22 ENCOUNTER — Ambulatory Visit (INDEPENDENT_AMBULATORY_CARE_PROVIDER_SITE_OTHER): Payer: 59 | Admitting: Internal Medicine

## 2012-09-22 VITALS — BP 120/82 | HR 72 | Temp 97.0°F | Ht 74.0 in | Wt 217.8 lb

## 2012-09-22 DIAGNOSIS — I10 Essential (primary) hypertension: Secondary | ICD-10-CM

## 2012-09-22 DIAGNOSIS — J45909 Unspecified asthma, uncomplicated: Secondary | ICD-10-CM

## 2012-09-22 DIAGNOSIS — I471 Supraventricular tachycardia: Secondary | ICD-10-CM

## 2012-09-22 DIAGNOSIS — Z0000 Encounter for general adult medical examination without abnormal findings: Secondary | ICD-10-CM

## 2012-09-22 DIAGNOSIS — R5381 Other malaise: Secondary | ICD-10-CM

## 2012-09-22 DIAGNOSIS — I2510 Atherosclerotic heart disease of native coronary artery without angina pectoris: Secondary | ICD-10-CM

## 2012-09-22 NOTE — Progress Notes (Signed)
   Subjective:     HPI  The patient is here for a wellness exam. The patient has been doing well overall without major physical or psychological issues going on latelyReview of Systems  Constitutional: Negative for appetite change, fatigue and unexpected weight change.  HENT: Negative for nosebleeds, congestion, sore throat, sneezing, trouble swallowing and neck pain.   Eyes: Negative for itching and visual disturbance.  Respiratory: Negative for cough.   Cardiovascular: Negative for chest pain, palpitations and leg swelling.  Gastrointestinal: Negative for nausea, diarrhea, blood in stool and abdominal distention.  Genitourinary: Negative for frequency and hematuria.  Musculoskeletal: Negative for back pain, joint swelling and gait problem.  Skin: Positive for rash.  Neurological: Negative for dizziness, tremors, speech difficulty and weakness.  Psychiatric/Behavioral: Negative for sleep disturbance, dysphoric mood and agitation. The patient is not nervous/anxious.    Wt Readings from Last 3 Encounters:  09/22/12 217 lb 12.8 oz (98.793 kg)  07/25/12 216 lb (97.977 kg)  07/18/12 218 lb (98.884 kg)   BP Readings from Last 3 Encounters:  09/22/12 120/82  07/25/12 151/90  07/18/12 142/100        Objective:   Physical Exam  Constitutional: He is oriented to person, place, and time. He appears well-developed.  HENT:  Mouth/Throat: Oropharynx is clear and moist.  Eyes: Conjunctivae normal are normal. Pupils are equal, round, and reactive to light.  Neck: Normal range of motion. No JVD present. No thyromegaly present.  Cardiovascular: Normal rate, regular rhythm, normal heart sounds and intact distal pulses.  Exam reveals no gallop and no friction rub.   No murmur heard. Pulmonary/Chest: Effort normal and breath sounds normal. No respiratory distress. He has no wheezes. He has no rales. He exhibits no tenderness.  Abdominal: Soft. Bowel sounds are normal. He exhibits no distension  and no mass. There is no tenderness. There is no rebound and no guarding.  Genitourinary: Prostate normal. Guaiac negative stool. No penile tenderness.  Musculoskeletal: Normal range of motion. He exhibits no edema and no tenderness.  Lymphadenopathy:    He has no cervical adenopathy.  Neurological: He is alert and oriented to person, place, and time. He has normal reflexes. No cranial nerve deficit. He exhibits normal muscle tone. Coordination normal.  Skin: Skin is warm and dry. Rash (rozacea) noted.  Psychiatric: He has a normal mood and affect. His behavior is normal. Judgment and thought content normal.   Lab Results  Component Value Date   WBC 7.4 09/16/2011   HGB 16.1 09/16/2011   HCT 46.9 09/16/2011   PLT 228.0 09/16/2011   GLUCOSE 95 09/14/2012   CHOL 190 09/16/2011   TRIG 90.0 09/16/2011   HDL 50.40 09/16/2011   LDLCALC 122* 09/16/2011   ALT 26 09/14/2012   AST 21 09/14/2012   NA 139 09/14/2012   K 4.8 09/14/2012   CL 104 09/14/2012   CREATININE 0.9 09/14/2012   BUN 20 09/14/2012   CO2 29 09/14/2012   TSH 0.65 09/14/2012   PSA 0.65 09/14/2012          Assessment & Plan:

## 2012-09-22 NOTE — Assessment & Plan Note (Signed)
Continue with current prescription therapy as reflected on the Med list. Take Cardizem at hs - to reduce tiredness

## 2012-09-22 NOTE — Assessment & Plan Note (Signed)
Continue with current prescription therapy as reflected on the Med list.  

## 2012-09-22 NOTE — Assessment & Plan Note (Signed)
We discussed age appropriate health related issues, including available/recomended screening tests and vaccinations. We discussed a need for adhering to healthy diet and exercise. Labs/EKG were reviewed/ordered. All questions were answered. Zostavax suggested   

## 2012-09-22 NOTE — Assessment & Plan Note (Signed)
Labs incl testost and B12

## 2012-09-26 ENCOUNTER — Ambulatory Visit (INDEPENDENT_AMBULATORY_CARE_PROVIDER_SITE_OTHER): Payer: 59

## 2012-09-26 ENCOUNTER — Ambulatory Visit: Payer: 59

## 2012-09-26 DIAGNOSIS — J309 Allergic rhinitis, unspecified: Secondary | ICD-10-CM

## 2012-10-03 ENCOUNTER — Encounter: Payer: Self-pay | Admitting: Internal Medicine

## 2012-10-03 ENCOUNTER — Ambulatory Visit (INDEPENDENT_AMBULATORY_CARE_PROVIDER_SITE_OTHER): Payer: 59

## 2012-10-03 ENCOUNTER — Ambulatory Visit: Payer: 59

## 2012-10-03 DIAGNOSIS — J309 Allergic rhinitis, unspecified: Secondary | ICD-10-CM

## 2012-10-10 ENCOUNTER — Ambulatory Visit (INDEPENDENT_AMBULATORY_CARE_PROVIDER_SITE_OTHER): Payer: 59

## 2012-10-10 ENCOUNTER — Ambulatory Visit: Payer: 59

## 2012-10-10 DIAGNOSIS — J309 Allergic rhinitis, unspecified: Secondary | ICD-10-CM

## 2012-10-17 ENCOUNTER — Ambulatory Visit (INDEPENDENT_AMBULATORY_CARE_PROVIDER_SITE_OTHER): Payer: 59

## 2012-10-17 ENCOUNTER — Ambulatory Visit: Payer: 59

## 2012-10-17 DIAGNOSIS — J309 Allergic rhinitis, unspecified: Secondary | ICD-10-CM

## 2012-10-24 ENCOUNTER — Ambulatory Visit (INDEPENDENT_AMBULATORY_CARE_PROVIDER_SITE_OTHER): Payer: 59

## 2012-10-24 ENCOUNTER — Ambulatory Visit: Payer: 59

## 2012-10-24 ENCOUNTER — Ambulatory Visit: Payer: 59 | Admitting: Internal Medicine

## 2012-10-24 DIAGNOSIS — J309 Allergic rhinitis, unspecified: Secondary | ICD-10-CM

## 2012-10-26 ENCOUNTER — Other Ambulatory Visit: Payer: Self-pay | Admitting: Internal Medicine

## 2012-10-31 ENCOUNTER — Ambulatory Visit (INDEPENDENT_AMBULATORY_CARE_PROVIDER_SITE_OTHER): Payer: 59

## 2012-10-31 ENCOUNTER — Ambulatory Visit: Payer: 59

## 2012-10-31 DIAGNOSIS — J309 Allergic rhinitis, unspecified: Secondary | ICD-10-CM

## 2012-11-07 ENCOUNTER — Ambulatory Visit (INDEPENDENT_AMBULATORY_CARE_PROVIDER_SITE_OTHER): Payer: 59

## 2012-11-07 DIAGNOSIS — J309 Allergic rhinitis, unspecified: Secondary | ICD-10-CM

## 2012-11-09 ENCOUNTER — Ambulatory Visit (INDEPENDENT_AMBULATORY_CARE_PROVIDER_SITE_OTHER): Payer: 59

## 2012-11-09 DIAGNOSIS — J309 Allergic rhinitis, unspecified: Secondary | ICD-10-CM

## 2012-11-14 ENCOUNTER — Ambulatory Visit (INDEPENDENT_AMBULATORY_CARE_PROVIDER_SITE_OTHER): Payer: 59

## 2012-11-14 DIAGNOSIS — J309 Allergic rhinitis, unspecified: Secondary | ICD-10-CM

## 2012-11-21 ENCOUNTER — Ambulatory Visit (INDEPENDENT_AMBULATORY_CARE_PROVIDER_SITE_OTHER): Payer: 59

## 2012-11-21 DIAGNOSIS — J309 Allergic rhinitis, unspecified: Secondary | ICD-10-CM

## 2012-11-28 ENCOUNTER — Ambulatory Visit (INDEPENDENT_AMBULATORY_CARE_PROVIDER_SITE_OTHER): Payer: 59

## 2012-11-28 DIAGNOSIS — J309 Allergic rhinitis, unspecified: Secondary | ICD-10-CM

## 2012-12-05 ENCOUNTER — Ambulatory Visit (INDEPENDENT_AMBULATORY_CARE_PROVIDER_SITE_OTHER): Payer: 59

## 2012-12-05 DIAGNOSIS — J309 Allergic rhinitis, unspecified: Secondary | ICD-10-CM

## 2012-12-12 ENCOUNTER — Ambulatory Visit (INDEPENDENT_AMBULATORY_CARE_PROVIDER_SITE_OTHER): Payer: 59

## 2012-12-12 DIAGNOSIS — J309 Allergic rhinitis, unspecified: Secondary | ICD-10-CM

## 2012-12-19 ENCOUNTER — Ambulatory Visit (INDEPENDENT_AMBULATORY_CARE_PROVIDER_SITE_OTHER): Payer: 59

## 2012-12-19 DIAGNOSIS — J309 Allergic rhinitis, unspecified: Secondary | ICD-10-CM

## 2012-12-26 ENCOUNTER — Ambulatory Visit (INDEPENDENT_AMBULATORY_CARE_PROVIDER_SITE_OTHER): Payer: 59

## 2012-12-26 DIAGNOSIS — J309 Allergic rhinitis, unspecified: Secondary | ICD-10-CM

## 2013-01-02 ENCOUNTER — Ambulatory Visit (INDEPENDENT_AMBULATORY_CARE_PROVIDER_SITE_OTHER): Payer: 59

## 2013-01-02 DIAGNOSIS — J309 Allergic rhinitis, unspecified: Secondary | ICD-10-CM

## 2013-01-09 ENCOUNTER — Ambulatory Visit (INDEPENDENT_AMBULATORY_CARE_PROVIDER_SITE_OTHER): Payer: 59

## 2013-01-09 DIAGNOSIS — J309 Allergic rhinitis, unspecified: Secondary | ICD-10-CM

## 2013-01-15 ENCOUNTER — Encounter: Payer: Self-pay | Admitting: Internal Medicine

## 2013-01-16 ENCOUNTER — Ambulatory Visit (INDEPENDENT_AMBULATORY_CARE_PROVIDER_SITE_OTHER): Payer: 59

## 2013-01-16 DIAGNOSIS — J309 Allergic rhinitis, unspecified: Secondary | ICD-10-CM

## 2013-01-23 ENCOUNTER — Ambulatory Visit (INDEPENDENT_AMBULATORY_CARE_PROVIDER_SITE_OTHER): Payer: 59

## 2013-01-23 DIAGNOSIS — J309 Allergic rhinitis, unspecified: Secondary | ICD-10-CM

## 2013-01-30 ENCOUNTER — Ambulatory Visit (INDEPENDENT_AMBULATORY_CARE_PROVIDER_SITE_OTHER): Payer: 59

## 2013-01-30 DIAGNOSIS — J309 Allergic rhinitis, unspecified: Secondary | ICD-10-CM

## 2013-02-06 ENCOUNTER — Ambulatory Visit (INDEPENDENT_AMBULATORY_CARE_PROVIDER_SITE_OTHER): Payer: 59

## 2013-02-06 DIAGNOSIS — J309 Allergic rhinitis, unspecified: Secondary | ICD-10-CM

## 2013-02-13 ENCOUNTER — Ambulatory Visit (INDEPENDENT_AMBULATORY_CARE_PROVIDER_SITE_OTHER): Payer: 59

## 2013-02-13 DIAGNOSIS — J309 Allergic rhinitis, unspecified: Secondary | ICD-10-CM

## 2013-02-20 ENCOUNTER — Ambulatory Visit (INDEPENDENT_AMBULATORY_CARE_PROVIDER_SITE_OTHER): Payer: 59

## 2013-02-20 DIAGNOSIS — J309 Allergic rhinitis, unspecified: Secondary | ICD-10-CM

## 2013-02-27 ENCOUNTER — Ambulatory Visit (INDEPENDENT_AMBULATORY_CARE_PROVIDER_SITE_OTHER): Payer: 59

## 2013-02-27 DIAGNOSIS — J309 Allergic rhinitis, unspecified: Secondary | ICD-10-CM

## 2013-03-06 ENCOUNTER — Ambulatory Visit (INDEPENDENT_AMBULATORY_CARE_PROVIDER_SITE_OTHER): Payer: 59

## 2013-03-06 DIAGNOSIS — J309 Allergic rhinitis, unspecified: Secondary | ICD-10-CM

## 2013-03-12 ENCOUNTER — Ambulatory Visit (INDEPENDENT_AMBULATORY_CARE_PROVIDER_SITE_OTHER): Payer: 59

## 2013-03-12 DIAGNOSIS — J309 Allergic rhinitis, unspecified: Secondary | ICD-10-CM

## 2013-03-13 ENCOUNTER — Ambulatory Visit (INDEPENDENT_AMBULATORY_CARE_PROVIDER_SITE_OTHER): Payer: 59

## 2013-03-13 DIAGNOSIS — J309 Allergic rhinitis, unspecified: Secondary | ICD-10-CM

## 2013-03-20 ENCOUNTER — Ambulatory Visit (INDEPENDENT_AMBULATORY_CARE_PROVIDER_SITE_OTHER): Payer: 59

## 2013-03-20 DIAGNOSIS — J309 Allergic rhinitis, unspecified: Secondary | ICD-10-CM

## 2013-03-23 ENCOUNTER — Encounter: Payer: Self-pay | Admitting: Internal Medicine

## 2013-03-23 ENCOUNTER — Ambulatory Visit (INDEPENDENT_AMBULATORY_CARE_PROVIDER_SITE_OTHER): Payer: 59 | Admitting: Internal Medicine

## 2013-03-23 VITALS — BP 150/90 | HR 72 | Temp 98.0°F | Resp 16 | Wt 216.0 lb

## 2013-03-23 DIAGNOSIS — I10 Essential (primary) hypertension: Secondary | ICD-10-CM

## 2013-03-23 DIAGNOSIS — Z0000 Encounter for general adult medical examination without abnormal findings: Secondary | ICD-10-CM

## 2013-03-23 DIAGNOSIS — L719 Rosacea, unspecified: Secondary | ICD-10-CM

## 2013-03-23 DIAGNOSIS — I498 Other specified cardiac arrhythmias: Secondary | ICD-10-CM

## 2013-03-23 DIAGNOSIS — I4891 Unspecified atrial fibrillation: Secondary | ICD-10-CM

## 2013-03-23 DIAGNOSIS — I471 Supraventricular tachycardia: Secondary | ICD-10-CM

## 2013-03-23 MED ORDER — METOPROLOL SUCCINATE ER 50 MG PO TB24: 25.0000 mg | ORAL_TABLET | Freq: Every day | ORAL | Status: DC

## 2013-03-23 MED ORDER — ALBUTEROL SULFATE HFA 108 (90 BASE) MCG/ACT IN AERS: 2.0000 | INHALATION_SPRAY | Freq: Four times a day (QID) | RESPIRATORY_TRACT | Status: DC | PRN

## 2013-03-23 MED ORDER — DOXYCYCLINE HYCLATE 100 MG PO TABS: 100.0000 mg | ORAL_TABLET | Freq: Two times a day (BID) | ORAL | Status: DC

## 2013-03-23 NOTE — Patient Instructions (Signed)
Gluten free trial (no wheat products) x4-6 weeks. OK to use Gluten-free bread and pasta. Milk free trial (no milk, ice cream and yogurt) x4 weeks. OK to use almond or soy milk. 

## 2013-03-23 NOTE — Assessment & Plan Note (Signed)
Continue with current prescription therapy as reflected on the Med list.  

## 2013-03-23 NOTE — Progress Notes (Signed)
   Subjective:     Hypertension This is a chronic problem. The problem has been waxing and waning since onset. The problem is uncontrolled. Pertinent negatives include no chest pain, neck pain or palpitations.   F/u hypertension, chronic palpitations, rosacea controlled with medicines. BP has been a little up at home - SBP is never above 160... SBP -140s. He stopped Cardizem CD again - not tired now  BP Readings from Last 3 Encounters:  03/23/13 150/90  09/22/12 120/82  07/25/12 151/90   Wt Readings from Last 3 Encounters:  03/23/13 216 lb (97.977 kg)  09/22/12 217 lb 12.8 oz (98.793 kg)  07/25/12 216 lb (97.977 kg)       yReview of Systems  Constitutional: Negative for appetite change, fatigue and unexpected weight change.  HENT: Negative for nosebleeds, congestion, sore throat, sneezing, trouble swallowing and neck pain.   Eyes: Negative for itching and visual disturbance.  Respiratory: Negative for cough.   Cardiovascular: Negative for chest pain, palpitations and leg swelling.  Gastrointestinal: Negative for nausea, diarrhea, blood in stool and abdominal distention.  Genitourinary: Negative for frequency and hematuria.  Musculoskeletal: Negative for back pain, joint swelling and gait problem.  Skin: Positive for rash.  Neurological: Negative for dizziness, tremors, speech difficulty and weakness.  Psychiatric/Behavioral: Negative for sleep disturbance, dysphoric mood and agitation. The patient is not nervous/anxious.        Objective:   Physical Exam  Constitutional: He is oriented to person, place, and time. He appears well-developed.  HENT:  Mouth/Throat: Oropharynx is clear and moist.  Eyes: Conjunctivae are normal. Pupils are equal, round, and reactive to light.  Neck: Normal range of motion. No JVD present. No thyromegaly present.  Cardiovascular: Normal rate, regular rhythm, normal heart sounds and intact distal pulses.  Exam reveals no gallop and no friction  rub.   No murmur heard. Pulmonary/Chest: Effort normal and breath sounds normal. No respiratory distress. He has no wheezes. He has no rales. He exhibits no tenderness.  Abdominal: Soft. Bowel sounds are normal. He exhibits no distension and no mass. There is no tenderness. There is no rebound and no guarding.  Genitourinary: Prostate normal. Guaiac negative stool. No penile tenderness.  Musculoskeletal: Normal range of motion. He exhibits no edema and no tenderness.  Lymphadenopathy:    He has no cervical adenopathy.  Neurological: He is alert and oriented to person, place, and time. He has normal reflexes. No cranial nerve deficit. He exhibits normal muscle tone. Coordination normal.  Skin: Skin is warm and dry. Rash (rozacea) noted.  Psychiatric: He has a normal mood and affect. His behavior is normal. Judgment and thought content normal.   Lab Results  Component Value Date   WBC 7.4 09/16/2011   HGB 16.1 09/16/2011   HCT 46.9 09/16/2011   PLT 228.0 09/16/2011   GLUCOSE 95 09/14/2012   CHOL 190 09/16/2011   TRIG 90.0 09/16/2011   HDL 50.40 09/16/2011   LDLCALC 122* 09/16/2011   ALT 26 09/14/2012   AST 21 09/14/2012   NA 139 09/14/2012   K 4.8 09/14/2012   CL 104 09/14/2012   CREATININE 0.9 09/14/2012   BUN 20 09/14/2012   CO2 29 09/14/2012   TSH 0.65 09/14/2012   PSA 0.65 09/14/2012          Assessment & Plan:

## 2013-03-23 NOTE — Assessment & Plan Note (Signed)
No relapse 

## 2013-03-27 ENCOUNTER — Ambulatory Visit (INDEPENDENT_AMBULATORY_CARE_PROVIDER_SITE_OTHER): Payer: 59

## 2013-03-27 DIAGNOSIS — J309 Allergic rhinitis, unspecified: Secondary | ICD-10-CM

## 2013-04-03 ENCOUNTER — Ambulatory Visit (INDEPENDENT_AMBULATORY_CARE_PROVIDER_SITE_OTHER): Payer: 59

## 2013-04-03 DIAGNOSIS — J309 Allergic rhinitis, unspecified: Secondary | ICD-10-CM

## 2013-04-10 ENCOUNTER — Ambulatory Visit (INDEPENDENT_AMBULATORY_CARE_PROVIDER_SITE_OTHER): Payer: 59

## 2013-04-10 DIAGNOSIS — J309 Allergic rhinitis, unspecified: Secondary | ICD-10-CM

## 2013-04-17 ENCOUNTER — Ambulatory Visit (INDEPENDENT_AMBULATORY_CARE_PROVIDER_SITE_OTHER): Payer: 59

## 2013-04-17 DIAGNOSIS — I2789 Other specified pulmonary heart diseases: Secondary | ICD-10-CM

## 2013-04-24 ENCOUNTER — Ambulatory Visit (INDEPENDENT_AMBULATORY_CARE_PROVIDER_SITE_OTHER): Payer: 59

## 2013-04-24 DIAGNOSIS — J309 Allergic rhinitis, unspecified: Secondary | ICD-10-CM

## 2013-05-01 ENCOUNTER — Ambulatory Visit (INDEPENDENT_AMBULATORY_CARE_PROVIDER_SITE_OTHER): Payer: 59

## 2013-05-01 DIAGNOSIS — J309 Allergic rhinitis, unspecified: Secondary | ICD-10-CM

## 2013-05-08 ENCOUNTER — Ambulatory Visit (INDEPENDENT_AMBULATORY_CARE_PROVIDER_SITE_OTHER): Payer: 59

## 2013-05-08 DIAGNOSIS — J309 Allergic rhinitis, unspecified: Secondary | ICD-10-CM

## 2013-05-15 ENCOUNTER — Ambulatory Visit (INDEPENDENT_AMBULATORY_CARE_PROVIDER_SITE_OTHER): Payer: 59

## 2013-05-15 DIAGNOSIS — J309 Allergic rhinitis, unspecified: Secondary | ICD-10-CM

## 2013-05-22 ENCOUNTER — Ambulatory Visit (INDEPENDENT_AMBULATORY_CARE_PROVIDER_SITE_OTHER): Payer: 59

## 2013-05-22 DIAGNOSIS — J309 Allergic rhinitis, unspecified: Secondary | ICD-10-CM

## 2013-05-29 ENCOUNTER — Ambulatory Visit (INDEPENDENT_AMBULATORY_CARE_PROVIDER_SITE_OTHER): Payer: 59

## 2013-05-29 DIAGNOSIS — J309 Allergic rhinitis, unspecified: Secondary | ICD-10-CM

## 2013-05-30 ENCOUNTER — Ambulatory Visit (INDEPENDENT_AMBULATORY_CARE_PROVIDER_SITE_OTHER): Payer: 59

## 2013-05-30 DIAGNOSIS — J309 Allergic rhinitis, unspecified: Secondary | ICD-10-CM

## 2013-06-05 ENCOUNTER — Ambulatory Visit (INDEPENDENT_AMBULATORY_CARE_PROVIDER_SITE_OTHER): Payer: 59

## 2013-06-05 DIAGNOSIS — J309 Allergic rhinitis, unspecified: Secondary | ICD-10-CM

## 2013-06-12 ENCOUNTER — Ambulatory Visit (INDEPENDENT_AMBULATORY_CARE_PROVIDER_SITE_OTHER): Payer: 59

## 2013-06-12 DIAGNOSIS — J309 Allergic rhinitis, unspecified: Secondary | ICD-10-CM

## 2013-06-14 ENCOUNTER — Ambulatory Visit (INDEPENDENT_AMBULATORY_CARE_PROVIDER_SITE_OTHER): Payer: 59 | Admitting: Internal Medicine

## 2013-06-14 ENCOUNTER — Ambulatory Visit (INDEPENDENT_AMBULATORY_CARE_PROVIDER_SITE_OTHER)
Admission: RE | Admit: 2013-06-14 | Discharge: 2013-06-14 | Disposition: A | Discharge status: Home / Self Care | Payer: 59 | Source: Ambulatory Visit | Attending: Internal Medicine | Admitting: Internal Medicine

## 2013-06-14 ENCOUNTER — Encounter: Payer: Self-pay | Admitting: Internal Medicine

## 2013-06-14 VITALS — BP 154/100 | HR 74 | Ht 74.0 in | Wt 222.0 lb

## 2013-06-14 DIAGNOSIS — R0609 Other forms of dyspnea: Secondary | ICD-10-CM

## 2013-06-14 DIAGNOSIS — J301 Allergic rhinitis due to pollen: Secondary | ICD-10-CM

## 2013-06-14 DIAGNOSIS — I4891 Unspecified atrial fibrillation: Secondary | ICD-10-CM

## 2013-06-14 NOTE — Patient Instructions (Signed)
Flu vax  Order- schedule PFT     Dx dyspnea on exertion              CXR- dx dyspnea on exertion

## 2013-06-14 NOTE — Progress Notes (Signed)
09/14/11- 60 yoM former smoker, followed for asthma, allergic rhinitis, complicated by AFib/PAT,  LOV-09/15/10 At work he is exposed to secondhand smoke which makes him sneeze. He has done well with maintenance allergy vaccine without need to change. In the last few weeks has resumed loratadine and Sudafed, questioning mold from wet ground. No asthma and a very rare need for rescue inhaler.  06/14/12- 60 yoM former smoker, followed for asthma, allergic rhinitis, complicated by AFib/PAT, Still on allergy vaccine 1:10 GH and doing well; states still has some stuffiness at times but better since last visit. On a long bike ride one week ago, he inhaled ? an insect. He began coughing immediately and still gets some persistent cough with clear mucus. Rescue inhaler has been some help.  06/14/13- 62 yoM former smoker, followed for asthma, allergic rhinitis, complicated by AFib/PAT, Still on allergy vaccine 1:10 GH FOLLOWS FOR: still on vaccine and doing well; denies any flare ups at this time. Easier DOE riding bike. No cough/ wheeze/ CP. Myocardial perfusion 07/26/12- EF 62% Allergy vaccine doing well. No hx of asbestos exposure- unexplained pleural plaques. CT chest 06/15/12 IMPRESSION:  1. The plain film abnormalities were likely secondary to partially  calcified pleural plaques bilaterally; indicative of asbestos  related pleural disease. No pulmonary nodules.  2. Mild centrilobular emphysema, without acute superimposed  process in the chest.  3. Esophageal air fluid level suggests dysmotility or  gastroesophageal reflux.  4. Multivessel coronary artery atherosclerosis.  Original Report Authenticated By: Consuello Bossier, M.D.  ROS-see HPI Constitutional:   No-   weight loss, night sweats, fevers, chills, fatigue, lassitude. HEENT:   No-  headaches, difficulty swallowing, tooth/dental problems, sore throat,       No-  sneezing, itching, ear ache,   + mildnasal congestion, post nasal drip,  CV:   No-   chest pain, orthopnea, PND, swelling in lower extremities, anasarca,  dizziness, palpitations Resp: +shortness of breath with exertion or at rest.              No-   productive cough,  No non-productive cough,  No- coughing up of blood.              No-   change in color of mucus.  No- wheezing.   Skin: No-   rash or lesions. GI:  No-   heartburn, indigestion, abdominal pain, nausea, GU: MS:  No-   joint pain or swelling.   Neuro-     nothing unusual Psych:  No- change in mood or affect. No depression or anxiety.  No memory loss.  OBJ General- Alert, Oriented, Affect-appropriate, Distress- none acute Skin- rash-none, lesions- none, excoriation- none. Chronic rhinophyma appearance Lymphadenopathy- none Head- atraumatic            Eyes- Gross vision intact, PERRLA, conjunctivae clear secretions            Ears- Hearing, canals-normal            Nose- turbinate edema, no-Septal dev, mucus, polyps, erosion, perforation             Throat- Mallampati II , mucosa clear , drainage- none, tonsils- atrophic Neck- flexible , trachea midline, no stridor , thyroid nl, carotid no bruit Chest - symmetrical excursion , unlabored           Heart/CV- RRR- rate controlled , no murmur , no gallop  , no rub, nl s1 s2                           -  JVD- none , edema- none, stasis changes- none, varices- none           Lung- +trace crackles in bases, wheeze- none, cough- none , dullness-none, rub- none           Chest wall-  Abd- Extrem- cyanosis- none, clubbing, none, atrophy- none, strength- nl Neuro- grossly intact to observation

## 2013-06-15 NOTE — Progress Notes (Signed)
Quick Note:  LMTCB ______ 

## 2013-06-18 ENCOUNTER — Telehealth: Payer: Self-pay | Admitting: Internal Medicine

## 2013-06-18 NOTE — Telephone Encounter (Signed)
Pt notified of cxr results per Dr Maple Hudson

## 2013-06-19 ENCOUNTER — Ambulatory Visit (INDEPENDENT_AMBULATORY_CARE_PROVIDER_SITE_OTHER): Payer: 59

## 2013-06-19 DIAGNOSIS — J309 Allergic rhinitis, unspecified: Secondary | ICD-10-CM

## 2013-06-26 ENCOUNTER — Ambulatory Visit (INDEPENDENT_AMBULATORY_CARE_PROVIDER_SITE_OTHER): Payer: 59

## 2013-06-26 DIAGNOSIS — J309 Allergic rhinitis, unspecified: Secondary | ICD-10-CM

## 2013-06-30 DIAGNOSIS — R0609 Other forms of dyspnea: Secondary | ICD-10-CM | POA: Insufficient documentation

## 2013-06-30 NOTE — Assessment & Plan Note (Signed)
Exam consistent w/ RSR today

## 2013-06-30 NOTE — Assessment & Plan Note (Signed)
Non-specific, and quite relative in this regular bicycle rider. Plan- Flu vax, PFT, CXR

## 2013-06-30 NOTE — Assessment & Plan Note (Signed)
He is satisfied to continue allergy vaccine 

## 2013-07-03 ENCOUNTER — Ambulatory Visit (INDEPENDENT_AMBULATORY_CARE_PROVIDER_SITE_OTHER): Payer: 59

## 2013-07-03 DIAGNOSIS — J309 Allergic rhinitis, unspecified: Secondary | ICD-10-CM

## 2013-07-05 ENCOUNTER — Other Ambulatory Visit: Payer: Self-pay

## 2013-07-10 ENCOUNTER — Ambulatory Visit (INDEPENDENT_AMBULATORY_CARE_PROVIDER_SITE_OTHER): Payer: 59

## 2013-07-10 DIAGNOSIS — J309 Allergic rhinitis, unspecified: Secondary | ICD-10-CM

## 2013-07-17 ENCOUNTER — Ambulatory Visit (INDEPENDENT_AMBULATORY_CARE_PROVIDER_SITE_OTHER): Payer: 59

## 2013-07-17 DIAGNOSIS — J309 Allergic rhinitis, unspecified: Secondary | ICD-10-CM

## 2013-07-24 ENCOUNTER — Ambulatory Visit (INDEPENDENT_AMBULATORY_CARE_PROVIDER_SITE_OTHER): Payer: 59

## 2013-07-24 DIAGNOSIS — J309 Allergic rhinitis, unspecified: Secondary | ICD-10-CM

## 2013-07-31 ENCOUNTER — Ambulatory Visit (INDEPENDENT_AMBULATORY_CARE_PROVIDER_SITE_OTHER): Payer: 59

## 2013-07-31 DIAGNOSIS — J309 Allergic rhinitis, unspecified: Secondary | ICD-10-CM

## 2013-08-07 ENCOUNTER — Ambulatory Visit: Payer: 59 | Admitting: Internal Medicine

## 2013-08-07 ENCOUNTER — Ambulatory Visit (INDEPENDENT_AMBULATORY_CARE_PROVIDER_SITE_OTHER): Payer: 59

## 2013-08-07 DIAGNOSIS — J309 Allergic rhinitis, unspecified: Secondary | ICD-10-CM

## 2013-08-14 ENCOUNTER — Ambulatory Visit (INDEPENDENT_AMBULATORY_CARE_PROVIDER_SITE_OTHER): Payer: 59

## 2013-08-14 DIAGNOSIS — J309 Allergic rhinitis, unspecified: Secondary | ICD-10-CM

## 2013-08-21 ENCOUNTER — Ambulatory Visit (INDEPENDENT_AMBULATORY_CARE_PROVIDER_SITE_OTHER): Payer: 59

## 2013-08-21 DIAGNOSIS — J309 Allergic rhinitis, unspecified: Secondary | ICD-10-CM

## 2013-08-28 ENCOUNTER — Ambulatory Visit (INDEPENDENT_AMBULATORY_CARE_PROVIDER_SITE_OTHER): Payer: 59

## 2013-08-28 DIAGNOSIS — J309 Allergic rhinitis, unspecified: Secondary | ICD-10-CM

## 2013-09-04 ENCOUNTER — Ambulatory Visit (INDEPENDENT_AMBULATORY_CARE_PROVIDER_SITE_OTHER): Payer: 59

## 2013-09-04 DIAGNOSIS — J309 Allergic rhinitis, unspecified: Secondary | ICD-10-CM

## 2013-09-06 ENCOUNTER — Encounter: Payer: Self-pay | Admitting: Internal Medicine

## 2013-09-11 ENCOUNTER — Ambulatory Visit (INDEPENDENT_AMBULATORY_CARE_PROVIDER_SITE_OTHER): Payer: 59

## 2013-09-11 DIAGNOSIS — J309 Allergic rhinitis, unspecified: Secondary | ICD-10-CM

## 2013-09-18 ENCOUNTER — Ambulatory Visit (INDEPENDENT_AMBULATORY_CARE_PROVIDER_SITE_OTHER): Payer: 59

## 2013-09-18 DIAGNOSIS — J309 Allergic rhinitis, unspecified: Secondary | ICD-10-CM

## 2013-09-24 ENCOUNTER — Ambulatory Visit (INDEPENDENT_AMBULATORY_CARE_PROVIDER_SITE_OTHER): Payer: 59 | Admitting: Internal Medicine

## 2013-09-24 ENCOUNTER — Other Ambulatory Visit (INDEPENDENT_AMBULATORY_CARE_PROVIDER_SITE_OTHER): Payer: 59

## 2013-09-24 ENCOUNTER — Encounter: Payer: Self-pay | Admitting: Internal Medicine

## 2013-09-24 VITALS — BP 150/90 | HR 80 | Temp 97.4°F | Resp 16 | Wt 222.0 lb

## 2013-09-24 DIAGNOSIS — Z0000 Encounter for general adult medical examination without abnormal findings: Secondary | ICD-10-CM

## 2013-09-24 DIAGNOSIS — I4891 Unspecified atrial fibrillation: Secondary | ICD-10-CM

## 2013-09-24 DIAGNOSIS — I2510 Atherosclerotic heart disease of native coronary artery without angina pectoris: Secondary | ICD-10-CM

## 2013-09-24 DIAGNOSIS — L719 Rosacea, unspecified: Secondary | ICD-10-CM

## 2013-09-24 DIAGNOSIS — I10 Essential (primary) hypertension: Secondary | ICD-10-CM

## 2013-09-24 DIAGNOSIS — I498 Other specified cardiac arrhythmias: Secondary | ICD-10-CM

## 2013-09-24 DIAGNOSIS — I471 Supraventricular tachycardia: Secondary | ICD-10-CM

## 2013-09-24 DIAGNOSIS — J45909 Unspecified asthma, uncomplicated: Secondary | ICD-10-CM

## 2013-09-24 LAB — CBC WITH DIFFERENTIAL/PLATELET
BASOS ABS: 0 10*3/uL (ref 0.0–0.1)
Basophils Relative: 0.6 % (ref 0.0–3.0)
EOS ABS: 0.7 10*3/uL (ref 0.0–0.7)
Eosinophils Relative: 9.3 % — ABNORMAL HIGH (ref 0.0–5.0)
HCT: 46.8 % (ref 39.0–52.0)
HEMOGLOBIN: 15.8 g/dL (ref 13.0–17.0)
LYMPHS ABS: 2 10*3/uL (ref 0.7–4.0)
LYMPHS PCT: 26.8 % (ref 12.0–46.0)
MCHC: 33.8 g/dL (ref 30.0–36.0)
MCV: 88.4 fl (ref 78.0–100.0)
MONO ABS: 0.6 10*3/uL (ref 0.1–1.0)
Monocytes Relative: 7.5 % (ref 3.0–12.0)
NEUTROS ABS: 4.3 10*3/uL (ref 1.4–7.7)
Neutrophils Relative %: 55.8 % (ref 43.0–77.0)
Platelets: 227 10*3/uL (ref 150.0–400.0)
RBC: 5.29 Mil/uL (ref 4.22–5.81)
RDW: 13.7 % (ref 11.5–14.6)
WBC: 7.6 10*3/uL (ref 4.5–10.5)

## 2013-09-24 LAB — BASIC METABOLIC PANEL
BUN: 17 mg/dL (ref 6–23)
CALCIUM: 9.6 mg/dL (ref 8.4–10.5)
CHLORIDE: 107 meq/L (ref 96–112)
CO2: 28 meq/L (ref 19–32)
Creatinine, Ser: 1.1 mg/dL (ref 0.4–1.5)
GFR: 75.39 mL/min (ref >60.00)
GLUCOSE: 89 mg/dL (ref 70–99)
Potassium: 4.3 mEq/L (ref 3.5–5.1)
SODIUM: 141 meq/L (ref 135–145)

## 2013-09-24 LAB — VITAMIN B12: VITAMIN B 12: 320 pg/mL (ref 211–911)

## 2013-09-24 LAB — URINALYSIS
BILIRUBIN URINE: NEGATIVE
HGB URINE DIPSTICK: NEGATIVE
LEUKOCYTES UA: NEGATIVE
NITRITE: NEGATIVE
Specific Gravity, Urine: 1.025 (ref 1.000–1.030)
Total Protein, Urine: NEGATIVE
UROBILINOGEN UA: 1 (ref 0.0–1.0)
Urine Glucose: NEGATIVE
pH: 6.5 (ref 5.0–8.0)

## 2013-09-24 LAB — HEPATIC FUNCTION PANEL
ALBUMIN: 4 g/dL (ref 3.5–5.2)
ALK PHOS: 69 U/L (ref 39–117)
ALT: 21 U/L (ref 0–53)
AST: 21 U/L (ref 0–37)
Bilirubin, Direct: 0.1 mg/dL (ref 0.0–0.3)
TOTAL PROTEIN: 6.9 g/dL (ref 6.0–8.3)
Total Bilirubin: 0.6 mg/dL (ref 0.3–1.2)

## 2013-09-24 LAB — LIPID PANEL
Cholesterol: 189 mg/dL (ref 0–200)
HDL: 47.8 mg/dL (ref >39.00)
LDL Cholesterol: 125 mg/dL — ABNORMAL HIGH (ref 0–99)
Total CHOL/HDL Ratio: 4
Triglycerides: 81 mg/dL (ref 0.0–149.0)
VLDL: 16.2 mg/dL (ref 0.0–40.0)

## 2013-09-24 LAB — TSH: TSH: 0.68 u[IU]/mL (ref 0.35–5.50)

## 2013-09-24 LAB — PSA: PSA: 0.66 ng/mL (ref 0.10–4.00)

## 2013-09-24 LAB — TESTOSTERONE: TESTOSTERONE: 313.55 ng/dL — AB (ref 350.00–890.00)

## 2013-09-24 MED ORDER — METOPROLOL SUCCINATE ER 50 MG PO TB24: 50.0000 mg | ORAL_TABLET | Freq: Every day | ORAL | Status: DC

## 2013-09-24 MED ORDER — DOXYCYCLINE HYCLATE 100 MG PO TABS: 100.0000 mg | ORAL_TABLET | Freq: Two times a day (BID) | ORAL | Status: DC

## 2013-09-24 NOTE — Assessment & Plan Note (Signed)
Continue with current prescription therapy as reflected on the Med list.  

## 2013-09-24 NOTE — Assessment & Plan Note (Signed)
We discussed age appropriate health related issues, including available/recomended screening tests and vaccinations. We discussed a need for adhering to healthy diet and exercise. Labs/EKG were reviewed/ordered. All questions were answered. Declined shots

## 2013-09-24 NOTE — Progress Notes (Signed)
Pre visit review using our clinic review tool, if applicable. No additional management support is needed unless otherwise documented below in the visit note. 

## 2013-09-24 NOTE — Progress Notes (Signed)
   Subjective:     HPI  The patient is here for a wellness exam. The patient has been doing well overall without major physical or psychological issues going on lately.  F/u hypertension, chronic palpitations, rosacea/acne controlled with medicines. BP has been a little up at home - SBP 130-140s.  BP Readings from Last 3 Encounters:  09/24/13 150/90  06/14/13 154/100  03/23/13 150/90   Wt Readings from Last 3 Encounters:  09/24/13 222 lb (100.699 kg)  06/14/13 222 lb (100.699 kg)  03/23/13 216 lb (97.977 kg)       yReview of Systems  Constitutional: Negative for appetite change, fatigue and unexpected weight change.  HENT: Negative for congestion, nosebleeds, sneezing, sore throat and trouble swallowing.   Eyes: Negative for itching and visual disturbance.  Respiratory: Negative for cough.   Cardiovascular: Negative for leg swelling.  Gastrointestinal: Negative for nausea, diarrhea, blood in stool and abdominal distention.  Genitourinary: Negative for frequency and hematuria.  Musculoskeletal: Negative for back pain, gait problem and joint swelling.  Skin: Positive for rash.  Neurological: Negative for dizziness, tremors, speech difficulty and weakness.  Psychiatric/Behavioral: Negative for sleep disturbance, dysphoric mood and agitation. The patient is not nervous/anxious.        Objective:   Physical Exam  Constitutional: He is oriented to person, place, and time. He appears well-developed.  HENT:  Mouth/Throat: Oropharynx is clear and moist.  Eyes: Conjunctivae are normal. Pupils are equal, round, and reactive to light.  Neck: Normal range of motion. No JVD present. No thyromegaly present.  Cardiovascular: Normal rate, regular rhythm, normal heart sounds and intact distal pulses.  Exam reveals no gallop and no friction rub.   No murmur heard. Pulmonary/Chest: Effort normal and breath sounds normal. No respiratory distress. He has no wheezes. He has no rales. He  exhibits no tenderness.  Abdominal: Soft. Bowel sounds are normal. He exhibits no distension and no mass. There is no tenderness. There is no rebound and no guarding.  Genitourinary: Rectum normal and prostate normal. Guaiac negative stool. No penile tenderness.  Musculoskeletal: Normal range of motion. He exhibits no edema and no tenderness.  Lymphadenopathy:    He has no cervical adenopathy.  Neurological: He is alert and oriented to person, place, and time. He has normal reflexes. No cranial nerve deficit. He exhibits normal muscle tone. Coordination normal.  Skin: Skin is warm and dry. Rash (rozacea) noted.  Psychiatric: He has a normal mood and affect. His behavior is normal. Judgment and thought content normal.  acne Lab Results  Component Value Date   WBC 7.4 09/16/2011   HGB 16.1 09/16/2011   HCT 46.9 09/16/2011   PLT 228.0 09/16/2011   GLUCOSE 95 09/14/2012   CHOL 190 09/16/2011   TRIG 90.0 09/16/2011   HDL 50.40 09/16/2011   LDLCALC 122* 09/16/2011   ALT 26 09/14/2012   AST 21 09/14/2012   NA 139 09/14/2012   K 4.8 09/14/2012   CL 104 09/14/2012   CREATININE 0.9 09/14/2012   BUN 20 09/14/2012   CO2 29 09/14/2012   TSH 0.65 09/14/2012   PSA 0.65 09/14/2012          Assessment & Plan:

## 2013-09-25 ENCOUNTER — Telehealth: Payer: Self-pay

## 2013-09-25 ENCOUNTER — Ambulatory Visit (INDEPENDENT_AMBULATORY_CARE_PROVIDER_SITE_OTHER): Payer: 59

## 2013-09-25 DIAGNOSIS — J309 Allergic rhinitis, unspecified: Secondary | ICD-10-CM

## 2013-09-25 LAB — NMR LIPOPROFILE WITHOUT LIPIDS
HDL Particle Number: 35.8 umol/L (ref >=30.5)
HDL SIZE: 8.7 nm — AB (ref >=9.2)
LDL Particle Number: 1730 nmol/L — ABNORMAL HIGH (ref <1000)
LDL Size: 21.2 nm (ref >20.5)
LP-IR Score: 70 — ABNORMAL HIGH (ref <=45)
Large HDL-P: 3.1 umol/L — ABNORMAL LOW (ref >=4.8)
Large VLDL-P: 3.3 nmol/L — ABNORMAL HIGH (ref <=2.7)
SMALL LDL PARTICLE NUMBER: 674 nmol/L — AB (ref <=527)
VLDL SIZE: 55.6 nm — AB (ref <=46.6)

## 2013-09-25 NOTE — Telephone Encounter (Signed)
Tracey from [employer] called and stated she needs the pt Cologuard test (at home colon ca screen test) order signed and re-faxed.  She says the org order they received was not signed.  Her callback -  [phone]

## 2013-09-26 ENCOUNTER — Ambulatory Visit (INDEPENDENT_AMBULATORY_CARE_PROVIDER_SITE_OTHER): Payer: 59

## 2013-09-26 DIAGNOSIS — J309 Allergic rhinitis, unspecified: Secondary | ICD-10-CM

## 2013-09-26 NOTE — Telephone Encounter (Signed)
MD's signature is missing. John Boone is faxing form back to sign.

## 2013-10-02 ENCOUNTER — Ambulatory Visit (INDEPENDENT_AMBULATORY_CARE_PROVIDER_SITE_OTHER): Payer: 59

## 2013-10-02 DIAGNOSIS — J309 Allergic rhinitis, unspecified: Secondary | ICD-10-CM

## 2013-10-09 ENCOUNTER — Ambulatory Visit (INDEPENDENT_AMBULATORY_CARE_PROVIDER_SITE_OTHER): Payer: 59

## 2013-10-09 DIAGNOSIS — J309 Allergic rhinitis, unspecified: Secondary | ICD-10-CM

## 2013-10-16 ENCOUNTER — Ambulatory Visit: Payer: 59

## 2013-10-17 ENCOUNTER — Ambulatory Visit (INDEPENDENT_AMBULATORY_CARE_PROVIDER_SITE_OTHER): Payer: 59

## 2013-10-17 DIAGNOSIS — J309 Allergic rhinitis, unspecified: Secondary | ICD-10-CM

## 2013-10-23 ENCOUNTER — Ambulatory Visit: Payer: 59

## 2013-10-24 ENCOUNTER — Ambulatory Visit (INDEPENDENT_AMBULATORY_CARE_PROVIDER_SITE_OTHER): Payer: 59

## 2013-10-24 DIAGNOSIS — J309 Allergic rhinitis, unspecified: Secondary | ICD-10-CM

## 2013-10-26 ENCOUNTER — Telehealth: Payer: Self-pay | Admitting: Internal Medicine

## 2013-10-26 MED ORDER — AMOXICILLIN-POT CLAVULANATE 875-125 MG PO TABS: 1.0000 | ORAL_TABLET | Freq: Two times a day (BID) | ORAL | Status: DC

## 2013-10-26 NOTE — Telephone Encounter (Signed)
Offer augmentin 875 mg, # 14, 1 twice daily 

## 2013-10-26 NOTE — Telephone Encounter (Signed)
Called made pt aware of recs. rx sent. Nothing further needed

## 2013-10-26 NOTE — Telephone Encounter (Signed)
Called spoke w/' pt. C/o prod cough w/ clear-tan colored phlem, PND, nasal congestion, blowing out clear-milky phlem x December getting worse. Reports he is taking clairitin and sudafed. Please advise Dr. Annamaria Boots thanks  Allergies  Allergen Reactions  . Cardizem [Diltiazem Hcl]     tired  . Losartan     Felt bad, sweaty  . Meloxicam     fatigue  . Pravastatin     Heart flutter  . Verapamil     Not an allergic reaction    Current Outpatient Prescriptions on File Prior to Visit  Medication Sig Dispense Refill  . albuterol (PROVENTIL HFA) 108 (90 BASE) MCG/ACT inhaler Inhale 2 puffs into the lungs 4 (four) times daily as needed for shortness of breath.  1 Inhaler  6  . aspirin 325 MG tablet Take 325 mg by mouth daily.        . Cholecalciferol 1000 UNITS tablet Take 1,000 Units by mouth daily.        Marland Kitchen doxycycline (VIBRA-TABS) 100 MG tablet Take 1 tablet (100 mg total) by mouth 2 (two) times daily.  60 tablet  5  . loratadine (CLARITIN) 10 MG tablet Take 1 tablet (10 mg total) by mouth daily. For allergies.  30 tablet  prn  . metoprolol succinate (TOPROL-XL) 50 MG 24 hr tablet Take 1 tablet (50 mg total) by mouth daily. Take with or immediately following a meal.  30 tablet  11  . NON FORMULARY Inject into the skin. Allergy Shots 1:10 GH        No current facility-administered medications on file prior to visit.

## 2013-10-30 ENCOUNTER — Ambulatory Visit (INDEPENDENT_AMBULATORY_CARE_PROVIDER_SITE_OTHER): Payer: 59

## 2013-10-30 DIAGNOSIS — J309 Allergic rhinitis, unspecified: Secondary | ICD-10-CM

## 2013-10-31 ENCOUNTER — Encounter: Payer: Self-pay | Admitting: Internal Medicine

## 2013-10-31 ENCOUNTER — Ambulatory Visit (INDEPENDENT_AMBULATORY_CARE_PROVIDER_SITE_OTHER): Payer: 59 | Admitting: Internal Medicine

## 2013-10-31 VITALS — BP 158/90 | HR 90 | Ht 74.0 in | Wt 228.8 lb

## 2013-10-31 DIAGNOSIS — J948 Other specified pleural conditions: Secondary | ICD-10-CM

## 2013-10-31 DIAGNOSIS — J301 Allergic rhinitis due to pollen: Secondary | ICD-10-CM

## 2013-10-31 DIAGNOSIS — R091 Pleurisy: Secondary | ICD-10-CM

## 2013-10-31 MED ORDER — METHYLPREDNISOLONE ACETATE 80 MG/ML IJ SUSP
80.0000 mg | Freq: Once | INTRAMUSCULAR | Status: AC
  Administered 2013-10-31: 80 mg via INTRAMUSCULAR

## 2013-10-31 MED ORDER — PHENYLEPHRINE HCL 1 % NA SOLN: 3.0000 [drp] | Freq: Once | NASAL | Status: DC

## 2013-10-31 NOTE — Progress Notes (Signed)
09/14/11- 60 yoM former smoker, followed for asthma, allergic rhinitis, complicated by AFib/PAT,  LOV-09/15/10 At work he is exposed to secondhand smoke which makes him sneeze. He has done well with maintenance allergy vaccine without need to change. In the last few weeks has resumed loratadine and Sudafed, questioning mold from wet ground. No asthma and a very rare need for rescue inhaler.  06/14/12- 60 yoM former smoker, followed for asthma, allergic rhinitis, complicated by AFib/PAT, Still on allergy vaccine 1:10 GH and doing well; states still has some stuffiness at times but better since last visit. On a long bike ride one week ago, he inhaled ? an insect. He began coughing immediately and still gets some persistent cough with clear mucus. Rescue inhaler has been some help.  06/14/13- 62 yoM former smoker, followed for asthma, allergic rhinitis, complicated by AFib/PAT, Still on allergy vaccine 1:10 GH FOLLOWS FOR: still on vaccine and doing well; denies any flare ups at this time. Easier DOE riding bike. No cough/ wheeze/ CP. Myocardial perfusion 07/26/12- EF 62% Allergy vaccine doing well. No hx of asbestos exposure- unexplained pleural plaques. CT chest 06/15/12 IMPRESSION:  1. The plain film abnormalities were likely secondary to partially  calcified pleural plaques bilaterally; indicative of asbestos  related pleural disease. No pulmonary nodules.  2. Mild centrilobular emphysema, without acute superimposed  process in the chest.  3. Esophageal air fluid level suggests dysmotility or  gastroesophageal reflux.  4. Multivessel coronary artery atherosclerosis.  Original Report Authenticated By: Areta Haber, M.D.  10/31/13- 34 yoM former smoker, followed for asthma, allergic rhinitis, pleural plaques(unexplained), complicated by AFib/PAT/ CAD, ACUTE VISIT: lots of sinus drainage in am-thick. Chest congestion("feels like bronchitis"). SOB as well. Was given Amoxicllin last week and no  help. Has been using Sudafed during the day-helps open sinus area Still on allergy vaccine 1:10 GH Nasal congestion with thick white since a cold in December. Early transient headache. No fever. Recently some cough. Using saline rinse, sudafed. Claritin x 2 weeks had little effect.Day 5/7 Augmentin. He again denies any knowledge of any previous asbestos exposure. CXR 06/11/13 IMPRESSION:  No acute finding. Calcified pleural plaques.  Electronically Signed  By: Inge Rise M.D.  On: 06/14/2013 16:21  ROS-see HPI Constitutional:   No-   weight loss, night sweats, fevers, chills, fatigue, lassitude. HEENT:   + headaches, difficulty swallowing, tooth/dental problems, sore throat,       No-  sneezing, itching, ear ache,   + mild nasal congestion, post nasal drip,  CV:  No-   chest pain, orthopnea, PND, swelling in lower extremities, anasarca,  dizziness, palpitations Resp: +shortness of breath with exertion or at rest.              No-   productive cough,  No non-productive cough,  No- coughing up of blood.              No-   change in color of mucus.  No- wheezing.   Skin: No-   rash or lesions. GI:  No-   heartburn, indigestion, abdominal pain, nausea, GU: MS:  No-   joint pain or swelling.   Neuro-     nothing unusual Psych:  No- change in mood or affect. No depression or anxiety.  No memory loss.  OBJ General- Alert, Oriented, Affect-appropriate, Distress- none acute Skin- rash-none, lesions- none, excoriation- none. +Chronic rhinophyma appearance Lymphadenopathy- none Head- atraumatic            Eyes- Gross vision intact,  PERRLA, conjunctivae clear secretions            Ears- Hearing, canals-normal            Nose- + sl nasal turbinate edema, no-Septal dev, mucus, polyps, erosion, perforation             Throat- Mallampati II , mucosa clear , drainage- none, tonsils- atrophic Neck- flexible , trachea midline, no stridor , thyroid nl, carotid no bruit Chest - symmetrical  excursion , unlabored           Heart/CV- RRR- rate controlled , no murmur , no gallop  , no rub, nl s1 s2                           - JVD- none , edema- none, stasis changes- none, varices- none           Lung- clear, wheeze- none, cough- none , dullness-none, rub- none           Chest wall-  Abd- Extrem- cyanosis- none, clubbing, none, atrophy- none, strength- nl Neuro- grossly intact to observation

## 2013-10-31 NOTE — Patient Instructions (Signed)
Finish the augmentin then we will see how you are doing. Sometimes infections are very slow to clear.  Ok to continue the morning Sudafed, and the occasional saline rinse as needed  Ok to try adding Allegra/ fexofenadine 180 mg, once daily to see if it makes you more comfortable by drying you up some.  Neb neo nasal  Depo 80

## 2013-11-06 ENCOUNTER — Ambulatory Visit (INDEPENDENT_AMBULATORY_CARE_PROVIDER_SITE_OTHER): Payer: 59

## 2013-11-06 DIAGNOSIS — J309 Allergic rhinitis, unspecified: Secondary | ICD-10-CM

## 2013-11-12 ENCOUNTER — Encounter: Payer: Self-pay | Admitting: Internal Medicine

## 2013-11-12 ENCOUNTER — Ambulatory Visit (INDEPENDENT_AMBULATORY_CARE_PROVIDER_SITE_OTHER): Payer: 59 | Admitting: Internal Medicine

## 2013-11-12 ENCOUNTER — Ambulatory Visit (INDEPENDENT_AMBULATORY_CARE_PROVIDER_SITE_OTHER)
Admission: RE | Admit: 2013-11-12 | Discharge: 2013-11-12 | Disposition: A | Discharge status: Home / Self Care | Payer: 59 | Source: Ambulatory Visit | Attending: Internal Medicine | Admitting: Internal Medicine

## 2013-11-12 VITALS — BP 144/102 | HR 87 | Temp 99.4°F | Resp 16 | Ht 74.0 in | Wt 223.1 lb

## 2013-11-12 DIAGNOSIS — R059 Cough, unspecified: Secondary | ICD-10-CM

## 2013-11-12 DIAGNOSIS — R05 Cough: Secondary | ICD-10-CM

## 2013-11-12 DIAGNOSIS — I10 Essential (primary) hypertension: Secondary | ICD-10-CM

## 2013-11-12 DIAGNOSIS — J209 Acute bronchitis, unspecified: Secondary | ICD-10-CM

## 2013-11-12 DIAGNOSIS — J45909 Unspecified asthma, uncomplicated: Secondary | ICD-10-CM

## 2013-11-12 MED ORDER — HYDROCODONE-HOMATROPINE 5-1.5 MG/5ML PO SYRP: 5.0000 mL | ORAL_SOLUTION | Freq: Three times a day (TID) | ORAL | Status: DC | PRN

## 2013-11-12 MED ORDER — HYDROCHLOROTHIAZIDE 12.5 MG PO CAPS: 12.5000 mg | ORAL_CAPSULE | Freq: Every day | ORAL | Status: DC

## 2013-11-12 NOTE — Progress Notes (Signed)
Pre visit review using our clinic review tool, if applicable. No additional management support is needed unless otherwise documented below in the visit note. 

## 2013-11-12 NOTE — Patient Instructions (Signed)

## 2013-11-13 ENCOUNTER — Encounter: Payer: Self-pay | Admitting: Internal Medicine

## 2013-11-13 ENCOUNTER — Ambulatory Visit (INDEPENDENT_AMBULATORY_CARE_PROVIDER_SITE_OTHER): Payer: 59

## 2013-11-13 DIAGNOSIS — J309 Allergic rhinitis, unspecified: Secondary | ICD-10-CM

## 2013-11-13 NOTE — Assessment & Plan Note (Signed)
He has a mild flare of asthma He did not want to receive a steroid infection b/c he just recently got one He will not start using a LABA-ICS inhaler due to a prior bad experience with advair

## 2013-11-13 NOTE — Assessment & Plan Note (Signed)
His CR is normal

## 2013-11-13 NOTE — Assessment & Plan Note (Signed)
His CXR is normal I think this is viral so no antibiotics were prescribed He will take hycodan as needed for the cough

## 2013-11-13 NOTE — Assessment & Plan Note (Signed)
His BP is not well controlled, in part due to his use of sudafed - I have asked him to stop taking sudafed He will not take an ARB or ACEI due to a bad previous experience with an ARB I have asked him to add HCTZ for better BP control

## 2013-11-13 NOTE — Progress Notes (Signed)
Subjective:    Patient ID: John Boone, male    DOB: 27-Jul-1951, 63 y.o.   MRN: [id number]  Cough This is a new problem. The current episode started in the past 7 days. The problem has been rapidly improving. The problem occurs every few hours. The cough is non-productive (he had one episode of bloody phlegm 3 days ago but none since). Associated symptoms include chills, a fever, a sore throat and wheezing. Pertinent negatives include no chest pain, ear congestion, ear pain, headaches, heartburn, hemoptysis, myalgias, nasal congestion, postnasal drip, rash, rhinorrhea, shortness of breath, sweats or weight loss. He has tried OTC cough suppressant (sudafed) for the symptoms. The treatment provided mild relief. His past medical history is significant for asthma. There is no history of bronchiectasis, bronchitis, COPD, emphysema, environmental allergies or pneumonia.  Hypertension This is a chronic problem. The current episode started more than 1 year ago. The problem has been gradually worsening since onset. The problem is uncontrolled. Pertinent negatives include no anxiety, blurred vision, chest pain, headaches, malaise/fatigue, neck pain, orthopnea, palpitations, peripheral edema, PND, shortness of breath or sweats. Agents associated with hypertension include decongestants. Past treatments include beta blockers. The current treatment provides moderate improvement. There are no compliance problems.  Hypertensive end-organ damage includes CAD/MI.      Review of Systems  Constitutional: Positive for fever and chills. Negative for weight loss, malaise/fatigue, diaphoresis, activity change, appetite change, fatigue and unexpected weight change.  HENT: Positive for sore throat. Negative for ear pain, nosebleeds, postnasal drip, rhinorrhea, sinus pressure, trouble swallowing and voice change.   Eyes: Negative.  Negative for blurred vision.  Respiratory: Positive for cough and wheezing. Negative for  apnea, hemoptysis, choking, chest tightness and shortness of breath.   Cardiovascular: Negative for chest pain, palpitations, orthopnea, leg swelling and PND.  Gastrointestinal: Negative.  Negative for heartburn, nausea, vomiting, abdominal pain, diarrhea and constipation.  Endocrine: Negative.   Genitourinary: Negative.   Musculoskeletal: Negative.  Negative for myalgias and neck pain.  Skin: Negative.  Negative for rash.  Allergic/Immunologic: Negative.  Negative for environmental allergies.  Neurological: Negative.  Negative for dizziness and headaches.  Hematological: Negative.  Negative for adenopathy. Does not bruise/bleed easily.  Psychiatric/Behavioral: Negative.        Objective:   Physical Exam  Vitals reviewed. Constitutional: He is oriented to person, place, and time. He appears well-developed and well-nourished.  Non-toxic appearance. He does not have a sickly appearance. He does not appear ill. No distress.  HENT:  Head: Normocephalic and atraumatic.  Mouth/Throat: Oropharynx is clear and moist. No oropharyngeal exudate.  Eyes: Conjunctivae are normal. Right eye exhibits no discharge. Left eye exhibits no discharge. No scleral icterus.  Neck: Normal range of motion. Neck supple. No JVD present. No tracheal deviation present. No thyromegaly present.  Cardiovascular: Normal rate, regular rhythm, normal heart sounds and intact distal pulses.  Exam reveals no gallop and no friction rub.   No murmur heard. Pulmonary/Chest: Effort normal. No accessory muscle usage or stridor. Not tachypneic. No respiratory distress. He has no decreased breath sounds. He has wheezes in the right middle field and the left middle field. He has no rhonchi. He has no rales. He exhibits no tenderness.  He has rare faint exp wheezes in both mid-zones  Abdominal: Soft. Bowel sounds are normal. He exhibits no distension and no mass. There is no tenderness. There is no rebound and no guarding.    Musculoskeletal: Normal range of motion. He exhibits no edema  and no tenderness.  Lymphadenopathy:    He has no cervical adenopathy.  Neurological: He is oriented to person, place, and time.  Skin: Skin is warm and dry. No rash noted. He is not diaphoretic. No erythema. No pallor.  Psychiatric: He has a normal mood and affect. His behavior is normal. Judgment and thought content normal.     Lab Results  Component Value Date   WBC 7.6 09/24/2013   HGB 15.8 09/24/2013   HCT 46.8 09/24/2013   PLT 227.0 09/24/2013   GLUCOSE 89 09/24/2013   CHOL 189 09/24/2013   TRIG 81.0 09/24/2013   HDL 47.80 09/24/2013   LDLCALC 125* 09/24/2013   ALT 21 09/24/2013   AST 21 09/24/2013   NA 141 09/24/2013   K 4.3 09/24/2013   CL 107 09/24/2013   CREATININE 1.1 09/24/2013   BUN 17 09/24/2013   CO2 28 09/24/2013   TSH 0.68 09/24/2013   PSA 0.66 09/24/2013       Assessment & Plan:

## 2013-11-19 DIAGNOSIS — J948 Other specified pleural conditions: Secondary | ICD-10-CM | POA: Insufficient documentation

## 2013-11-19 NOTE — Assessment & Plan Note (Signed)
Okay to continue allergy vaccine 1:10 GH Plan-Depo-Medrol, nasal nebulizer decongestant, finish Augmentin. Try  Allegra instead of Claritin

## 2013-11-19 NOTE — Assessment & Plan Note (Signed)
Plan-follow with occasional chest x-ray as needed

## 2013-11-20 ENCOUNTER — Ambulatory Visit (INDEPENDENT_AMBULATORY_CARE_PROVIDER_SITE_OTHER): Payer: 59

## 2013-11-20 DIAGNOSIS — J309 Allergic rhinitis, unspecified: Secondary | ICD-10-CM

## 2013-11-26 ENCOUNTER — Ambulatory Visit: Payer: 59

## 2013-11-27 ENCOUNTER — Ambulatory Visit (INDEPENDENT_AMBULATORY_CARE_PROVIDER_SITE_OTHER): Payer: 59

## 2013-11-27 DIAGNOSIS — J309 Allergic rhinitis, unspecified: Secondary | ICD-10-CM

## 2013-12-03 ENCOUNTER — Telehealth: Payer: Self-pay | Admitting: Internal Medicine

## 2013-12-03 MED ORDER — MOMETASONE FUROATE 50 MCG/ACT NA SUSP: 2.0000 | Freq: Every day | NASAL | Status: DC

## 2013-12-03 NOTE — Telephone Encounter (Signed)
Pt is asking RX for nasonex. He reports had had old RX and the RX has not expired.  Not on pt current medlist. Please advise Dr. Annamaria Boots thanks

## 2013-12-03 NOTE — Telephone Encounter (Signed)
Pt aware RX hs been called in. Nothing further needed

## 2013-12-03 NOTE — Telephone Encounter (Signed)
Yes this is okay to refill. Thanks.

## 2013-12-04 ENCOUNTER — Ambulatory Visit (INDEPENDENT_AMBULATORY_CARE_PROVIDER_SITE_OTHER): Payer: 59

## 2013-12-04 DIAGNOSIS — J309 Allergic rhinitis, unspecified: Secondary | ICD-10-CM

## 2013-12-11 ENCOUNTER — Ambulatory Visit (INDEPENDENT_AMBULATORY_CARE_PROVIDER_SITE_OTHER): Payer: 59

## 2013-12-11 DIAGNOSIS — J309 Allergic rhinitis, unspecified: Secondary | ICD-10-CM

## 2013-12-18 ENCOUNTER — Ambulatory Visit (INDEPENDENT_AMBULATORY_CARE_PROVIDER_SITE_OTHER): Payer: 59

## 2013-12-18 DIAGNOSIS — J309 Allergic rhinitis, unspecified: Secondary | ICD-10-CM

## 2013-12-25 ENCOUNTER — Ambulatory Visit (INDEPENDENT_AMBULATORY_CARE_PROVIDER_SITE_OTHER): Payer: 59

## 2013-12-25 DIAGNOSIS — J309 Allergic rhinitis, unspecified: Secondary | ICD-10-CM

## 2014-01-01 ENCOUNTER — Ambulatory Visit (INDEPENDENT_AMBULATORY_CARE_PROVIDER_SITE_OTHER): Payer: 59

## 2014-01-01 DIAGNOSIS — J309 Allergic rhinitis, unspecified: Secondary | ICD-10-CM

## 2014-01-08 ENCOUNTER — Ambulatory Visit (INDEPENDENT_AMBULATORY_CARE_PROVIDER_SITE_OTHER): Payer: 59

## 2014-01-08 DIAGNOSIS — J309 Allergic rhinitis, unspecified: Secondary | ICD-10-CM

## 2014-01-15 ENCOUNTER — Ambulatory Visit (INDEPENDENT_AMBULATORY_CARE_PROVIDER_SITE_OTHER): Payer: 59

## 2014-01-15 DIAGNOSIS — J309 Allergic rhinitis, unspecified: Secondary | ICD-10-CM

## 2014-01-22 ENCOUNTER — Ambulatory Visit (INDEPENDENT_AMBULATORY_CARE_PROVIDER_SITE_OTHER): Payer: 59

## 2014-01-22 DIAGNOSIS — J309 Allergic rhinitis, unspecified: Secondary | ICD-10-CM

## 2014-01-24 ENCOUNTER — Ambulatory Visit (INDEPENDENT_AMBULATORY_CARE_PROVIDER_SITE_OTHER): Payer: 59

## 2014-01-24 DIAGNOSIS — J309 Allergic rhinitis, unspecified: Secondary | ICD-10-CM

## 2014-01-29 ENCOUNTER — Encounter: Payer: Self-pay | Admitting: Internal Medicine

## 2014-01-29 ENCOUNTER — Ambulatory Visit (INDEPENDENT_AMBULATORY_CARE_PROVIDER_SITE_OTHER): Payer: 59

## 2014-01-29 DIAGNOSIS — J309 Allergic rhinitis, unspecified: Secondary | ICD-10-CM

## 2014-02-05 ENCOUNTER — Ambulatory Visit (INDEPENDENT_AMBULATORY_CARE_PROVIDER_SITE_OTHER): Payer: 59

## 2014-02-05 DIAGNOSIS — J309 Allergic rhinitis, unspecified: Secondary | ICD-10-CM

## 2014-02-12 ENCOUNTER — Ambulatory Visit (INDEPENDENT_AMBULATORY_CARE_PROVIDER_SITE_OTHER): Payer: 59

## 2014-02-12 DIAGNOSIS — J309 Allergic rhinitis, unspecified: Secondary | ICD-10-CM

## 2014-02-18 ENCOUNTER — Ambulatory Visit: Payer: 59

## 2014-02-19 ENCOUNTER — Ambulatory Visit (INDEPENDENT_AMBULATORY_CARE_PROVIDER_SITE_OTHER): Payer: 59

## 2014-02-19 DIAGNOSIS — J309 Allergic rhinitis, unspecified: Secondary | ICD-10-CM

## 2014-02-23 ENCOUNTER — Other Ambulatory Visit: Payer: Self-pay | Admitting: Internal Medicine

## 2014-02-26 ENCOUNTER — Ambulatory Visit (INDEPENDENT_AMBULATORY_CARE_PROVIDER_SITE_OTHER): Payer: 59

## 2014-02-26 DIAGNOSIS — J309 Allergic rhinitis, unspecified: Secondary | ICD-10-CM

## 2014-03-05 ENCOUNTER — Ambulatory Visit (INDEPENDENT_AMBULATORY_CARE_PROVIDER_SITE_OTHER): Payer: 59

## 2014-03-05 DIAGNOSIS — J309 Allergic rhinitis, unspecified: Secondary | ICD-10-CM

## 2014-03-12 ENCOUNTER — Ambulatory Visit (INDEPENDENT_AMBULATORY_CARE_PROVIDER_SITE_OTHER): Payer: 59

## 2014-03-12 DIAGNOSIS — J309 Allergic rhinitis, unspecified: Secondary | ICD-10-CM

## 2014-03-19 ENCOUNTER — Ambulatory Visit (INDEPENDENT_AMBULATORY_CARE_PROVIDER_SITE_OTHER): Payer: 59 | Admitting: Internal Medicine

## 2014-03-19 ENCOUNTER — Ambulatory Visit (INDEPENDENT_AMBULATORY_CARE_PROVIDER_SITE_OTHER): Payer: 59

## 2014-03-19 ENCOUNTER — Encounter: Payer: Self-pay | Admitting: Internal Medicine

## 2014-03-19 VITALS — BP 150/110 | HR 65 | Temp 98.2°F | Ht 74.0 in | Wt 224.8 lb

## 2014-03-19 DIAGNOSIS — I10 Essential (primary) hypertension: Secondary | ICD-10-CM

## 2014-03-19 DIAGNOSIS — I2583 Coronary atherosclerosis due to lipid rich plaque: Secondary | ICD-10-CM

## 2014-03-19 DIAGNOSIS — J309 Allergic rhinitis, unspecified: Secondary | ICD-10-CM

## 2014-03-19 DIAGNOSIS — J45909 Unspecified asthma, uncomplicated: Secondary | ICD-10-CM

## 2014-03-19 DIAGNOSIS — J301 Allergic rhinitis due to pollen: Secondary | ICD-10-CM

## 2014-03-19 DIAGNOSIS — R002 Palpitations: Secondary | ICD-10-CM

## 2014-03-19 DIAGNOSIS — I2510 Atherosclerotic heart disease of native coronary artery without angina pectoris: Secondary | ICD-10-CM

## 2014-03-19 DIAGNOSIS — E785 Hyperlipidemia, unspecified: Secondary | ICD-10-CM

## 2014-03-19 MED ORDER — METOPROLOL SUCCINATE ER 25 MG PO TB24: 25.0000 mg | ORAL_TABLET | Freq: Two times a day (BID) | ORAL | Status: DC

## 2014-03-19 NOTE — Assessment & Plan Note (Signed)
Try Metoprool 25 mg bid if tolerated

## 2014-03-19 NOTE — Assessment & Plan Note (Signed)
Continue with current prescription therapy as reflected on the Med list.  

## 2014-03-19 NOTE — Assessment & Plan Note (Signed)
Continue with current PRN MDI prescription therapy as reflected on the Med list - rare.

## 2014-03-19 NOTE — Progress Notes (Signed)
   Subjective:     Hypertension This is a chronic problem. The problem has been waxing and waning since onset. The problem is uncontrolled. Pertinent negatives include no chest pain, neck pain or palpitations.   F/u hypertension, chronic palpitations, rosacea controlled with medicines. BP has been a little up at home - SBP is never above 160... SBP 130-140s at home. He stopped Cardizem CD again - not tired now  BP Readings from Last 3 Encounters:  03/19/14 150/110  11/12/13 144/102  10/31/13 158/90   Wt Readings from Last 3 Encounters:  03/19/14 224 lb 12.8 oz (101.969 kg)  11/12/13 223 lb 1.9 oz (101.207 kg)  10/31/13 228 lb 12.8 oz (103.783 kg)       yReview of Systems  Constitutional: Negative for appetite change, fatigue and unexpected weight change.  HENT: Negative for congestion, nosebleeds, sneezing, sore throat and trouble swallowing.   Eyes: Negative for itching and visual disturbance.  Respiratory: Negative for cough.   Cardiovascular: Negative for chest pain, palpitations and leg swelling.  Gastrointestinal: Negative for nausea, diarrhea, blood in stool and abdominal distention.  Genitourinary: Negative for frequency and hematuria.  Musculoskeletal: Negative for back pain, gait problem, joint swelling and neck pain.  Skin: Positive for rash.  Neurological: Negative for dizziness, tremors, speech difficulty and weakness.  Psychiatric/Behavioral: Negative for sleep disturbance, dysphoric mood and agitation. The patient is not nervous/anxious.        Objective:   Physical Exam  Constitutional: He is oriented to person, place, and time. He appears well-developed.  HENT:  Mouth/Throat: Oropharynx is clear and moist.  Eyes: Conjunctivae are normal. Pupils are equal, round, and reactive to light.  Neck: Normal range of motion. No JVD present. No thyromegaly present.  Cardiovascular: Normal rate, regular rhythm, normal heart sounds and intact distal pulses.  Exam  reveals no gallop and no friction rub.   No murmur heard. Pulmonary/Chest: Effort normal and breath sounds normal. No respiratory distress. He has no wheezes. He has no rales. He exhibits no tenderness.  Abdominal: Soft. Bowel sounds are normal. He exhibits no distension and no mass. There is no tenderness. There is no rebound and no guarding.  Genitourinary: Prostate normal. Guaiac negative stool. No penile tenderness.  Musculoskeletal: Normal range of motion. He exhibits no edema and no tenderness.  Lymphadenopathy:    He has no cervical adenopathy.  Neurological: He is alert and oriented to person, place, and time. He has normal reflexes. No cranial nerve deficit. He exhibits normal muscle tone. Coordination normal.  Skin: Skin is warm and dry. Rash (rozacea) noted.  Psychiatric: He has a normal mood and affect. His behavior is normal. Judgment and thought content normal.   Lab Results  Component Value Date   WBC 7.6 09/24/2013   HGB 15.8 09/24/2013   HCT 46.8 09/24/2013   PLT 227.0 09/24/2013   GLUCOSE 89 09/24/2013   CHOL 189 09/24/2013   TRIG 81.0 09/24/2013   HDL 47.80 09/24/2013   LDLCALC 125* 09/24/2013   ALT 21 09/24/2013   AST 21 09/24/2013   NA 141 09/24/2013   K 4.3 09/24/2013   CL 107 09/24/2013   CREATININE 1.1 09/24/2013   BUN 17 09/24/2013   CO2 28 09/24/2013   TSH 0.68 09/24/2013   PSA 0.66 09/24/2013          Assessment & Plan:

## 2014-03-19 NOTE — Assessment & Plan Note (Signed)
No relapse 

## 2014-03-19 NOTE — Assessment & Plan Note (Signed)
Statin intolerant 

## 2014-03-19 NOTE — Progress Notes (Signed)
Pre visit review using our clinic review tool, if applicable. No additional management support is needed unless otherwise documented below in the visit note. 

## 2014-03-20 ENCOUNTER — Telehealth: Payer: Self-pay | Admitting: Internal Medicine

## 2014-03-20 NOTE — Telephone Encounter (Signed)
Relevant patient education assigned to patient using Emmi. ° °

## 2014-03-25 ENCOUNTER — Ambulatory Visit: Payer: 59 | Admitting: Internal Medicine

## 2014-03-26 ENCOUNTER — Ambulatory Visit (INDEPENDENT_AMBULATORY_CARE_PROVIDER_SITE_OTHER): Payer: 59

## 2014-03-26 DIAGNOSIS — J309 Allergic rhinitis, unspecified: Secondary | ICD-10-CM

## 2014-04-02 ENCOUNTER — Ambulatory Visit (INDEPENDENT_AMBULATORY_CARE_PROVIDER_SITE_OTHER): Payer: 59

## 2014-04-02 DIAGNOSIS — J309 Allergic rhinitis, unspecified: Secondary | ICD-10-CM

## 2014-04-09 ENCOUNTER — Encounter: Payer: Self-pay | Admitting: Internal Medicine

## 2014-04-09 ENCOUNTER — Ambulatory Visit (INDEPENDENT_AMBULATORY_CARE_PROVIDER_SITE_OTHER): Payer: 59

## 2014-04-09 DIAGNOSIS — J309 Allergic rhinitis, unspecified: Secondary | ICD-10-CM

## 2014-04-16 ENCOUNTER — Ambulatory Visit (INDEPENDENT_AMBULATORY_CARE_PROVIDER_SITE_OTHER): Payer: 59

## 2014-04-16 DIAGNOSIS — J309 Allergic rhinitis, unspecified: Secondary | ICD-10-CM

## 2014-04-23 ENCOUNTER — Ambulatory Visit (INDEPENDENT_AMBULATORY_CARE_PROVIDER_SITE_OTHER): Payer: 59

## 2014-04-23 DIAGNOSIS — J309 Allergic rhinitis, unspecified: Secondary | ICD-10-CM

## 2014-04-30 ENCOUNTER — Ambulatory Visit (INDEPENDENT_AMBULATORY_CARE_PROVIDER_SITE_OTHER): Payer: 59

## 2014-04-30 DIAGNOSIS — J309 Allergic rhinitis, unspecified: Secondary | ICD-10-CM

## 2014-05-01 ENCOUNTER — Ambulatory Visit (INDEPENDENT_AMBULATORY_CARE_PROVIDER_SITE_OTHER): Payer: 59 | Admitting: Adult Health

## 2014-05-01 ENCOUNTER — Telehealth: Payer: Self-pay | Admitting: *Deleted

## 2014-05-01 ENCOUNTER — Telehealth: Payer: Self-pay | Admitting: Internal Medicine

## 2014-05-01 ENCOUNTER — Encounter: Payer: Self-pay | Admitting: Adult Health

## 2014-05-01 VITALS — BP 166/106 | HR 72 | Temp 97.6°F | Ht 74.0 in | Wt 228.4 lb

## 2014-05-01 DIAGNOSIS — I10 Essential (primary) hypertension: Secondary | ICD-10-CM

## 2014-05-01 DIAGNOSIS — J301 Allergic rhinitis due to pollen: Secondary | ICD-10-CM

## 2014-05-01 MED ORDER — PREDNISONE 10 MG PO TABS: ORAL_TABLET | ORAL | Status: DC

## 2014-05-01 MED ORDER — MONTELUKAST SODIUM 10 MG PO TABS: 10.0000 mg | ORAL_TABLET | Freq: Every day | ORAL | Status: DC

## 2014-05-01 NOTE — Telephone Encounter (Signed)
Called spoke with pt. C/o sinus issues and not able to sleep at night. C/o PND, congestion in throat, sinus pressure, HA. Denies any cough, no wheezing, no chest tx. Pt took nasonex and allegra--not helping. Pt is requesting APPT.  Please advise CDY thanks  Allergies  Allergen Reactions  . Cardizem [Diltiazem Hcl]     tired  . Losartan     Felt bad, sweaty  . Meloxicam     fatigue  . Pravastatin     Heart flutter  . Verapamil     Not an allergic reaction

## 2014-05-01 NOTE — Patient Instructions (Addendum)
Begin Singulair 10 mg daily in the afternoon. Saline nasal rinses twice daily. Dymista nasal 1 puff Twice daily  Until sample is gone then resume Nasonex.  Change Allegra to Zyrtec 10mg  daily  Begin Chlorpheneramine 4mg  2 At bedtime  As needed  Drainage  Prednisone taper over next week.  Follow up Dr. Annamaria Boots  In 6 weeks and As needed   Please contact office for sooner follow up if symptoms do not improve or worsen or seek emergency care

## 2014-05-01 NOTE — Telephone Encounter (Signed)
Per CY: can pt come to see Tammy?  Called spoke with patient, appt scheduled with TP this morning at 11am.  Nothing further needed; will sign off.

## 2014-05-01 NOTE — Progress Notes (Signed)
09/14/11- 60 yoM former smoker, followed for asthma, allergic rhinitis, complicated by AFib/PAT,  LOV-09/15/10 At work he is exposed to secondhand smoke which makes him sneeze. He has done well with maintenance allergy vaccine without need to change. In the last few weeks has resumed loratadine and Sudafed, questioning mold from wet ground. No asthma and a very rare need for rescue inhaler.  06/14/12- 60 yoM former smoker, followed for asthma, allergic rhinitis, complicated by AFib/PAT, Still on allergy vaccine 1:10 GH and doing well; states still has some stuffiness at times but better since last visit. On a long bike ride one week ago, he inhaled ? an insect. He began coughing immediately and still gets some persistent cough with clear mucus. Rescue inhaler has been some help.  06/14/13- 62 yoM former smoker, followed for asthma, allergic rhinitis, complicated by AFib/PAT, Still on allergy vaccine 1:10 GH FOLLOWS FOR: still on vaccine and doing well; denies any flare ups at this time. Easier DOE riding bike. No cough/ wheeze/ CP. Myocardial perfusion 07/26/12- EF 62% Allergy vaccine doing well. No hx of asbestos exposure- unexplained pleural plaques. CT chest 06/15/12 IMPRESSION:  1. The plain film abnormalities were likely secondary to partially  calcified pleural plaques bilaterally; indicative of asbestos  related pleural disease. No pulmonary nodules.  2. Mild centrilobular emphysema, without acute superimposed  process in the chest.  3. Esophageal air fluid level suggests dysmotility or  gastroesophageal reflux.  4. Multivessel coronary artery atherosclerosis.  Original Report Authenticated By: Areta Haber, M.D.  10/31/13- 16 yoM former smoker, followed for asthma, allergic rhinitis, pleural plaques(unexplained), complicated by AFib/PAT/ CAD, ACUTE VISIT: lots of sinus drainage in am-thick. Chest congestion("feels like bronchitis"). SOB as well. Was given Amoxicllin last week and no  help. Has been using Sudafed during the day-helps open sinus area Still on allergy vaccine 1:10 GH Nasal congestion with thick white since a cold in December. Early transient headache. No fever. Recently some cough. Using saline rinse, sudafed. Claritin x 2 weeks had little effect.Day 5/7 Augmentin. He again denies any knowledge of any previous asbestos exposure. CXR 06/11/13 IMPRESSION:  No acute finding. Calcified pleural plaques.  Electronically Signed  By: Inge Rise M.D.  On: 06/14/2013 16:21   05/01/2014 Acute OV   59 yoM former smoker, followed for asthma, allergic rhinitis, pleural plaques(unexplained), complicated by AFib/PAT/ CAD Complains of  throat congestion with clear/white mucus, head congestion w/ thick clear drainage, some clear/bloody nasal drainage, PND, sinus pressure. Sore throat worse at night. Taking Allegra without much help. Mucus is mainly clear.  Since last visit - doesn't feel the Nasonex given at last ov helps. Says he has on/off symptoms for last several months , worse for last few weeks.  2 cats in home and carpet.  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  He denies any fever, or hemoptysis, orthopnea, PND, leg swelling, abdominal pain, nausea, vomiting, or rash. Reports he has previously been on Singulair in the past and felt that it did help with his allergy symptoms Previously on allergy vaccines years ago.       ROS-see HPI Constitutional:   No-   weight loss, night sweats, fevers, chills, fatigue, lassitude. HEENT:   No headaches, difficulty swallowing, tooth/dental problems, +sore throat,       No-  sneezing, itching, ear ache,   + nasal congestion, post nasal drip,  CV:  No-   chest pain, orthopnea, PND, swelling in lower extremities, anasarca,  dizziness, palpitations Resp: +  shortness of breath with exertion or at rest.              No-   productive cough,  No non-productive cough,  No- coughing up of blood.               No-   change in color of mucus.  No- wheezing.   Skin: No-   rash or lesions. GI:  No-   heartburn, indigestion, abdominal pain, nausea, GU: MS:  No-   joint pain or swelling.   Neuro-     nothing unusual Psych:  No- change in mood or affect. No depression or anxiety.  No memory loss.  OBJ General- Alert, Oriented, Affect-appropriate, Distress- none acute Skin- rash-none, lesions- none, excoriation- none. +Chronic rhinophyma appearance Lymphadenopathy- none Head- atraumatic            Eyes- Gross vision intact, PERRLA, conjunctivae clear secretions            Ears- Hearing, canals-normal            Nose- + sl nasal turbinate edema, no-Septal dev, mucus, polyps, erosion, perforation             Throat- Mallampati II , mucosa clear , drainage- none, tonsils- atrophic Neck- flexible , trachea midline, no stridor , thyroid nl, carotid no bruit Chest - symmetrical excursion , unlabored           Heart/CV- RRR- rate controlled , no murmur , no gallop  , no rub, nl s1 s2                           - JVD- none , edema- none, stasis changes- none, varices- none           Lung- clear, wheeze- none, cough- none , dullness-none, rub- none           Chest wall-  Abd- Extrem- cyanosis- none, clubbing, none, atrophy- none, strength- nl Neuro- grossly intact to observation

## 2014-05-01 NOTE — Assessment & Plan Note (Signed)
Acute on chronic rhinitis   Plan Begin Singulair 10 mg daily in the afternoon. Saline nasal rinses twice daily. Dymista nasal 1 puff Twice daily  Until sample is gone then resume Nasonex.  Change Allegra to Zyrtec 10mg  daily  Begin Chlorpheneramine 4mg  2 At bedtime  As needed  Drainage  Prednisone taper over next week.  Follow up Dr. Annamaria Boots  In 6 weeks and As needed   Please contact office for sooner follow up if symptoms do not improve or worsen or seek emergency care

## 2014-05-01 NOTE — Assessment & Plan Note (Addendum)
B/p remains elevated, discussed this with him, adviised of dangers of uncontrolled HTN  Advised him to follow up with PCP regarding his elevated b/p  Denies HA, visual/speech changes  OV referral with PCP discussed with pt.

## 2014-05-03 NOTE — Telephone Encounter (Signed)
Called and spoke with pt and he is aware of referral to PCP. Nothing further is needed.

## 2014-05-03 NOTE — Telephone Encounter (Signed)
Ov w/ TP 9.2.15 Pt had elevated BP - pt stated that he has been working with PCP on this Discussed w/ TP after visit >> please schedule follow up with PCP to discuss elevated BP.  Thanks.  Referral placed to assist pt with this process.

## 2014-05-07 ENCOUNTER — Ambulatory Visit (INDEPENDENT_AMBULATORY_CARE_PROVIDER_SITE_OTHER): Payer: 59

## 2014-05-07 ENCOUNTER — Ambulatory Visit (INDEPENDENT_AMBULATORY_CARE_PROVIDER_SITE_OTHER): Payer: 59 | Admitting: Internal Medicine

## 2014-05-07 ENCOUNTER — Encounter: Payer: Self-pay | Admitting: Internal Medicine

## 2014-05-07 VITALS — BP 152/100 | HR 90 | Temp 98.3°F | Wt 230.8 lb

## 2014-05-07 DIAGNOSIS — J309 Allergic rhinitis, unspecified: Secondary | ICD-10-CM

## 2014-05-07 DIAGNOSIS — M79661 Pain in right lower leg: Secondary | ICD-10-CM

## 2014-05-07 DIAGNOSIS — M79609 Pain in unspecified limb: Secondary | ICD-10-CM

## 2014-05-07 MED ORDER — TRAMADOL HCL 50 MG PO TABS: 50.0000 mg | ORAL_TABLET | Freq: Four times a day (QID) | ORAL | Status: DC | PRN

## 2014-05-07 NOTE — Progress Notes (Signed)
Pre visit review using our clinic review tool, if applicable. No additional management support is needed unless otherwise documented below in the visit note. 

## 2014-05-07 NOTE — Progress Notes (Signed)
   Subjective:    Patient ID: John Boone, male    DOB: [birth year], 62 y.o.   MRN: [id number]  HPI   He was playing soccer last night when he felt acute pain associated with feeling and possibly hearing a pop in the medial right calf. He applied ice and elevated the leg.  Now with any mobilization he has acute pain  described as sharp up to a level VIII.  Applying compression wrap and taking Advil 600 mg did help slightly.    Review of Systems   There was no specific injury or trigger other than running.  He has noted no change in color or temperature of the skin in this area.  He has no fever, chills, or sweats.  No cardiopulmonary symptoms present.     Objective:   Physical Exam   Pertinent positive findings include: The right calf appears smaller than the left but he has been wearing the compression wrap. There is exquisite tenderness to palpation over the medial right gastrocnemius muscle. There appears be  faint ecchymosis in this area. He has pain with compression of the right calf.The calf is not hot to touch. The Achilles tendon is intact. There is no pain over the popliteal space.   Strength, tone, deep tendon reflexes in lower extremities are normal. Pedal pulses are intact. Nail health is excellent. He has a regular rhythm without murmur or gallop. He has no increased work of breathing or abnormal breath sounds.      Assessment & Plan:  #1 acute right calf pain, probable muscle fiber tear. Clinically I can find no evidence of Achilles tendon rupture.  Plan: See orders and recommendations

## 2014-05-07 NOTE — Patient Instructions (Signed)
Use an anti-inflammatory cream such as Aspercreme or Zostrix cream twice a day to the affected area as needed. In lieu of this warm moist compresses or  hot water bottle can be used. Do not apply ice . Minimal Blood Pressure Goal= AVERAGE < 140/90;  Ideal is an AVERAGE < 135/85. This AVERAGE should be calculated from @ least 5-7 BP readings taken @ different times of day on different days of week. You should not respond to isolated BP readings , but rather the AVERAGE for that week .Please bring your  blood pressure cuff to office visits to verify that it is reliable.It  can also be checked against the blood pressure device at the pharmacy. Finger or wrist cuffs are not dependable; an arm cuff is. Acute pain will obviously increase blood pressure.

## 2014-05-14 ENCOUNTER — Ambulatory Visit (INDEPENDENT_AMBULATORY_CARE_PROVIDER_SITE_OTHER): Payer: 59

## 2014-05-14 DIAGNOSIS — J309 Allergic rhinitis, unspecified: Secondary | ICD-10-CM

## 2014-05-21 ENCOUNTER — Ambulatory Visit (INDEPENDENT_AMBULATORY_CARE_PROVIDER_SITE_OTHER): Payer: 59

## 2014-05-21 DIAGNOSIS — J309 Allergic rhinitis, unspecified: Secondary | ICD-10-CM

## 2014-05-23 ENCOUNTER — Ambulatory Visit (INDEPENDENT_AMBULATORY_CARE_PROVIDER_SITE_OTHER): Payer: 59

## 2014-05-23 ENCOUNTER — Ambulatory Visit: Payer: 59 | Admitting: Family Medicine

## 2014-05-23 DIAGNOSIS — J309 Allergic rhinitis, unspecified: Secondary | ICD-10-CM

## 2014-05-28 ENCOUNTER — Ambulatory Visit (INDEPENDENT_AMBULATORY_CARE_PROVIDER_SITE_OTHER): Payer: 59

## 2014-05-28 DIAGNOSIS — J309 Allergic rhinitis, unspecified: Secondary | ICD-10-CM

## 2014-05-31 ENCOUNTER — Other Ambulatory Visit: Payer: Self-pay | Admitting: Otolaryngology

## 2014-05-31 DIAGNOSIS — J328 Other chronic sinusitis: Secondary | ICD-10-CM

## 2014-06-04 ENCOUNTER — Ambulatory Visit (INDEPENDENT_AMBULATORY_CARE_PROVIDER_SITE_OTHER): Payer: 59

## 2014-06-04 DIAGNOSIS — J309 Allergic rhinitis, unspecified: Secondary | ICD-10-CM

## 2014-06-11 ENCOUNTER — Ambulatory Visit (INDEPENDENT_AMBULATORY_CARE_PROVIDER_SITE_OTHER): Payer: 59

## 2014-06-11 DIAGNOSIS — J309 Allergic rhinitis, unspecified: Secondary | ICD-10-CM

## 2014-06-18 ENCOUNTER — Other Ambulatory Visit: Payer: 59

## 2014-06-18 ENCOUNTER — Ambulatory Visit (INDEPENDENT_AMBULATORY_CARE_PROVIDER_SITE_OTHER): Payer: 59 | Admitting: Internal Medicine

## 2014-06-18 ENCOUNTER — Ambulatory Visit (INDEPENDENT_AMBULATORY_CARE_PROVIDER_SITE_OTHER): Payer: 59

## 2014-06-18 ENCOUNTER — Encounter: Payer: Self-pay | Admitting: Internal Medicine

## 2014-06-18 VITALS — BP 150/90 | HR 76 | Ht 74.0 in | Wt 233.2 lb

## 2014-06-18 DIAGNOSIS — J302 Other seasonal allergic rhinitis: Secondary | ICD-10-CM

## 2014-06-18 DIAGNOSIS — J309 Allergic rhinitis, unspecified: Secondary | ICD-10-CM

## 2014-06-18 DIAGNOSIS — J3089 Other allergic rhinitis: Principal | ICD-10-CM

## 2014-06-18 NOTE — Progress Notes (Signed)
09/14/11- 60 yoM former smoker, followed for asthma, allergic rhinitis, complicated by AFib/PAT,  LOV-09/15/10 At work he is exposed to secondhand smoke which makes him sneeze. He has done well with maintenance allergy vaccine without need to change. In the last few weeks has resumed loratadine and Sudafed, questioning mold from wet ground. No asthma and a very rare need for rescue inhaler.  06/14/12- 60 yoM former smoker, followed for asthma, allergic rhinitis, complicated by AFib/PAT, Still on allergy vaccine 1:10 GH and doing well; states still has some stuffiness at times but better since last visit. On a long bike ride one week ago, he inhaled ? an insect. He began coughing immediately and still gets some persistent cough with clear mucus. Rescue inhaler has been some help.  06/14/13- 62 yoM former smoker, followed for asthma, allergic rhinitis, complicated by AFib/PAT, Still on allergy vaccine 1:10 GH FOLLOWS FOR: still on vaccine and doing well; denies any flare ups at this time. Easier DOE riding bike. No cough/ wheeze/ CP. Myocardial perfusion 07/26/12- EF 62% Allergy vaccine doing well. No hx of asbestos exposure- unexplained pleural plaques. CT chest 06/15/12 IMPRESSION:  1. The plain film abnormalities were likely secondary to partially  calcified pleural plaques bilaterally; indicative of asbestos  related pleural disease. No pulmonary nodules.  2. Mild centrilobular emphysema, without acute superimposed  process in the chest.  3. Esophageal air fluid level suggests dysmotility or  gastroesophageal reflux.  4. Multivessel coronary artery atherosclerosis.  Original Report Authenticated By: Areta Haber, M.D.  10/31/13- 25 yoM former smoker, followed for asthma, allergic rhinitis, pleural plaques(unexplained), complicated by AFib/PAT/ CAD, ACUTE VISIT: lots of sinus drainage in am-thick. Chest congestion("feels like bronchitis"). SOB as well. Was given Amoxicllin last week and no  help. Has been using Sudafed during the day-helps open sinus area Still on allergy vaccine 1:10 GH Nasal congestion with thick white since a cold in December. Early transient headache. No fever. Recently some cough. Using saline rinse, sudafed. Claritin x 2 weeks had little effect.Day 5/7 Augmentin. He again denies any knowledge of any previous asbestos exposure. CXR 06/11/13 IMPRESSION:  No acute finding. Calcified pleural plaques.  Electronically Signed  By: Inge Rise M.D.  On: 06/14/2013 16:21   05/01/2014 Acute OV   25 yoM former smoker, followed for asthma, allergic rhinitis, pleural plaques(unexplained), complicated by AFib/PAT/ CAD Complains of  throat congestion with clear/white mucus, head congestion w/ thick clear drainage, some clear/bloody nasal drainage, PND, sinus pressure. Sore throat worse at night. Taking Allegra without much help. Mucus is mainly clear.  Since last visit - doesn't feel the Nasonex given at last ov helps. Says he has on/off symptoms for last several months , worse for last few weeks.  2 cats in home and carpet.  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  He denies any fever, or hemoptysis, orthopnea, PND, leg swelling, abdominal pain, nausea, vomiting, or rash. Reports he has previously been on Singulair in the past and felt that it did help with his allergy symptoms Previously on allergy vaccine years ago.   06/18/14- 44 yoM former smoker/ 30 pk yrs, followed for asthma, allergic rhinitis, pleural plaques(unexplained), complicated by AFib/PAT/ CAD, FOLLOWS FOR: Allergies patient having throat drainage more at night, nasal drainage is better from last visit. Sinus pressure has alleviated,patient back on Nasonex.  CXR 11/12/13 FINDINGS:  The heart size and mediastinal contours are within normal limits.  Stable bilateral calcified pleural plaques compared to prior exam.  No  pneumothorax or pleural effusion is seen. No acute  pulmonary  disease is noted. The visualized skeletal structures are  unremarkable.  IMPRESSION:  No acute cardiopulmonary abnormality seen.  Electronically Signed  By: Sabino Dick M.D.  On: 11/12/2013 18:06   ROS-see HPI Constitutional:   No-   weight loss, night sweats, fevers, chills, fatigue, lassitude. HEENT:   No headaches, difficulty swallowing, tooth/dental problems, +sore throat,       No-  sneezing, itching, ear ache,   + nasal congestion, post nasal drip,  CV:  No-   chest pain, orthopnea, PND, swelling in lower extremities, anasarca,  dizziness, palpitations Resp: +shortness of breath with exertion or at rest.              No-   productive cough,  No non-productive cough,  No- coughing up of blood.              No-   change in color of mucus.  No- wheezing.   Skin: No-   rash or lesions. GI:  No-   heartburn, indigestion, abdominal pain, nausea, GU: MS:  No-   joint pain or swelling.   Neuro-     nothing unusual Psych:  No- change in mood or affect. No depression or anxiety.  No memory loss.  OBJ General- Alert, Oriented, Affect-appropriate, Distress- none acute Skin- rash-none, lesions- none, excoriation- none. +Chronic rhinophyma appearance Lymphadenopathy- none Head- atraumatic            Eyes- Gross vision intact, PERRLA, conjunctivae clear secretions            Ears- Hearing, canals-normal            Nose- + sl nasal turbinate edema, no-Septal dev, mucus, polyps, erosion, perforation             Throat- Mallampati II , mucosa clear , drainage- none, tonsils- atrophic Neck- flexible , trachea midline, no stridor , thyroid nl, carotid no bruit Chest - symmetrical excursion , unlabored           Heart/CV- RRR- rate controlled , no murmur , no gallop  , no rub, nl s1 s2                           - JVD- none , edema- none, stasis changes- none, varices- none           Lung- clear, wheeze- none, cough- none , dullness-none, rub- none           Chest wall-   Abd- Extrem- cyanosis- none, clubbing, none, atrophy- none, strength- nl Neuro- grossly intact to observation

## 2014-06-18 NOTE — Patient Instructions (Signed)
Order- lab- Allergy profile    Dx seasonal and perennial allergic rhinitis  Try otc nasal spray Nasalcrom/ cromol/ cromolyn  This is an allergy medication different from antihistamines and steroids like Nasonex. You can continue Nasonex, just not at the same time.  Consider trying otc nasal rinse product "Alkalol" in your nasal rinse bottle  We can continue allergy vaccine 1:10 GH

## 2014-06-19 LAB — ALLERGY FULL PROFILE
Allergen, D pternoyssinus,d7: 0.37 kU/L — ABNORMAL HIGH
Alternaria Alternata: <0.1 kU/L
Aspergillus fumigatus, m3: <0.1 kU/L
Bermuda Grass: <0.1 kU/L
Cat Dander: <0.1 kU/L
Common Ragweed: <0.1 kU/L
D. farinae: 0.48 kU/L — ABNORMAL HIGH
Elm IgE: <0.1 kU/L
G005 Rye, Perennial: <0.1 kU/L
G009 Red Top: <0.1 kU/L
Helminthosporium halodes: <0.1 kU/L
House Dust Hollister: <0.1 kU/L
IGE (IMMUNOGLOBULIN E), SERUM: 106 kU/L (ref <115)
Lamb's Quarters: <0.1 kU/L
Oak: <0.1 kU/L
Plantain: <0.1 kU/L
Sycamore Tree: <0.1 kU/L
Timothy Grass: <0.1 kU/L

## 2014-06-20 ENCOUNTER — Telehealth: Payer: Self-pay | Admitting: Internal Medicine

## 2014-06-20 NOTE — Telephone Encounter (Signed)
lmtcb X1 to relay results. 

## 2014-06-20 NOTE — Telephone Encounter (Signed)
Result Note     The allergy profile showed elevation now for dust mites. Would emphasize dust control measures, keeping the home free of clutter so it is easy to keep clean. Can put dust mite encasings on mattress and pillows. Continue current treatments. We will watch for causes of nose problems other than allergy.

## 2014-06-21 NOTE — Telephone Encounter (Signed)
Spoke with pt and notified of results per Dr. Young Pt verbalized understanding and denied any questions. 

## 2014-06-21 NOTE — Telephone Encounter (Signed)
[phone] returning a call

## 2014-06-25 ENCOUNTER — Ambulatory Visit (INDEPENDENT_AMBULATORY_CARE_PROVIDER_SITE_OTHER): Payer: 59

## 2014-06-25 DIAGNOSIS — J309 Allergic rhinitis, unspecified: Secondary | ICD-10-CM

## 2014-06-28 NOTE — Progress Notes (Signed)
Quick Note:  LMTCB ______ 

## 2014-07-02 ENCOUNTER — Ambulatory Visit (INDEPENDENT_AMBULATORY_CARE_PROVIDER_SITE_OTHER): Payer: 59

## 2014-07-02 DIAGNOSIS — J309 Allergic rhinitis, unspecified: Secondary | ICD-10-CM

## 2014-07-03 ENCOUNTER — Encounter: Payer: Self-pay | Admitting: Internal Medicine

## 2014-07-04 ENCOUNTER — Telehealth: Payer: Self-pay | Admitting: Internal Medicine

## 2014-07-04 NOTE — Telephone Encounter (Signed)
Notes Recorded by Doroteo Glassman, RN on 07/04/2014 at 1:24 PM [facility] x 1 Notes Recorded by Maurice March, RN on 06/28/2014 at 3:49 PM LMTCB Notes Recorded by Glean Hess, CMA on 06/19/2014 at 4:13 PM LMTCB. 06/19/2014 Notes Recorded by Deneise Lever, MD on 06/19/2014 at 12:58 PM The allergy profile showed elevation now for dust mites. Would emphasize dust control measures, keeping the home free of clutter so it is easy to keep clean. Can put dust mite encasings on mattress and pillows. Continue current treatments. We will watch for causes of nose problems other than allergy.  Pt aware of results. Nothing more needed at this time.

## 2014-07-09 ENCOUNTER — Ambulatory Visit (INDEPENDENT_AMBULATORY_CARE_PROVIDER_SITE_OTHER): Payer: 59

## 2014-07-09 DIAGNOSIS — J309 Allergic rhinitis, unspecified: Secondary | ICD-10-CM

## 2014-07-16 ENCOUNTER — Ambulatory Visit (INDEPENDENT_AMBULATORY_CARE_PROVIDER_SITE_OTHER): Payer: 59

## 2014-07-16 DIAGNOSIS — J309 Allergic rhinitis, unspecified: Secondary | ICD-10-CM

## 2014-07-19 ENCOUNTER — Other Ambulatory Visit: Payer: Self-pay | Admitting: Internal Medicine

## 2014-07-22 ENCOUNTER — Encounter: Payer: Self-pay | Admitting: Internal Medicine

## 2014-07-23 ENCOUNTER — Ambulatory Visit (INDEPENDENT_AMBULATORY_CARE_PROVIDER_SITE_OTHER): Payer: 59

## 2014-07-23 DIAGNOSIS — J309 Allergic rhinitis, unspecified: Secondary | ICD-10-CM

## 2014-07-30 ENCOUNTER — Ambulatory Visit (INDEPENDENT_AMBULATORY_CARE_PROVIDER_SITE_OTHER): Payer: 59

## 2014-07-30 DIAGNOSIS — J309 Allergic rhinitis, unspecified: Secondary | ICD-10-CM

## 2014-07-31 ENCOUNTER — Encounter: Payer: Self-pay | Admitting: Internal Medicine

## 2014-07-31 ENCOUNTER — Ambulatory Visit (INDEPENDENT_AMBULATORY_CARE_PROVIDER_SITE_OTHER)
Admission: RE | Admit: 2014-07-31 | Discharge: 2014-07-31 | Disposition: A | Discharge status: Home / Self Care | Payer: 59 | Source: Ambulatory Visit | Attending: Internal Medicine | Admitting: Internal Medicine

## 2014-07-31 ENCOUNTER — Ambulatory Visit (INDEPENDENT_AMBULATORY_CARE_PROVIDER_SITE_OTHER): Payer: 59 | Admitting: Internal Medicine

## 2014-07-31 ENCOUNTER — Ambulatory Visit: Payer: 59

## 2014-07-31 VITALS — BP 134/82 | HR 67 | Temp 97.3°F | Resp 16 | Ht 74.0 in | Wt 216.0 lb

## 2014-07-31 DIAGNOSIS — R059 Cough, unspecified: Secondary | ICD-10-CM

## 2014-07-31 DIAGNOSIS — J208 Acute bronchitis due to other specified organisms: Secondary | ICD-10-CM

## 2014-07-31 DIAGNOSIS — R05 Cough: Secondary | ICD-10-CM

## 2014-07-31 MED ORDER — HYDROCOD POLST-CPM POLST ER 10-8 MG PO CP12: 1.0000 | ORAL_CAPSULE | Freq: Two times a day (BID) | ORAL | Status: DC | PRN

## 2014-07-31 NOTE — Assessment & Plan Note (Signed)
I will check his CXR for mass, PNA, edema

## 2014-07-31 NOTE — Assessment & Plan Note (Signed)
I think this is viral Will control the cough with tussicaps No additional antibiotics are needed

## 2014-07-31 NOTE — Progress Notes (Signed)
Pre visit review using our clinic review tool, if applicable. No additional management support is needed unless otherwise documented below in the visit note. 

## 2014-07-31 NOTE — Patient Instructions (Signed)
Cough, Adult  A cough is a reflex that helps clear your throat and airways. It can help heal the body or may be a reaction to an irritated airway. A cough may only last 2 or 3 weeks (acute) or may last more than 8 weeks (chronic).  CAUSES Acute cough:  Viral or bacterial infections. Chronic cough:  Infections.  Allergies.  Asthma.  Post-nasal drip.  Smoking.  Heartburn or acid reflux.  Some medicines.  Chronic lung problems (COPD).  Cancer. SYMPTOMS   Cough.  Fever.  Chest pain.  Increased breathing rate.  High-pitched whistling sound when breathing (wheezing).  Colored mucus that you cough up (sputum). TREATMENT   A bacterial cough may be treated with antibiotic medicine.  A viral cough must run its course and will not respond to antibiotics.  Your caregiver may recommend other treatments if you have a chronic cough. HOME CARE INSTRUCTIONS   Only take over-the-counter or prescription medicines for pain, discomfort, or fever as directed by your caregiver. Use cough suppressants only as directed by your caregiver.  Use a cold steam vaporizer or humidifier in your bedroom or home to help loosen secretions.  Sleep in a semi-upright position if your cough is worse at night.  Rest as needed.  Stop smoking if you smoke. SEEK IMMEDIATE MEDICAL CARE IF:   You have pus in your sputum.  Your cough starts to worsen.  You cannot control your cough with suppressants and are losing sleep.  You begin coughing up blood.  You have difficulty breathing.  You develop pain which is getting worse or is uncontrolled with medicine.  You have a fever. MAKE SURE YOU:   Understand these instructions.  Will watch your condition.  Will get help right away if you are not doing well or get worse. Document Released: 02/12/2011 Document Revised: 11/08/2011 Document Reviewed: 02/12/2011 Exit[employer] Patient Information 2015 [employer]. This information is not intended  to replace advice given to you by your health care provider. Make sure you discuss any questions you have with your health care provider.  

## 2014-07-31 NOTE — Progress Notes (Signed)
   Subjective:    Patient ID: John Boone, male    DOB: 11-Jun-1951, 63 y.o.   MRN: [id number]  Cough This is a new problem. The current episode started in the past 7 days. The problem has been unchanged. The problem occurs every few hours. The cough is productive of sputum (clear to white sputum). Associated symptoms include chills. Pertinent negatives include no chest pain, ear congestion, ear pain, fever, headaches, heartburn, hemoptysis, myalgias, nasal congestion, postnasal drip, rash, rhinorrhea, sore throat, shortness of breath, sweats, weight loss or wheezing. He has tried OTC cough suppressant (doxycycline) for the symptoms. The treatment provided mild relief. His past medical history is significant for asthma. There is no history of bronchiectasis, bronchitis, COPD, emphysema, environmental allergies or pneumonia.      Review of Systems  Constitutional: Positive for chills. Negative for fever, weight loss, diaphoresis, activity change, appetite change and fatigue.  HENT: Negative.  Negative for ear pain, postnasal drip, rhinorrhea, sore throat and trouble swallowing.   Eyes: Negative.   Respiratory: Positive for cough. Negative for apnea, hemoptysis, choking, chest tightness, shortness of breath and wheezing.   Cardiovascular: Negative.  Negative for chest pain, palpitations and leg swelling.  Gastrointestinal: Negative.  Negative for heartburn and abdominal pain.  Endocrine: Negative.   Genitourinary: Negative.   Musculoskeletal: Negative.  Negative for myalgias.  Skin: Negative.  Negative for rash.  Allergic/Immunologic: Negative.  Negative for environmental allergies.  Neurological: Negative.  Negative for headaches.  Hematological: Negative.   Psychiatric/Behavioral: Negative.        Objective:   Physical Exam  Constitutional: He is oriented to person, place, and time. He appears well-developed and well-nourished. No distress.  HENT:  Head: Normocephalic and atraumatic.    Mouth/Throat: Oropharynx is clear and moist. No oropharyngeal exudate.  Eyes: Conjunctivae are normal. Right eye exhibits no discharge. Left eye exhibits no discharge. No scleral icterus.  Neck: Normal range of motion. Neck supple. No JVD present. No tracheal deviation present. No thyromegaly present.  Cardiovascular: Normal rate, regular rhythm, normal heart sounds and intact distal pulses.  Exam reveals no gallop and no friction rub.   No murmur heard. Pulmonary/Chest: Effort normal and breath sounds normal. No stridor. No respiratory distress. He has no wheezes. He has no rales. He exhibits no tenderness.  Abdominal: Soft. Bowel sounds are normal. He exhibits no distension and no mass. There is no tenderness. There is no rebound and no guarding.  Musculoskeletal: Normal range of motion. He exhibits no edema or tenderness.  Lymphadenopathy:    He has no cervical adenopathy.  Neurological: He is oriented to person, place, and time.  Skin: Skin is warm and dry. No rash noted. He is not diaphoretic. No erythema. No pallor.  Psychiatric: He has a normal mood and affect. His behavior is normal. Judgment and thought content normal.  Vitals reviewed.         Assessment & Plan:

## 2014-08-06 ENCOUNTER — Ambulatory Visit (INDEPENDENT_AMBULATORY_CARE_PROVIDER_SITE_OTHER): Payer: 59

## 2014-08-06 DIAGNOSIS — J309 Allergic rhinitis, unspecified: Secondary | ICD-10-CM

## 2014-08-08 ENCOUNTER — Telehealth: Payer: Self-pay | Admitting: *Deleted

## 2014-08-08 MED ORDER — AMOXICILLIN-POT CLAVULANATE 875-125 MG PO TABS: 1.0000 | ORAL_TABLET | Freq: Two times a day (BID) | ORAL | Status: DC

## 2014-08-08 NOTE — Telephone Encounter (Signed)
Left msg on triage stating he is pretty sure he has a sinus infection. Saw Dr. Ronnald Ramp on 12/2 for cough sxs, now he is having sinus issues. Wanting md to rx something for sinus infection...John Boone

## 2014-08-08 NOTE — Telephone Encounter (Signed)
OK Augmentin - emailed. Hold doxycycline x 2 weeks Thx

## 2014-08-08 NOTE — Telephone Encounter (Signed)
Notified pt with md response.../lmb 

## 2014-08-13 ENCOUNTER — Ambulatory Visit (INDEPENDENT_AMBULATORY_CARE_PROVIDER_SITE_OTHER): Payer: 59

## 2014-08-13 DIAGNOSIS — J309 Allergic rhinitis, unspecified: Secondary | ICD-10-CM

## 2014-08-20 ENCOUNTER — Ambulatory Visit (INDEPENDENT_AMBULATORY_CARE_PROVIDER_SITE_OTHER): Payer: 59

## 2014-08-20 DIAGNOSIS — J309 Allergic rhinitis, unspecified: Secondary | ICD-10-CM

## 2014-08-22 ENCOUNTER — Ambulatory Visit (INDEPENDENT_AMBULATORY_CARE_PROVIDER_SITE_OTHER): Payer: 59 | Admitting: Family

## 2014-08-22 ENCOUNTER — Encounter: Payer: Self-pay | Admitting: Family

## 2014-08-22 ENCOUNTER — Telehealth: Payer: Self-pay | Admitting: Internal Medicine

## 2014-08-22 VITALS — BP 180/120 | HR 72 | Temp 97.9°F | Resp 18 | Ht 74.0 in | Wt 228.6 lb

## 2014-08-22 DIAGNOSIS — J019 Acute sinusitis, unspecified: Secondary | ICD-10-CM

## 2014-08-22 MED ORDER — LEVOFLOXACIN 500 MG PO TABS: 500.0000 mg | ORAL_TABLET | Freq: Every day | ORAL | Status: DC

## 2014-08-22 NOTE — Assessment & Plan Note (Signed)
Symptoms and exam consistent with refractory sinus infection. Start Levaquin 7 days. Continue over-the-counter medications as needed for symptom relief and supportive care. Discussed proper sinus care. Follow up if symptoms worsen or fail to improve.

## 2014-08-22 NOTE — Progress Notes (Signed)
Subjective:    Patient ID: John Boone, male    DOB: 04-Oct-1950, 63 y.o.   MRN: [id number]  Chief Complaint  Patient presents with  . Sinus issues    Sinus drainage really thick, congestion, cough, since thanksgiving    HPI:  John Boone is a 63 y.o. male who presents today for acute visit.   Acute symptoms of sinus symptoms of congestion and cough has been going on since Thanksgiving. He was previously seen in the office and diagnosed with acute bronchitis and then began to have symptoms of a sinus infection for which she was prescribed Augmentin. Upon completion of the Augmentin he felt improvement, however he still continues to have thick mucus, coughing, sinus pressure, and congestion. Denies any fevers, chills, body aches, nausea, vomiting, or diarrhea. He has tried over-the-counter Mucinex, Sudafed and Nasonex. Off is exacerbated by lying flat for any period of time.   Allergies  Allergen Reactions  . Cardizem [Diltiazem Hcl]     tired  . Losartan     Felt bad, sweaty  . Meloxicam     fatigue  . Pravastatin     Heart flutter  . Verapamil     Not an allergic reaction    Current Outpatient Prescriptions on File Prior to Visit  Medication Sig Dispense Refill  . albuterol (PROVENTIL HFA) 108 (90 BASE) MCG/ACT inhaler Inhale 2 puffs into the lungs 4 (four) times daily as needed for shortness of breath. 1 Inhaler 6  . amoxicillin-clavulanate (AUGMENTIN) 875-125 MG per tablet Take 1 tablet by mouth 2 (two) times daily. 20 tablet 0  . aspirin 325 MG tablet Take 325 mg by mouth daily.      . Cholecalciferol 1000 UNITS tablet Take 1,000 Units by mouth daily.      Marland Kitchen doxycycline (VIBRA-TABS) 100 MG tablet TAKE ONE CAPSULE BY MOUTH TWICE A DAY 60 tablet 5  . metoprolol succinate (TOPROL-XL) 25 MG 24 hr tablet Take 1 tablet (25 mg total) by mouth 2 (two) times daily. 60 tablet 11  . mometasone (NASONEX) 50 MCG/ACT nasal spray Place 2 sprays into the nose daily. 17 g 11  .  montelukast (SINGULAIR) 10 MG tablet Take 1 tablet (10 mg total) by mouth at bedtime. 30 tablet 11  . NON FORMULARY Inject into the skin. Allergy Shots 1:10 GH      No current facility-administered medications on file prior to visit.    Review of Systems    See HPI  Objective:    BP 180/120 mmHg  Pulse 72  Temp(Src) 97.9 F (36.6 C) (Oral)  Resp 18  Ht 6\' 2"  (1.88 m)  Wt 228 lb 9.6 oz (103.692 kg)  BMI 29.34 kg/m2  SpO2 97% Nursing note and vital signs reviewed.  Physical Exam  Constitutional: He is oriented to person, place, and time. He appears well-developed and well-nourished. No distress.  HENT:  Right Ear: Hearing, tympanic membrane, external ear and ear canal normal.  Left Ear: Hearing, external ear and ear canal normal.  Nose: Right sinus exhibits frontal sinus tenderness. Right sinus exhibits no maxillary sinus tenderness. Left sinus exhibits frontal sinus tenderness. Left sinus exhibits no maxillary sinus tenderness.  Mouth/Throat: Uvula is midline, oropharynx is clear and moist and mucous membranes are normal.  Impacted cerumen noted left ear.  Cardiovascular: Normal rate, regular rhythm, normal heart sounds and intact distal pulses.   Pulmonary/Chest: Effort normal and breath sounds normal.  Lymphadenopathy:    He has no cervical  adenopathy.  Neurological: He is alert and oriented to person, place, and time.  Skin: Skin is warm and dry.  Psychiatric: He has a normal mood and affect. His behavior is normal. Judgment and thought content normal.       Assessment & Plan:

## 2014-08-22 NOTE — Telephone Encounter (Signed)
Patient states that his amoxicillin-clavulanate (AUGMENTIN) 875-125 MG per tablet was working, but once it ran out, he began to digress to previous symptoms. Patient asking if we can prescribe another round of meds?

## 2014-08-22 NOTE — Progress Notes (Signed)
Pre visit review using our clinic review tool, if applicable. No additional management support is needed unless otherwise documented below in the visit note. 

## 2014-08-22 NOTE — Patient Instructions (Signed)
Thank you for choosing [facility].  Summary/Instructions:  Your prescription(s) have been submitted to your pharmacy. Please take as directed and contact our office if you believe you are having problem(s) with the medication(s).  If your symptoms worsen or fail to improve, please contact our office for further instruction, or in case of emergency go directly to the emergency room at the closest medical facility.   General Recommendations: Please drink plenty of fluids. Sleep in humidified air if possible. Use saline nasal sprays or the Netti pot as needed.   Medicaitons:  Flonase as needed to assist with congestion. You may use Mucinex to help break up congestion as you feel is needed. Tylenol as needed for fever. Coricidin HBP for congestion.   Sinusitis Sinusitis is redness, soreness, and inflammation of the paranasal sinuses. Paranasal sinuses are air pockets within the bones of your face (beneath the eyes, the middle of the forehead, or above the eyes). In healthy paranasal sinuses, mucus is able to drain out, and air is able to circulate through them by way of your nose. However, when your paranasal sinuses are inflamed, mucus and air can become trapped. This can allow bacteria and other germs to grow and cause infection. Sinusitis can develop quickly and last only a short time (acute) or continue over a long period (chronic). Sinusitis that lasts for more than 12 weeks is considered chronic.  CAUSES  Causes of sinusitis include:  Allergies.  Structural abnormalities, such as displacement of the cartilage that separates your nostrils (deviated septum), which can decrease the air flow through your nose and sinuses and affect sinus drainage.  Functional abnormalities, such as when the small hairs (cilia) that line your sinuses and help remove mucus do not work properly or are not present. SIGNS AND SYMPTOMS  Symptoms of acute and chronic sinusitis are the same. The primary  symptoms are pain and pressure around the affected sinuses. Other symptoms include:  Upper toothache.  Earache.  Headache.  Bad breath.  Decreased sense of smell and taste.  A cough, which worsens when you are lying flat.  Fatigue.  Fever.  Thick drainage from your nose, which often is green and may contain pus (purulent).  Swelling and warmth over the affected sinuses. DIAGNOSIS  Your health care provider will perform a physical exam. During the exam, your health care provider may:  Look in your nose for signs of abnormal growths in your nostrils (nasal polyps).  Tap over the affected sinus to check for signs of infection.  View the inside of your sinuses (endoscopy) using an imaging device that has a light attached (endoscope). If your health care provider suspects that you have chronic sinusitis, one or more of the following tests may be recommended:  Allergy tests.  Nasal culture. A sample of mucus is taken from your nose, sent to a lab, and screened for bacteria.  Nasal cytology. A sample of mucus is taken from your nose and examined by your health care provider to determine if your sinusitis is related to an allergy. TREATMENT  Most cases of acute sinusitis are related to a viral infection and will resolve on their own within 10 days. Sometimes medicines are prescribed to help relieve symptoms (pain medicine, decongestants, nasal steroid sprays, or saline sprays).  However, for sinusitis related to a bacterial infection, your health care provider will prescribe antibiotic medicines. These are medicines that will help kill the bacteria causing the infection.  Rarely, sinusitis is caused by a fungal infection.  In theses cases, your health care provider will prescribe antifungal medicine. For some cases of chronic sinusitis, surgery is needed. Generally, these are cases in which sinusitis recurs more than 3 times per year, despite other treatments. HOME CARE INSTRUCTIONS     Drink plenty of water. Water helps thin the mucus so your sinuses can drain more easily.  Use a humidifier.  Inhale steam 3 to 4 times a day (for example, sit in the bathroom with the shower running).  Apply a warm, moist washcloth to your face 3 to 4 times a day, or as directed by your health care provider.  Use saline nasal sprays to help moisten and clean your sinuses.  Take medicines only as directed by your health care provider.  If you were prescribed either an antibiotic or antifungal medicine, finish it all even if you start to feel better. SEEK IMMEDIATE MEDICAL CARE IF:  You have increasing pain or severe headaches.  You have nausea, vomiting, or drowsiness.  You have swelling around your face.  You have vision problems.  You have a stiff neck.  You have difficulty breathing. MAKE SURE YOU:   Understand these instructions.  Will watch your condition.  Will get help right away if you are not doing well or get worse. Document Released: 08/16/2005 Document Revised: 12/31/2013 Document Reviewed: 08/31/2011 [facility] Patient Information 2015 [place], [place]. This information is not intended to replace advice given to you by your health care provider. Make sure you discuss any questions you have with your health care provider.

## 2014-08-22 NOTE — Telephone Encounter (Signed)
After speaking to nurse, called patient back to schedule appt.

## 2014-08-27 ENCOUNTER — Ambulatory Visit (INDEPENDENT_AMBULATORY_CARE_PROVIDER_SITE_OTHER): Payer: 59

## 2014-08-27 DIAGNOSIS — J309 Allergic rhinitis, unspecified: Secondary | ICD-10-CM

## 2014-09-03 ENCOUNTER — Ambulatory Visit (INDEPENDENT_AMBULATORY_CARE_PROVIDER_SITE_OTHER): Payer: 59

## 2014-09-03 DIAGNOSIS — J309 Allergic rhinitis, unspecified: Secondary | ICD-10-CM

## 2014-09-10 ENCOUNTER — Ambulatory Visit (INDEPENDENT_AMBULATORY_CARE_PROVIDER_SITE_OTHER): Payer: 59

## 2014-09-10 DIAGNOSIS — J309 Allergic rhinitis, unspecified: Secondary | ICD-10-CM

## 2014-09-13 ENCOUNTER — Ambulatory Visit (INDEPENDENT_AMBULATORY_CARE_PROVIDER_SITE_OTHER): Payer: 59

## 2014-09-13 DIAGNOSIS — J309 Allergic rhinitis, unspecified: Secondary | ICD-10-CM

## 2014-09-17 ENCOUNTER — Ambulatory Visit (INDEPENDENT_AMBULATORY_CARE_PROVIDER_SITE_OTHER): Payer: 59

## 2014-09-17 DIAGNOSIS — J309 Allergic rhinitis, unspecified: Secondary | ICD-10-CM

## 2014-09-18 ENCOUNTER — Other Ambulatory Visit: Payer: 59

## 2014-09-24 ENCOUNTER — Ambulatory Visit (INDEPENDENT_AMBULATORY_CARE_PROVIDER_SITE_OTHER): Payer: 59

## 2014-09-24 DIAGNOSIS — J309 Allergic rhinitis, unspecified: Secondary | ICD-10-CM

## 2014-09-25 ENCOUNTER — Other Ambulatory Visit (INDEPENDENT_AMBULATORY_CARE_PROVIDER_SITE_OTHER): Payer: 59

## 2014-09-25 ENCOUNTER — Encounter: Payer: Self-pay | Admitting: Internal Medicine

## 2014-09-25 ENCOUNTER — Ambulatory Visit (INDEPENDENT_AMBULATORY_CARE_PROVIDER_SITE_OTHER): Payer: 59 | Admitting: Internal Medicine

## 2014-09-25 VITALS — BP 180/110 | HR 80 | Temp 98.2°F | Ht 74.0 in | Wt 227.0 lb

## 2014-09-25 DIAGNOSIS — Z0000 Encounter for general adult medical examination without abnormal findings: Secondary | ICD-10-CM

## 2014-09-25 DIAGNOSIS — J320 Chronic maxillary sinusitis: Secondary | ICD-10-CM

## 2014-09-25 DIAGNOSIS — I471 Supraventricular tachycardia: Secondary | ICD-10-CM

## 2014-09-25 DIAGNOSIS — I2510 Atherosclerotic heart disease of native coronary artery without angina pectoris: Secondary | ICD-10-CM

## 2014-09-25 DIAGNOSIS — J329 Chronic sinusitis, unspecified: Secondary | ICD-10-CM | POA: Insufficient documentation

## 2014-09-25 DIAGNOSIS — I2583 Coronary atherosclerosis due to lipid rich plaque: Secondary | ICD-10-CM

## 2014-09-25 LAB — URINALYSIS
Bilirubin Urine: NEGATIVE
Hgb urine dipstick: NEGATIVE
KETONES UR: NEGATIVE
Leukocytes, UA: NEGATIVE
NITRITE: NEGATIVE
PH: 6 (ref 5.0–8.0)
Total Protein, Urine: NEGATIVE
Urine Glucose: NEGATIVE
Urobilinogen, UA: 0.2 (ref 0.0–1.0)

## 2014-09-25 LAB — CBC WITH DIFFERENTIAL/PLATELET
BASOS ABS: 0 10*3/uL (ref 0.0–0.1)
BASOS PCT: 0.6 % (ref 0.0–3.0)
Eosinophils Absolute: 0.4 10*3/uL (ref 0.0–0.7)
Eosinophils Relative: 5.6 % — ABNORMAL HIGH (ref 0.0–5.0)
HEMATOCRIT: 46.3 % (ref 39.0–52.0)
HEMOGLOBIN: 15.8 g/dL (ref 13.0–17.0)
Lymphocytes Relative: 28.2 % (ref 12.0–46.0)
Lymphs Abs: 1.9 10*3/uL (ref 0.7–4.0)
MCHC: 34 g/dL (ref 30.0–36.0)
MCV: 87.8 fl (ref 78.0–100.0)
MONO ABS: 0.6 10*3/uL (ref 0.1–1.0)
Monocytes Relative: 8.8 % (ref 3.0–12.0)
NEUTROS PCT: 56.8 % (ref 43.0–77.0)
Neutro Abs: 3.8 10*3/uL (ref 1.4–7.7)
Platelets: 235 10*3/uL (ref 150.0–400.0)
RBC: 5.27 Mil/uL (ref 4.22–5.81)
RDW: 13.9 % (ref 11.5–15.5)
WBC: 6.6 10*3/uL (ref 4.0–10.5)

## 2014-09-25 LAB — BASIC METABOLIC PANEL
BUN: 19 mg/dL (ref 6–23)
CO2: 30 meq/L (ref 19–32)
Calcium: 9.6 mg/dL (ref 8.4–10.5)
Chloride: 105 mEq/L (ref 96–112)
Creatinine, Ser: 1.03 mg/dL (ref 0.40–1.50)
GFR: 77.68 mL/min (ref >60.00)
GLUCOSE: 87 mg/dL (ref 70–99)
Potassium: 4.5 mEq/L (ref 3.5–5.1)
Sodium: 141 mEq/L (ref 135–145)

## 2014-09-25 LAB — HEPATIC FUNCTION PANEL
ALBUMIN: 4.2 g/dL (ref 3.5–5.2)
ALT: 20 U/L (ref 0–53)
AST: 17 U/L (ref 0–37)
Alkaline Phosphatase: 65 U/L (ref 39–117)
BILIRUBIN DIRECT: 0.1 mg/dL (ref 0.0–0.3)
Total Bilirubin: 0.4 mg/dL (ref 0.2–1.2)
Total Protein: 6.7 g/dL (ref 6.0–8.3)

## 2014-09-25 LAB — LIPID PANEL
CHOLESTEROL: 208 mg/dL — AB (ref 0–200)
HDL: 52.1 mg/dL (ref >39.00)
LDL Cholesterol: 126 mg/dL — ABNORMAL HIGH (ref 0–99)
NonHDL: 155.9
Total CHOL/HDL Ratio: 4
Triglycerides: 150 mg/dL — ABNORMAL HIGH (ref 0.0–149.0)
VLDL: 30 mg/dL (ref 0.0–40.0)

## 2014-09-25 LAB — PSA: PSA: 0.73 ng/mL (ref 0.10–4.00)

## 2014-09-25 LAB — TSH: TSH: 0.92 u[IU]/mL (ref 0.35–4.50)

## 2014-09-25 MED ORDER — LEVOFLOXACIN 500 MG PO TABS: 500.0000 mg | ORAL_TABLET | Freq: Every day | ORAL | Status: DC

## 2014-09-25 NOTE — Assessment & Plan Note (Signed)
Continue with current prescription therapy as reflected on the Med list.  

## 2014-09-25 NOTE — Assessment & Plan Note (Addendum)
1/16 x1 year Po abx - Levaquin x 3 wks

## 2014-09-25 NOTE — Assessment & Plan Note (Signed)
Doing well Continue with current prescription therapy as reflected on the Med list.  

## 2014-09-25 NOTE — Progress Notes (Signed)
   Subjective:     HPI  The patient is here for a wellness exam. C/o chronic sinus infection x several months.  F/u hypertension, chronic palpitations, rosacea/acne controlled with medicines. BP has been a little up at home - SBP 130-140s.  BP Readings from Last 3 Encounters:  09/25/14 180/110  08/22/14 180/120  07/31/14 134/82   Wt Readings from Last 3 Encounters:  09/25/14 227 lb (102.967 kg)  08/22/14 228 lb 9.6 oz (103.692 kg)  07/31/14 216 lb (97.977 kg)       yReview of Systems  Constitutional: Negative for appetite change, fatigue and unexpected weight change.  HENT: Negative for congestion, nosebleeds, sneezing, sore throat and trouble swallowing.   Eyes: Negative for itching and visual disturbance.  Respiratory: Negative for cough.   Cardiovascular: Negative for leg swelling.  Gastrointestinal: Negative for nausea, diarrhea, blood in stool and abdominal distention.  Genitourinary: Negative for frequency and hematuria.  Musculoskeletal: Negative for back pain, gait problem and joint swelling.  Skin: Positive for rash.  Neurological: Negative for dizziness, tremors, speech difficulty and weakness.  Psychiatric/Behavioral: Negative for sleep disturbance, dysphoric mood and agitation. The patient is not nervous/anxious.        Objective:   Physical Exam  Constitutional: He is oriented to person, place, and time. He appears well-developed.  HENT:  Mouth/Throat: Oropharynx is clear and moist.  Eyes: Conjunctivae are normal. Pupils are equal, round, and reactive to light.  Neck: Normal range of motion. No JVD present. No thyromegaly present.  Cardiovascular: Normal rate, regular rhythm, normal heart sounds and intact distal pulses.  Exam reveals no gallop and no friction rub.   No murmur heard. Pulmonary/Chest: Effort normal and breath sounds normal. No respiratory distress. He has no wheezes. He has no rales. He exhibits no tenderness.  Abdominal: Soft. Bowel  sounds are normal. He exhibits no distension and no mass. There is no tenderness. There is no rebound and no guarding.  Genitourinary: Rectum normal and prostate normal. Guaiac negative stool. No penile tenderness.  Musculoskeletal: Normal range of motion. He exhibits no edema and no tenderness.  Lymphadenopathy:    He has no cervical adenopathy.  Neurological: He is alert and oriented to person, place, and time. He has normal reflexes. No cranial nerve deficit. He exhibits normal muscle tone. Coordination normal.  Skin: Skin is warm and dry. Rash (rozacea) noted.  Psychiatric: He has a normal mood and affect. His behavior is normal. Judgment and thought content normal.  Acne Prostate 1+ Rectal nl w/G(_) stool  Lab Results  Component Value Date   WBC 6.6 09/25/2014   HGB 15.8 09/25/2014   HCT 46.3 09/25/2014   PLT 235.0 09/25/2014   GLUCOSE 87 09/25/2014   CHOL 208* 09/25/2014   TRIG 150.0* 09/25/2014   HDL 52.10 09/25/2014   LDLCALC 126* 09/25/2014   ALT 20 09/25/2014   AST 17 09/25/2014   NA 141 09/25/2014   K 4.5 09/25/2014   CL 105 09/25/2014   CREATININE 1.03 09/25/2014   BUN 19 09/25/2014   CO2 30 09/25/2014   TSH 0.92 09/25/2014   PSA 0.73 09/25/2014          Assessment & Plan:

## 2014-09-25 NOTE — Assessment & Plan Note (Signed)
We discussed age appropriate health related issues, including available/recomended screening tests and vaccinations. We discussed a need for adhering to healthy diet and exercise. Labs/EKG were reviewed/ordered. All questions were answered.   

## 2014-10-01 ENCOUNTER — Ambulatory Visit (INDEPENDENT_AMBULATORY_CARE_PROVIDER_SITE_OTHER): Payer: 59

## 2014-10-01 DIAGNOSIS — J309 Allergic rhinitis, unspecified: Secondary | ICD-10-CM

## 2014-10-07 ENCOUNTER — Telehealth: Payer: Self-pay | Admitting: *Deleted

## 2014-10-07 MED ORDER — AMOXICILLIN-POT CLAVULANATE 875-125 MG PO TABS
1.0000 | ORAL_TABLET | Freq: Two times a day (BID) | ORAL | Status: DC
Start: 2014-10-07 — End: 2014-10-28

## 2014-10-07 NOTE — Telephone Encounter (Signed)
Notified pt with md response.../lmb 

## 2014-10-07 NOTE — Telephone Encounter (Signed)
Pt states md rx levaquin since started med he has been experiencing pain & weakness in his arms & legs. Also heart palpitation. Pt states he did not take dose for today, and don't want to continue with these sxs, but he is still having sinus issue congestion...John Boone

## 2014-10-07 NOTE — Telephone Encounter (Signed)
Stop Levaquin Start Augmentin Thx

## 2014-10-08 ENCOUNTER — Encounter: Payer: Self-pay | Admitting: Internal Medicine

## 2014-10-08 ENCOUNTER — Ambulatory Visit (INDEPENDENT_AMBULATORY_CARE_PROVIDER_SITE_OTHER): Payer: 59

## 2014-10-08 DIAGNOSIS — J309 Allergic rhinitis, unspecified: Secondary | ICD-10-CM

## 2014-10-15 ENCOUNTER — Ambulatory Visit (INDEPENDENT_AMBULATORY_CARE_PROVIDER_SITE_OTHER): Payer: 59

## 2014-10-15 DIAGNOSIS — J309 Allergic rhinitis, unspecified: Secondary | ICD-10-CM

## 2014-10-22 ENCOUNTER — Ambulatory Visit (INDEPENDENT_AMBULATORY_CARE_PROVIDER_SITE_OTHER): Payer: 59

## 2014-10-22 DIAGNOSIS — J309 Allergic rhinitis, unspecified: Secondary | ICD-10-CM

## 2014-10-28 ENCOUNTER — Encounter: Payer: Self-pay | Admitting: Internal Medicine

## 2014-10-28 ENCOUNTER — Ambulatory Visit (INDEPENDENT_AMBULATORY_CARE_PROVIDER_SITE_OTHER): Payer: 59 | Admitting: Internal Medicine

## 2014-10-28 VITALS — BP 150/108 | HR 75 | Temp 98.3°F | Wt 230.8 lb

## 2014-10-28 DIAGNOSIS — J320 Chronic maxillary sinusitis: Secondary | ICD-10-CM

## 2014-10-28 DIAGNOSIS — R002 Palpitations: Secondary | ICD-10-CM

## 2014-10-28 DIAGNOSIS — I10 Essential (primary) hypertension: Secondary | ICD-10-CM

## 2014-10-28 NOTE — Assessment & Plan Note (Signed)
Continue with current Toprol therapy as reflected on the Med list.

## 2014-10-28 NOTE — Patient Instructions (Signed)
Afrin as needed °

## 2014-10-28 NOTE — Assessment & Plan Note (Signed)
2/16 - failed abx CT sinuses ENT ref

## 2014-10-28 NOTE — Progress Notes (Signed)
Pre visit review using our clinic review tool, if applicable. No additional management support is needed unless otherwise documented below in the visit note. 

## 2014-10-28 NOTE — Assessment & Plan Note (Signed)
Not too bad lately Levaquin caused a lot of palpitations

## 2014-10-28 NOTE — Progress Notes (Signed)
Subjective:     Sinusitis This is a chronic problem. The current episode started more than 1 year ago. The problem is unchanged. There has been no fever. The pain is moderate. Associated symptoms include congestion, headaches and sinus pressure. Pertinent negatives include no coughing, sneezing, sore throat or swollen glands.  Abx did not help F/u hypertension, chronic palpitations, rosacea controlled with medicines. BP has been a little up at home - SBP is never above 160... SBP 130-140s at home. He stopped Cardizem CD again - not tired now  BP Readings from Last 3 Encounters:  10/28/14 150/108  09/25/14 180/110  08/22/14 180/120   Wt Readings from Last 3 Encounters:  10/28/14 230 lb 12 oz (104.668 kg)  09/25/14 227 lb (102.967 kg)  08/22/14 228 lb 9.6 oz (103.692 kg)       yReview of Systems  Constitutional: Negative for appetite change, fatigue and unexpected weight change.  HENT: Positive for congestion and sinus pressure. Negative for nosebleeds, sneezing, sore throat and trouble swallowing.   Eyes: Negative for itching and visual disturbance.  Respiratory: Negative for cough.   Cardiovascular: Negative for leg swelling.  Gastrointestinal: Negative for nausea, diarrhea, blood in stool and abdominal distention.  Genitourinary: Negative for frequency and hematuria.  Musculoskeletal: Negative for back pain, joint swelling and gait problem.  Skin: Positive for rash.  Neurological: Positive for headaches. Negative for dizziness, tremors, speech difficulty and weakness.  Psychiatric/Behavioral: Negative for sleep disturbance, dysphoric mood and agitation. The patient is not nervous/anxious.        Objective:   Physical Exam  Constitutional: He is oriented to person, place, and time. He appears well-developed.  HENT:  Mouth/Throat: Oropharynx is clear and moist.  Eyes: Conjunctivae are normal. Pupils are equal, round, and reactive to light.  Neck: Normal range of motion.  No JVD present. No thyromegaly present.  Cardiovascular: Normal rate, regular rhythm, normal heart sounds and intact distal pulses.  Exam reveals no gallop and no friction rub.   No murmur heard. Pulmonary/Chest: Effort normal and breath sounds normal. No respiratory distress. He has no wheezes. He has no rales. He exhibits no tenderness.  Abdominal: Soft. Bowel sounds are normal. He exhibits no distension and no mass. There is no tenderness. There is no rebound and no guarding.  Genitourinary: Prostate normal. Guaiac negative stool. No penile tenderness.  Musculoskeletal: Normal range of motion. He exhibits no edema or tenderness.  Lymphadenopathy:    He has no cervical adenopathy.  Neurological: He is alert and oriented to person, place, and time. He has normal reflexes. No cranial nerve deficit. He exhibits normal muscle tone. Coordination normal.  Skin: Skin is warm and dry. Rash (rozacea) noted.  Psychiatric: He has a normal mood and affect. His behavior is normal. Judgment and thought content normal.  swollen nasal mucosa   Lab Results  Component Value Date   WBC 6.6 09/25/2014   HGB 15.8 09/25/2014   HCT 46.3 09/25/2014   PLT 235.0 09/25/2014   GLUCOSE 87 09/25/2014   CHOL 208* 09/25/2014   TRIG 150.0* 09/25/2014   HDL 52.10 09/25/2014   LDLCALC 126* 09/25/2014   ALT 20 09/25/2014   AST 17 09/25/2014   NA 141 09/25/2014   K 4.5 09/25/2014   CL 105 09/25/2014   CREATININE 1.03 09/25/2014   BUN 19 09/25/2014   CO2 30 09/25/2014   TSH 0.92 09/25/2014   PSA 0.73 09/25/2014          Assessment & Plan:  Patient ID: John Boone, male   DOB: 05/29/1951, 64 y.o.   MRN: [id number]

## 2014-10-29 ENCOUNTER — Ambulatory Visit (INDEPENDENT_AMBULATORY_CARE_PROVIDER_SITE_OTHER): Payer: 59

## 2014-10-29 DIAGNOSIS — J309 Allergic rhinitis, unspecified: Secondary | ICD-10-CM

## 2014-11-01 ENCOUNTER — Ambulatory Visit: Payer: 59 | Admitting: Internal Medicine

## 2014-11-05 ENCOUNTER — Ambulatory Visit (INDEPENDENT_AMBULATORY_CARE_PROVIDER_SITE_OTHER): Payer: 59

## 2014-11-05 DIAGNOSIS — J309 Allergic rhinitis, unspecified: Secondary | ICD-10-CM

## 2014-11-08 ENCOUNTER — Ambulatory Visit (INDEPENDENT_AMBULATORY_CARE_PROVIDER_SITE_OTHER)
Admission: RE | Admit: 2014-11-08 | Discharge: 2014-11-08 | Disposition: A | Discharge status: Home / Self Care | Payer: 59 | Source: Ambulatory Visit | Attending: Internal Medicine | Admitting: Internal Medicine

## 2014-11-08 DIAGNOSIS — J320 Chronic maxillary sinusitis: Secondary | ICD-10-CM

## 2014-11-12 ENCOUNTER — Ambulatory Visit (INDEPENDENT_AMBULATORY_CARE_PROVIDER_SITE_OTHER): Payer: 59

## 2014-11-12 DIAGNOSIS — J309 Allergic rhinitis, unspecified: Secondary | ICD-10-CM

## 2014-11-19 ENCOUNTER — Ambulatory Visit (INDEPENDENT_AMBULATORY_CARE_PROVIDER_SITE_OTHER): Payer: 59

## 2014-11-19 DIAGNOSIS — J309 Allergic rhinitis, unspecified: Secondary | ICD-10-CM | POA: Diagnosis not present

## 2014-11-26 ENCOUNTER — Ambulatory Visit (INDEPENDENT_AMBULATORY_CARE_PROVIDER_SITE_OTHER): Payer: 59

## 2014-11-26 DIAGNOSIS — J309 Allergic rhinitis, unspecified: Secondary | ICD-10-CM | POA: Diagnosis not present

## 2014-12-03 ENCOUNTER — Ambulatory Visit (INDEPENDENT_AMBULATORY_CARE_PROVIDER_SITE_OTHER): Payer: 59

## 2014-12-03 DIAGNOSIS — J309 Allergic rhinitis, unspecified: Secondary | ICD-10-CM | POA: Diagnosis not present

## 2014-12-10 ENCOUNTER — Ambulatory Visit: Payer: 59

## 2014-12-10 ENCOUNTER — Ambulatory Visit (INDEPENDENT_AMBULATORY_CARE_PROVIDER_SITE_OTHER): Payer: 59

## 2014-12-10 DIAGNOSIS — J309 Allergic rhinitis, unspecified: Secondary | ICD-10-CM

## 2014-12-17 ENCOUNTER — Ambulatory Visit (INDEPENDENT_AMBULATORY_CARE_PROVIDER_SITE_OTHER): Payer: 59

## 2014-12-17 DIAGNOSIS — J309 Allergic rhinitis, unspecified: Secondary | ICD-10-CM

## 2014-12-24 ENCOUNTER — Ambulatory Visit (INDEPENDENT_AMBULATORY_CARE_PROVIDER_SITE_OTHER): Payer: 59

## 2014-12-24 DIAGNOSIS — J309 Allergic rhinitis, unspecified: Secondary | ICD-10-CM | POA: Diagnosis not present

## 2014-12-25 ENCOUNTER — Encounter: Payer: Self-pay | Admitting: Internal Medicine

## 2014-12-31 ENCOUNTER — Ambulatory Visit (INDEPENDENT_AMBULATORY_CARE_PROVIDER_SITE_OTHER): Payer: 59

## 2014-12-31 DIAGNOSIS — J309 Allergic rhinitis, unspecified: Secondary | ICD-10-CM

## 2015-01-01 ENCOUNTER — Other Ambulatory Visit (HOSPITAL_COMMUNITY): Payer: Self-pay | Admitting: Otolaryngology

## 2015-01-07 ENCOUNTER — Ambulatory Visit (INDEPENDENT_AMBULATORY_CARE_PROVIDER_SITE_OTHER): Payer: 59

## 2015-01-07 DIAGNOSIS — J309 Allergic rhinitis, unspecified: Secondary | ICD-10-CM | POA: Diagnosis not present

## 2015-01-14 ENCOUNTER — Ambulatory Visit (INDEPENDENT_AMBULATORY_CARE_PROVIDER_SITE_OTHER): Payer: 59

## 2015-01-14 DIAGNOSIS — J309 Allergic rhinitis, unspecified: Secondary | ICD-10-CM

## 2015-01-15 ENCOUNTER — Ambulatory Visit (INDEPENDENT_AMBULATORY_CARE_PROVIDER_SITE_OTHER): Payer: 59

## 2015-01-15 DIAGNOSIS — J309 Allergic rhinitis, unspecified: Secondary | ICD-10-CM | POA: Diagnosis not present

## 2015-01-20 ENCOUNTER — Ambulatory Visit (INDEPENDENT_AMBULATORY_CARE_PROVIDER_SITE_OTHER): Payer: 59

## 2015-01-20 ENCOUNTER — Ambulatory Visit (INDEPENDENT_AMBULATORY_CARE_PROVIDER_SITE_OTHER): Payer: 59 | Admitting: Internal Medicine

## 2015-01-20 ENCOUNTER — Encounter: Payer: Self-pay | Admitting: Internal Medicine

## 2015-01-20 VITALS — BP 126/84 | HR 76 | Ht 74.0 in | Wt 228.2 lb

## 2015-01-20 DIAGNOSIS — J45909 Unspecified asthma, uncomplicated: Secondary | ICD-10-CM

## 2015-01-20 DIAGNOSIS — J309 Allergic rhinitis, unspecified: Secondary | ICD-10-CM | POA: Diagnosis not present

## 2015-01-20 DIAGNOSIS — J301 Allergic rhinitis due to pollen: Secondary | ICD-10-CM

## 2015-01-20 MED ORDER — TIOTROPIUM BROMIDE-OLODATEROL 2.5-2.5 MCG/ACT IN AERS: 2.0000 | INHALATION_SPRAY | Freq: Every day | RESPIRATORY_TRACT | Status: DC

## 2015-01-20 NOTE — Progress Notes (Signed)
09/14/11- 60 yoM former smoker, followed for asthma, allergic rhinitis, complicated by AFib/PAT,  LOV-09/15/10 At work he is exposed to secondhand smoke which makes him sneeze. He has done well with maintenance allergy vaccine without need to change. In the last few weeks has resumed loratadine and Sudafed, questioning mold from wet ground. No asthma and a very rare need for rescue inhaler.  06/14/12- 60 yoM former smoker, followed for asthma, allergic rhinitis, complicated by AFib/PAT, Still on allergy vaccine 1:10 [facility] and doing well; states still has some stuffiness at times but better since last visit. On a long bike ride one week ago, he inhaled ? an insect. He began coughing immediately and still gets some persistent cough with clear mucus. Rescue inhaler has been some help.  06/14/13- 62 yoM former smoker, followed for asthma, allergic rhinitis, complicated by AFib/PAT, Still on allergy vaccine 1:10 [facility] FOLLOWS FOR: still on vaccine and doing well; denies any flare ups at this time. Easier DOE riding bike. No cough/ wheeze/ CP. Myocardial perfusion 07/26/12- EF 62% Allergy vaccine doing well. No hx of asbestos exposure- unexplained pleural plaques. CT chest 06/15/12 IMPRESSION:  1. The plain film abnormalities were likely secondary to partially  calcified pleural plaques bilaterally; indicative of asbestos  related pleural disease. No pulmonary nodules.  2. Mild centrilobular emphysema, without acute superimposed  process in the chest.  3. Esophageal air fluid level suggests dysmotility or  gastroesophageal reflux.  4. Multivessel coronary artery atherosclerosis.  Original Report Authenticated By: Areta Haber, M.D.  10/31/13- 25 yoM former smoker, followed for asthma, allergic rhinitis, pleural plaques(unexplained), complicated by AFib/PAT/ CAD, ACUTE VISIT: lots of sinus drainage in am-thick. Chest congestion("feels like bronchitis"). SOB as well. Was given Amoxicllin last week and no  help. Has been using Sudafed during the day-helps open sinus area Still on allergy vaccine 1:10 GH Nasal congestion with thick white since a cold in December. Early transient headache. No fever. Recently some cough. Using saline rinse, sudafed. Claritin x 2 weeks had little effect.Day 5/7 Augmentin. He again denies any knowledge of any previous asbestos exposure. CXR 06/11/13 IMPRESSION:  No acute finding. Calcified pleural plaques.  Electronically Signed  By: Inge Rise M.D.  On: 06/14/2013 16:21   05/01/2014 Acute OV   25 yoM former smoker, followed for asthma, allergic rhinitis, pleural plaques(unexplained), complicated by AFib/PAT/ CAD Complains of  throat congestion with clear/white mucus, head congestion w/ thick clear drainage, some clear/bloody nasal drainage, PND, sinus pressure. Sore throat worse at night. Taking Allegra without much help. Mucus is mainly clear.  Since last visit - doesn't feel the Nasonex given at last ov helps. Says he has on/off symptoms for last several months , worse for last few weeks.  2 cats in home and carpet.  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  He denies any fever, or hemoptysis, orthopnea, PND, leg swelling, abdominal pain, nausea, vomiting, or rash. Reports he has previously been on Singulair in the past and felt that it did help with his allergy symptoms Previously on allergy vaccine years ago.   06/18/14- 44 yoM former smoker/ 30 pk yrs, followed for asthma, allergic rhinitis, pleural plaques(unexplained), complicated by AFib/PAT/ CAD, FOLLOWS FOR: Allergies patient having throat drainage more at night, nasal drainage is better from last visit. Sinus pressure has alleviated,patient back on Nasonex.  CXR 11/12/13 FINDINGS:  The heart size and mediastinal contours are within normal limits.  Stable bilateral calcified pleural plaques compared to prior exam.  No  pneumothorax or pleural effusion is seen. No acute  pulmonary  disease is noted. The visualized skeletal structures are  unremarkable.  IMPRESSION:  No acute cardiopulmonary abnormality seen.  Electronically Signed  By: Sabino Dick M.D.  On: 11/12/2013 18:06  01/20/15- 69 yoM former smoker/ 30 pk yrs, followed for asthma, allergic rhinitis, pleural plaques(unexplained), complicated by AFib/PAT/ CAD, ACUTE VISIT: had sinus surgery/ nasal polypectomy about 2 weeks through [facility]; pt is currently have chest congestion with cough in am-clear colored phlegm-unsure of PND; hard to talk due to cough and lungs feeling tight. Wheezing as well. Not allergic to aspirin  Pt is still on Allergy  Vaccine 1:10 [place] He had a retinal bleed while on Advair so he thinks there is a connection.. CXR 07/31/14 IMPRESSION: No acute cardiopulmonary findings. 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 Electronically Signed  By: Kalman Jewels M.D.  On: 07/31/2014 15:41  ROS-see HPI Constitutional:   No-   weight loss, night sweats, fevers, chills, fatigue, lassitude. HEENT:   No headaches, difficulty swallowing, tooth/dental problems, +sore throat,       No-  sneezing, itching, ear ache,   + nasal congestion, post nasal drip,  CV:  No-   chest pain, orthopnea, PND, swelling in lower extremities, anasarca,  dizziness, palpitations Resp: +shortness of breath with exertion or at rest.              + productive cough,  + non-productive cough,  No- coughing up of blood.              No-   change in color of mucus.  No- wheezing.   Skin: No-   rash or lesions. GI:  No-   heartburn, indigestion, abdominal pain, nausea, GU: MS:  No-   joint pain or swelling.   Neuro-     nothing unusual Psych:  No- change in mood or affect. No depression or anxiety.  No memory loss.  OBJ General- Alert, Oriented, Affect-appropriate, Distress- none acute Skin- rash-none, lesions- none, excoriation- none. +Chronic rhinophyma  appearance Lymphadenopathy- none Head- atraumatic            Eyes- Gross vision intact, PERRLA, conjunctivae clear secretions            Ears- Hearing, canals-normal            Nose- + sl nasal turbinate edema, no-Septal dev, mucus, polyps, erosion, perforation             Throat- Mallampati II , mucosa clear , drainage- none, tonsils- atrophic Neck- flexible , trachea midline, no stridor , thyroid nl, carotid no bruit Chest - symmetrical excursion , unlabored           Heart/CV- RRR- rate controlled , no murmur , no gallop  , no rub, nl s1 s2                           - JVD- none , edema- none, stasis changes- none, varices- none           Lung- clear, wheeze- none, cough- none , dullness-none, rub- none           Chest wall-  Abd- Extrem- cyanosis- none, clubbing, none, atrophy- none, strength- nl Neuro- grossly intact to observation

## 2015-01-20 NOTE — Patient Instructions (Signed)
Sample Stiolto Respimat inhaler   Inhale 2 puffs, once daily  Ok to continue the fluticasone and azelastine nasal sprays from Dr Simeon Craft  We can continue allergy vaccine 1:10 [place]

## 2015-01-21 ENCOUNTER — Ambulatory Visit: Payer: 59

## 2015-01-21 NOTE — Assessment & Plan Note (Signed)
We can continue allergy vaccine which he feels helps. We discussed his nasal polyps. None are visible anteriorly. He is now on Flonase; hopefully they will be suppressed.

## 2015-01-21 NOTE — Assessment & Plan Note (Signed)
Asthma with bronchitis. Plan-sample of Stiolto Respimat inhaler, schedule PFT

## 2015-01-28 ENCOUNTER — Ambulatory Visit (INDEPENDENT_AMBULATORY_CARE_PROVIDER_SITE_OTHER): Payer: 59

## 2015-01-28 DIAGNOSIS — J309 Allergic rhinitis, unspecified: Secondary | ICD-10-CM

## 2015-01-31 ENCOUNTER — Telehealth: Payer: Self-pay | Admitting: Internal Medicine

## 2015-01-31 MED ORDER — TIOTROPIUM BROMIDE-OLODATEROL 2.5-2.5 MCG/ACT IN AERS: 2.0000 | INHALATION_SPRAY | Freq: Every day | RESPIRATORY_TRACT | Status: DC

## 2015-01-31 NOTE — Telephone Encounter (Signed)
Rx has been sent in. Pt is aware. Nothing further was needed. 

## 2015-02-04 ENCOUNTER — Ambulatory Visit (INDEPENDENT_AMBULATORY_CARE_PROVIDER_SITE_OTHER): Payer: 59

## 2015-02-04 DIAGNOSIS — J309 Allergic rhinitis, unspecified: Secondary | ICD-10-CM

## 2015-02-06 ENCOUNTER — Telehealth: Payer: Self-pay | Admitting: *Deleted

## 2015-02-06 NOTE — Telephone Encounter (Signed)
PA request for Stiolto Would CY like to initiate PA or change inhaler?

## 2015-02-06 NOTE — Telephone Encounter (Signed)
Can we see if his insurance will cover Anoro instead of Stiolto      Anoro, # 1,  Inhale 1 puff, once daily,  Ref x 11

## 2015-02-07 NOTE — Telephone Encounter (Signed)
Covered alternatives Advair and Dulera which patient has failed. Proceeded with PA for Stiolto. Approval valid 01/08/15-08/15/15. Approval # [id number] Pt and pharmacy notified.

## 2015-02-11 ENCOUNTER — Ambulatory Visit (INDEPENDENT_AMBULATORY_CARE_PROVIDER_SITE_OTHER): Payer: 59

## 2015-02-11 DIAGNOSIS — J309 Allergic rhinitis, unspecified: Secondary | ICD-10-CM

## 2015-02-18 ENCOUNTER — Ambulatory Visit (INDEPENDENT_AMBULATORY_CARE_PROVIDER_SITE_OTHER): Payer: 59

## 2015-02-18 DIAGNOSIS — J309 Allergic rhinitis, unspecified: Secondary | ICD-10-CM | POA: Diagnosis not present

## 2015-02-19 ENCOUNTER — Ambulatory Visit (INDEPENDENT_AMBULATORY_CARE_PROVIDER_SITE_OTHER): Payer: 59 | Admitting: Internal Medicine

## 2015-02-19 DIAGNOSIS — J45909 Unspecified asthma, uncomplicated: Secondary | ICD-10-CM | POA: Diagnosis not present

## 2015-02-19 LAB — PULMONARY FUNCTION TEST
DL/VA % pred: 83 %
DL/VA: 4.03 ml/min/mmHg/L
DLCO UNC % PRED: 82 %
DLCO unc: 31.13 ml/min/mmHg
FEF 25-75 Post: 3.36 L/sec
FEF 25-75 Pre: 3.52 L/sec
FEF2575-%Change-Post: -4 %
FEF2575-%PRED-POST: 104 %
FEF2575-%Pred-Pre: 109 %
FEV1-%Change-Post: -1 %
FEV1-%PRED-POST: 104 %
FEV1-%Pred-Pre: 105 %
FEV1-PRE: 4.26 L
FEV1-Post: 4.21 L
FEV1FVC-%Change-Post: -1 %
FEV1FVC-%PRED-PRE: 101 %
FEV6-%Change-Post: 0 %
FEV6-%Pred-Post: 108 %
FEV6-%Pred-Pre: 108 %
FEV6-Post: 5.57 L
FEV6-Pre: 5.58 L
FEV6FVC-%CHANGE-POST: 0 %
FEV6FVC-%Pred-Post: 103 %
FEV6FVC-%Pred-Pre: 103 %
FVC-%CHANGE-POST: 0 %
FVC-%PRED-PRE: 104 %
FVC-%Pred-Post: 104 %
FVC-Post: 5.62 L
FVC-Pre: 5.61 L
PRE FEV6/FVC RATIO: 100 %
Post FEV1/FVC ratio: 75 %
Post FEV6/FVC ratio: 99 %
Pre FEV1/FVC ratio: 76 %
RV % PRED: 99 %
RV: 2.5 L
TLC % pred: 106 %
TLC: 8.32 L

## 2015-02-19 NOTE — Progress Notes (Signed)
PFT done today. 

## 2015-02-25 ENCOUNTER — Ambulatory Visit (INDEPENDENT_AMBULATORY_CARE_PROVIDER_SITE_OTHER): Payer: 59

## 2015-02-25 DIAGNOSIS — J309 Allergic rhinitis, unspecified: Secondary | ICD-10-CM

## 2015-03-04 ENCOUNTER — Ambulatory Visit (INDEPENDENT_AMBULATORY_CARE_PROVIDER_SITE_OTHER): Payer: 59

## 2015-03-04 DIAGNOSIS — J309 Allergic rhinitis, unspecified: Secondary | ICD-10-CM | POA: Diagnosis not present

## 2015-03-11 ENCOUNTER — Ambulatory Visit (INDEPENDENT_AMBULATORY_CARE_PROVIDER_SITE_OTHER): Payer: 59

## 2015-03-11 DIAGNOSIS — J309 Allergic rhinitis, unspecified: Secondary | ICD-10-CM | POA: Diagnosis not present

## 2015-03-18 ENCOUNTER — Other Ambulatory Visit: Payer: Self-pay | Admitting: Geriatric Medicine

## 2015-03-18 ENCOUNTER — Ambulatory Visit (INDEPENDENT_AMBULATORY_CARE_PROVIDER_SITE_OTHER): Payer: 59

## 2015-03-18 DIAGNOSIS — J309 Allergic rhinitis, unspecified: Secondary | ICD-10-CM | POA: Diagnosis not present

## 2015-03-18 MED ORDER — METOPROLOL SUCCINATE ER 25 MG PO TB24: 25.0000 mg | ORAL_TABLET | Freq: Two times a day (BID) | ORAL | Status: DC

## 2015-03-21 ENCOUNTER — Encounter: Payer: Self-pay | Admitting: Internal Medicine

## 2015-03-25 ENCOUNTER — Ambulatory Visit (INDEPENDENT_AMBULATORY_CARE_PROVIDER_SITE_OTHER): Payer: 59

## 2015-03-25 DIAGNOSIS — J309 Allergic rhinitis, unspecified: Secondary | ICD-10-CM | POA: Diagnosis not present

## 2015-03-27 ENCOUNTER — Other Ambulatory Visit (INDEPENDENT_AMBULATORY_CARE_PROVIDER_SITE_OTHER): Payer: 59

## 2015-03-27 ENCOUNTER — Ambulatory Visit (INDEPENDENT_AMBULATORY_CARE_PROVIDER_SITE_OTHER): Payer: 59 | Admitting: Internal Medicine

## 2015-03-27 ENCOUNTER — Encounter: Payer: Self-pay | Admitting: Internal Medicine

## 2015-03-27 VITALS — BP 139/85 | HR 73 | Wt 234.0 lb

## 2015-03-27 DIAGNOSIS — J4520 Mild intermittent asthma, uncomplicated: Secondary | ICD-10-CM | POA: Diagnosis not present

## 2015-03-27 DIAGNOSIS — J320 Chronic maxillary sinusitis: Secondary | ICD-10-CM

## 2015-03-27 DIAGNOSIS — I471 Supraventricular tachycardia: Secondary | ICD-10-CM | POA: Diagnosis not present

## 2015-03-27 DIAGNOSIS — I10 Essential (primary) hypertension: Secondary | ICD-10-CM

## 2015-03-27 LAB — BASIC METABOLIC PANEL
BUN: 15 mg/dL (ref 6–23)
CHLORIDE: 103 meq/L (ref 96–112)
CO2: 28 meq/L (ref 19–32)
CREATININE: 1 mg/dL (ref 0.40–1.50)
Calcium: 9.5 mg/dL (ref 8.4–10.5)
GFR: 80.24 mL/min (ref >60.00)
GLUCOSE: 92 mg/dL (ref 70–99)
POTASSIUM: 4.4 meq/L (ref 3.5–5.1)
SODIUM: 139 meq/L (ref 135–145)

## 2015-03-27 MED ORDER — DOXYCYCLINE HYCLATE 100 MG PO TABS: 100.0000 mg | ORAL_TABLET | Freq: Two times a day (BID) | ORAL | Status: DC

## 2015-03-27 NOTE — Assessment & Plan Note (Signed)
- 

## 2015-03-27 NOTE — Progress Notes (Signed)
Pre visit review using our clinic review tool, if applicable. No additional management support is needed unless otherwise documented below in the visit note. 

## 2015-03-27 NOTE — Assessment & Plan Note (Signed)
Doing well  Proair prn 

## 2015-03-27 NOTE — Assessment & Plan Note (Signed)
Labs

## 2015-03-27 NOTE — Progress Notes (Signed)
Subjective:  Patient ID: John Boone, male    DOB: [birth year]  Age: 64 y.o. MRN: [id number]  CC: No chief complaint on file.   HPI John Boone presents for palpitations, HTN, sinus issues f/u  Outpatient Prescriptions Prior to Visit  Medication Sig Dispense Refill  . albuterol (PROVENTIL HFA) 108 (90 BASE) MCG/ACT inhaler Inhale 2 puffs into the lungs 4 (four) times daily as needed for shortness of breath. 1 Inhaler 6  . aspirin 325 MG tablet Take 325 mg by mouth daily.      Marland Kitchen azelastine (ASTELIN) 0.1 % nasal spray   11  . Cholecalciferol 1000 UNITS tablet Take 1,000 Units by mouth daily.      . fluticasone (FLONASE) 50 MCG/ACT nasal spray   11  . metoprolol succinate (TOPROL-XL) 25 MG 24 hr tablet Take 1 tablet (25 mg total) by mouth 2 (two) times daily. 60 tablet 11  . montelukast (SINGULAIR) 10 MG tablet Take 1 tablet (10 mg total) by mouth at bedtime. 30 tablet 11  . NON FORMULARY Inject into the skin. Allergy Shots 1:10 [facility]     . mometasone (NASONEX) 50 MCG/ACT nasal spray Place 2 sprays into the nose daily. (Patient not taking: Reported on 03/27/2015) 17 g 11  . Tiotropium Bromide-Olodaterol (STIOLTO RESPIMAT) 2.5-2.5 MCG/ACT AERS Inhale 2 puffs into the lungs daily. (Patient not taking: Reported on 03/27/2015) 1 Inhaler 5   No facility-administered medications prior to visit.    ROS Review of Systems  Constitutional: Negative for appetite change, fatigue and unexpected weight change.  HENT: Positive for postnasal drip, rhinorrhea and sinus pressure. Negative for congestion, nosebleeds, sneezing, sore throat and trouble swallowing.   Eyes: Negative for itching and visual disturbance.  Respiratory: Negative for cough, chest tightness and wheezing.   Cardiovascular: Negative for chest pain, palpitations and leg swelling.  Gastrointestinal: Negative for nausea, diarrhea, blood in stool and abdominal distention.  Genitourinary: Negative for frequency and hematuria.    Musculoskeletal: Negative for back pain, joint swelling, gait problem and neck pain.  Skin: Positive for rash.  Neurological: Negative for dizziness, tremors, speech difficulty and weakness.  Psychiatric/Behavioral: Negative for sleep disturbance, dysphoric mood and agitation. The patient is not nervous/anxious.     Objective:  BP 160/118 mmHg  Pulse 73  Wt 234 lb (106.142 kg)  SpO2 97%  BP Readings from Last 3 Encounters:  03/27/15 160/118  01/20/15 126/84  10/28/14 150/108    Wt Readings from Last 3 Encounters:  03/27/15 234 lb (106.142 kg)  01/20/15 228 lb 3.2 oz (103.511 kg)  10/28/14 230 lb 12 oz (104.668 kg)    Physical Exam  Constitutional: He is oriented to person, place, and time. He appears well-developed. No distress.  NAD  HENT:  Mouth/Throat: Oropharynx is clear and moist.  Eyes: Conjunctivae are normal. Pupils are equal, round, and reactive to light.  Neck: Normal range of motion. No JVD present. No thyromegaly present.  Cardiovascular: Normal rate, regular rhythm, normal heart sounds and intact distal pulses.  Exam reveals no gallop and no friction rub.   No murmur heard. Pulmonary/Chest: Effort normal and breath sounds normal. No respiratory distress. He has no wheezes. He has no rales. He exhibits no tenderness.  Abdominal: Soft. Bowel sounds are normal. He exhibits no distension and no mass. There is no tenderness. There is no rebound and no guarding.  Musculoskeletal: Normal range of motion. He exhibits no edema or tenderness.  Lymphadenopathy:    He has no  cervical adenopathy.  Neurological: He is alert and oriented to person, place, and time. He has normal reflexes. No cranial nerve deficit. He exhibits normal muscle tone. He displays a negative Romberg sign. Coordination and gait normal.  Skin: Skin is warm and dry. Rash noted.  Psychiatric: He has a normal mood and affect. His behavior is normal. Judgment and thought content normal.  Rosacea  Lab  Results  Component Value Date   WBC 6.6 09/25/2014   HGB 15.8 09/25/2014   HCT 46.3 09/25/2014   PLT 235.0 09/25/2014   GLUCOSE 87 09/25/2014   CHOL 208* 09/25/2014   TRIG 150.0* 09/25/2014   HDL 52.10 09/25/2014   LDLCALC 126* 09/25/2014   ALT 20 09/25/2014   AST 17 09/25/2014   NA 141 09/25/2014   K 4.5 09/25/2014   CL 105 09/25/2014   CREATININE 1.03 09/25/2014   BUN 19 09/25/2014   CO2 30 09/25/2014   TSH 0.92 09/25/2014   PSA 0.73 09/25/2014    Ct Maxillofacial Ltd Wo Cm  11/08/2014   CLINICAL DATA:  64 year old male with chronic sinus drainage, cough and sinus pressure for the past year. Five rounds of antibiotic therapy with no relief of symptoms.  EXAM: CT PARANASAL SINUS LIMITED WITHOUT CONTRAST  TECHNIQUE: Non-contiguous multidetector CT images of the paranasal sinuses were obtained in a single plane without contrast.  COMPARISON:  No priors.  FINDINGS: Multifocal mucosal thickening in the paranasal sinuses, most evident throughout the ethmoid sinuses, and to a lesser extent in the maxillary and frontal sinuses bilaterally. In the medial aspect of the right frontal sinus there is a polypoid lesion, which may represent a mucosal retention cyst or polyp, which measures approximately 1.8 x 1.2 cm. Notably, there is also a small 1.1 x 0.8 cm partially ossified lesion in the medial aspect of the right frontal sinus (image 13 of series 5), which could represent a very small osteoma. No significant nasal septal deviation. No air-fluid levels.  IMPRESSION: 1. Multifocal paranasal sinus disease, as above, without air-fluid levels to suggest an acute sinusitis at this time. 2. Small mucosal retention cyst or polyp in the medial aspect of the right frontal sinus. 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   Electronically Signed   By: Vinnie Langton M.D.   On: 11/08/2014 16:06    Assessment & Plan:   There are no diagnoses  linked to this encounter. I am having John Boone maintain his NON FORMULARY, aspirin, Cholecalciferol, albuterol, mometasone, montelukast, azelastine, fluticasone, Tiotropium Bromide-Olodaterol, metoprolol succinate, doxycycline, and mupirocin ointment.  Meds ordered this encounter  Medications  . doxycycline (VIBRA-TABS) 100 MG tablet    Sig: Take 100 mg by mouth 2 (two) times daily.    Refill:  5  . mupirocin ointment (BACTROBAN) 2 %    Sig: DISSOLVE ONE TUBE (22GRAMS) IN ONE LITER OF NORMAL SALINE IRRIGATE EACH NOSTRIL WITH 50ML TWICE /DAY    Refill:  11     Follow-up: No Follow-up on file.  Walker Kehr, MD

## 2015-03-27 NOTE — Patient Instructions (Signed)
TRE (trauma release exercise) 

## 2015-03-27 NOTE — Assessment & Plan Note (Signed)
Toprol XL 

## 2015-04-01 ENCOUNTER — Ambulatory Visit (INDEPENDENT_AMBULATORY_CARE_PROVIDER_SITE_OTHER): Payer: 59

## 2015-04-01 DIAGNOSIS — J309 Allergic rhinitis, unspecified: Secondary | ICD-10-CM

## 2015-04-07 ENCOUNTER — Ambulatory Visit (INDEPENDENT_AMBULATORY_CARE_PROVIDER_SITE_OTHER): Payer: 59

## 2015-04-07 DIAGNOSIS — J309 Allergic rhinitis, unspecified: Secondary | ICD-10-CM | POA: Diagnosis not present

## 2015-04-08 ENCOUNTER — Ambulatory Visit: Payer: 59

## 2015-04-15 ENCOUNTER — Ambulatory Visit (INDEPENDENT_AMBULATORY_CARE_PROVIDER_SITE_OTHER): Payer: 59

## 2015-04-15 DIAGNOSIS — J309 Allergic rhinitis, unspecified: Secondary | ICD-10-CM | POA: Diagnosis not present

## 2015-04-18 ENCOUNTER — Encounter: Payer: Self-pay | Admitting: Internal Medicine

## 2015-04-22 ENCOUNTER — Ambulatory Visit (INDEPENDENT_AMBULATORY_CARE_PROVIDER_SITE_OTHER): Payer: 59

## 2015-04-22 DIAGNOSIS — J309 Allergic rhinitis, unspecified: Secondary | ICD-10-CM

## 2015-04-29 ENCOUNTER — Ambulatory Visit (INDEPENDENT_AMBULATORY_CARE_PROVIDER_SITE_OTHER): Payer: 59

## 2015-04-29 DIAGNOSIS — J309 Allergic rhinitis, unspecified: Secondary | ICD-10-CM

## 2015-05-06 ENCOUNTER — Ambulatory Visit: Payer: 59

## 2015-05-06 ENCOUNTER — Encounter: Payer: Self-pay | Admitting: Internal Medicine

## 2015-05-06 ENCOUNTER — Ambulatory Visit (INDEPENDENT_AMBULATORY_CARE_PROVIDER_SITE_OTHER): Payer: 59 | Admitting: Internal Medicine

## 2015-05-06 VITALS — BP 180/120 | HR 79 | Temp 98.9°F | Wt 236.0 lb

## 2015-05-06 DIAGNOSIS — R21 Rash and other nonspecific skin eruption: Secondary | ICD-10-CM | POA: Diagnosis not present

## 2015-05-06 MED ORDER — MOMETASONE FUROATE 0.1 % EX OINT: TOPICAL_OINTMENT | Freq: Every day | CUTANEOUS | Status: DC

## 2015-05-06 MED ORDER — HYDROXYZINE HCL 25 MG PO TABS: 25.0000 mg | ORAL_TABLET | Freq: Three times a day (TID) | ORAL | Status: DC | PRN

## 2015-05-06 MED ORDER — METHYLPREDNISOLONE ACETATE 80 MG/ML IJ SUSP
80.0000 mg | Freq: Once | INTRAMUSCULAR | Status: AC
  Administered 2015-05-06: 80 mg via INTRAMUSCULAR

## 2015-05-06 NOTE — Assessment & Plan Note (Signed)
9/16 likely chiggers  Depomedrol Elocone cream

## 2015-05-06 NOTE — Progress Notes (Signed)
Pre visit review using our clinic review tool, if applicable. No additional management support is needed unless otherwise documented below in the visit note. 

## 2015-05-06 NOTE — Progress Notes (Signed)
Subjective:  Patient ID: John Boone, male    DOB: 02-Jan-1951  Age: 64 y.o. MRN: [id number]  CC: Rash   HPI IZAYIAH TIBBITTS presents for a bad rash x 2 d - it started a few hrs after he cut his grass. C/o severe itching - he was not able to sleep last night...  Outpatient Prescriptions Prior to Visit  Medication Sig Dispense Refill  . albuterol (PROVENTIL HFA) 108 (90 BASE) MCG/ACT inhaler Inhale 2 puffs into the lungs 4 (four) times daily as needed for shortness of breath. 1 Inhaler 6  . aspirin 325 MG tablet Take 325 mg by mouth daily.      Marland Kitchen azelastine (ASTELIN) 0.1 % nasal spray   11  . Cholecalciferol 1000 UNITS tablet Take 1,000 Units by mouth daily.      Marland Kitchen doxycycline (VIBRA-TABS) 100 MG tablet Take 1 tablet (100 mg total) by mouth 2 (two) times daily. 60 tablet 5  . fluticasone (FLONASE) 50 MCG/ACT nasal spray   11  . metoprolol succinate (TOPROL-XL) 25 MG 24 hr tablet Take 1 tablet (25 mg total) by mouth 2 (two) times daily. 60 tablet 11  . mometasone (NASONEX) 50 MCG/ACT nasal spray Place 2 sprays into the nose daily. 17 g 11  . montelukast (SINGULAIR) 10 MG tablet Take 1 tablet (10 mg total) by mouth at bedtime. 30 tablet 11  . mupirocin ointment (BACTROBAN) 2 % DISSOLVE ONE TUBE (22GRAMS) IN ONE LITER OF NORMAL SALINE IRRIGATE EACH NOSTRIL WITH 50ML TWICE /DAY  11  . NON FORMULARY Inject into the skin. Allergy Shots 1:10 [facility]     . NONFORMULARY OR COMPOUNDED ITEM Allergy Vaccine 1:10 Given at [facility]    . Tiotropium Bromide-Olodaterol (STIOLTO RESPIMAT) 2.5-2.5 MCG/ACT AERS Inhale 2 puffs into the lungs daily. 1 Inhaler 5   No facility-administered medications prior to visit.    ROS Review of Systems  Constitutional: Negative for chills, appetite change, fatigue and unexpected weight change.  HENT: Negative for congestion, nosebleeds, sneezing, sore throat and trouble swallowing.   Eyes: Negative for itching and visual disturbance.  Respiratory: Negative for  cough.   Cardiovascular: Negative for chest pain, palpitations and leg swelling.  Gastrointestinal: Negative for nausea, diarrhea, blood in stool and abdominal distention.  Genitourinary: Negative for frequency and hematuria.  Musculoskeletal: Negative for back pain, joint swelling, gait problem and neck pain.  Skin: Positive for rash.  Neurological: Negative for dizziness, tremors, speech difficulty and weakness.  Psychiatric/Behavioral: Negative for sleep disturbance, dysphoric mood and agitation. The patient is not nervous/anxious.     Objective:  BP 180/120 mmHg  Pulse 79  Temp(Src) 98.9 F (37.2 C) (Oral)  Wt 236 lb (107.049 kg)  SpO2 97%  BP Readings from Last 3 Encounters:  05/06/15 180/120  03/27/15 139/85  01/20/15 126/84    Wt Readings from Last 3 Encounters:  05/06/15 236 lb (107.049 kg)  03/27/15 234 lb (106.142 kg)  01/20/15 228 lb 3.2 oz (103.511 kg)    Physical Exam  Constitutional: He is oriented to person, place, and time. He appears well-developed. No distress.  NAD  HENT:  Mouth/Throat: Oropharynx is clear and moist.  Eyes: Conjunctivae are normal. Pupils are equal, round, and reactive to light.  Neck: Normal range of motion. No JVD present. No thyromegaly present.  Cardiovascular: Normal rate, regular rhythm, normal heart sounds and intact distal pulses.  Exam reveals no gallop and no friction rub.   No murmur heard. Pulmonary/Chest: Effort normal  and breath sounds normal. No respiratory distress. He has no wheezes. He has no rales. He exhibits no tenderness.  Abdominal: Soft. Bowel sounds are normal. He exhibits no distension and no mass. There is no tenderness. There is no rebound and no guarding.  Musculoskeletal: Normal range of motion. He exhibits no edema or tenderness.  Lymphadenopathy:    He has no cervical adenopathy.  Neurological: He is alert and oriented to person, place, and time. He has normal reflexes. No cranial nerve deficit. He  exhibits normal muscle tone. He displays a negative Romberg sign. Coordination and gait normal.  Skin: Skin is warm and dry. Rash noted.  Psychiatric: He has a normal mood and affect. His behavior is normal. Judgment and thought content normal.  extensive eryth papular rash on buttocks, groin, trunk (3-4 mm papules); some on LEs, arms  Lab Results  Component Value Date   WBC 6.6 09/25/2014   HGB 15.8 09/25/2014   HCT 46.3 09/25/2014   PLT 235.0 09/25/2014   GLUCOSE 92 03/27/2015   CHOL 208* 09/25/2014   TRIG 150.0* 09/25/2014   HDL 52.10 09/25/2014   LDLCALC 126* 09/25/2014   ALT 20 09/25/2014   AST 17 09/25/2014   NA 139 03/27/2015   K 4.4 03/27/2015   CL 103 03/27/2015   CREATININE 1.00 03/27/2015   BUN 15 03/27/2015   CO2 28 03/27/2015   TSH 0.92 09/25/2014   PSA 0.73 09/25/2014    Ct Maxillofacial Ltd Wo Cm  11/08/2014   CLINICAL DATA:  64 year old male with chronic sinus drainage, cough and sinus pressure for the past year. Five rounds of antibiotic therapy with no relief of symptoms.  EXAM: CT PARANASAL SINUS LIMITED WITHOUT CONTRAST  TECHNIQUE: Non-contiguous multidetector CT images of the paranasal sinuses were obtained in a single plane without contrast.  COMPARISON:  No priors.  FINDINGS: Multifocal mucosal thickening in the paranasal sinuses, most evident throughout the ethmoid sinuses, and to a lesser extent in the maxillary and frontal sinuses bilaterally. In the medial aspect of the right frontal sinus there is a polypoid lesion, which may represent a mucosal retention cyst or polyp, which measures approximately 1.8 x 1.2 cm. Notably, there is also a small 1.1 x 0.8 cm partially ossified lesion in the medial aspect of the right frontal sinus (image 13 of series 5), which could represent a very small osteoma. No significant nasal septal deviation. No air-fluid levels.  IMPRESSION: 1. Multifocal paranasal sinus disease, as above, without air-fluid levels to suggest an acute  sinusitis at this time. 2. Small mucosal retention cyst or polyp in the medial aspect of the right frontal sinus. 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   Electronically Signed   By: Vinnie Langton M.D.   On: 11/08/2014 16:06    Assessment & Plan:   There are no diagnoses linked to this encounter. I am having Mr. Lamoreaux maintain his NON FORMULARY, aspirin, Cholecalciferol, albuterol, mometasone, montelukast, azelastine, fluticasone, Tiotropium Bromide-Olodaterol, metoprolol succinate, mupirocin ointment, doxycycline, and NONFORMULARY OR COMPOUNDED ITEM.  No orders of the defined types were placed in this encounter.     Follow-up: No Follow-up on file.  Walker Kehr, MD

## 2015-05-10 ENCOUNTER — Other Ambulatory Visit: Payer: Self-pay | Admitting: Adult Health

## 2015-05-13 ENCOUNTER — Ambulatory Visit (INDEPENDENT_AMBULATORY_CARE_PROVIDER_SITE_OTHER): Payer: 59

## 2015-05-13 DIAGNOSIS — J309 Allergic rhinitis, unspecified: Secondary | ICD-10-CM

## 2015-05-20 ENCOUNTER — Ambulatory Visit (INDEPENDENT_AMBULATORY_CARE_PROVIDER_SITE_OTHER): Payer: 59

## 2015-05-20 DIAGNOSIS — J309 Allergic rhinitis, unspecified: Secondary | ICD-10-CM | POA: Diagnosis not present

## 2015-05-27 ENCOUNTER — Ambulatory Visit (INDEPENDENT_AMBULATORY_CARE_PROVIDER_SITE_OTHER): Payer: 59

## 2015-05-27 ENCOUNTER — Telehealth: Payer: Self-pay | Admitting: Internal Medicine

## 2015-05-27 DIAGNOSIS — J309 Allergic rhinitis, unspecified: Secondary | ICD-10-CM | POA: Diagnosis not present

## 2015-05-27 NOTE — Telephone Encounter (Signed)
Allergy Serum Extract Date Mixed: 05/27/15 Vial: 1 Strength: 1:10 Here/Mail/Pick Up: here Mixed By: tbs 

## 2015-06-03 ENCOUNTER — Ambulatory Visit (INDEPENDENT_AMBULATORY_CARE_PROVIDER_SITE_OTHER): Payer: 59

## 2015-06-03 DIAGNOSIS — J309 Allergic rhinitis, unspecified: Secondary | ICD-10-CM

## 2015-06-10 ENCOUNTER — Ambulatory Visit (INDEPENDENT_AMBULATORY_CARE_PROVIDER_SITE_OTHER): Payer: 59

## 2015-06-10 DIAGNOSIS — J309 Allergic rhinitis, unspecified: Secondary | ICD-10-CM | POA: Diagnosis not present

## 2015-06-17 ENCOUNTER — Ambulatory Visit (INDEPENDENT_AMBULATORY_CARE_PROVIDER_SITE_OTHER): Payer: 59

## 2015-06-17 DIAGNOSIS — J309 Allergic rhinitis, unspecified: Secondary | ICD-10-CM | POA: Diagnosis not present

## 2015-06-24 ENCOUNTER — Ambulatory Visit (INDEPENDENT_AMBULATORY_CARE_PROVIDER_SITE_OTHER): Payer: 59

## 2015-06-24 DIAGNOSIS — J309 Allergic rhinitis, unspecified: Secondary | ICD-10-CM

## 2015-07-01 ENCOUNTER — Ambulatory Visit (INDEPENDENT_AMBULATORY_CARE_PROVIDER_SITE_OTHER): Payer: 59

## 2015-07-01 DIAGNOSIS — J309 Allergic rhinitis, unspecified: Secondary | ICD-10-CM | POA: Diagnosis not present

## 2015-07-08 ENCOUNTER — Ambulatory Visit (INDEPENDENT_AMBULATORY_CARE_PROVIDER_SITE_OTHER): Payer: 59

## 2015-07-08 DIAGNOSIS — J309 Allergic rhinitis, unspecified: Secondary | ICD-10-CM | POA: Diagnosis not present

## 2015-07-15 ENCOUNTER — Ambulatory Visit (INDEPENDENT_AMBULATORY_CARE_PROVIDER_SITE_OTHER): Payer: 59

## 2015-07-15 DIAGNOSIS — J309 Allergic rhinitis, unspecified: Secondary | ICD-10-CM | POA: Diagnosis not present

## 2015-07-22 ENCOUNTER — Ambulatory Visit (INDEPENDENT_AMBULATORY_CARE_PROVIDER_SITE_OTHER): Payer: 59

## 2015-07-22 DIAGNOSIS — J309 Allergic rhinitis, unspecified: Secondary | ICD-10-CM | POA: Diagnosis not present

## 2015-07-23 ENCOUNTER — Ambulatory Visit (INDEPENDENT_AMBULATORY_CARE_PROVIDER_SITE_OTHER): Payer: 59 | Admitting: Internal Medicine

## 2015-07-23 ENCOUNTER — Encounter: Payer: Self-pay | Admitting: Internal Medicine

## 2015-07-23 VITALS — BP 138/80 | HR 74 | Ht 74.0 in | Wt 245.6 lb

## 2015-07-23 DIAGNOSIS — J302 Other seasonal allergic rhinitis: Secondary | ICD-10-CM

## 2015-07-23 NOTE — Patient Instructions (Signed)
We can continue allergy shots another year. Suggest we try every other week to see how that does for you.   Please call as needed

## 2015-07-23 NOTE — Progress Notes (Signed)
09/14/11- 60 yoM former smoker, followed for asthma, allergic rhinitis, complicated by AFib/PAT,  LOV-09/15/10 At work he is exposed to secondhand smoke which makes him sneeze. He has done well with maintenance allergy vaccine without need to change. In the last few weeks has resumed loratadine and Sudafed, questioning mold from wet ground. No asthma and a very rare need for rescue inhaler.  06/14/12- 60 yoM former smoker, followed for asthma, allergic rhinitis, complicated by AFib/PAT, Still on allergy vaccine 1:10 [facility] and doing well; states still has some stuffiness at times but better since last visit. On a long bike ride one week ago, he inhaled ? an insect. He began coughing immediately and still gets some persistent cough with clear mucus. Rescue inhaler has been some help.  06/14/13- 62 yoM former smoker, followed for asthma, allergic rhinitis, complicated by AFib/PAT, Still on allergy vaccine 1:10 [facility] FOLLOWS FOR: still on vaccine and doing well; denies any flare ups at this time. Easier DOE riding bike. No cough/ wheeze/ CP. Myocardial perfusion 07/26/12- EF 62% Allergy vaccine doing well. No hx of asbestos exposure- unexplained pleural plaques. CT chest 06/15/12 IMPRESSION:  1. The plain film abnormalities were likely secondary to partially  calcified pleural plaques bilaterally; indicative of asbestos  related pleural disease. No pulmonary nodules.  2. Mild centrilobular emphysema, without acute superimposed  process in the chest.  3. Esophageal air fluid level suggests dysmotility or  gastroesophageal reflux.  4. Multivessel coronary artery atherosclerosis.  Original Report Authenticated By: Areta Haber, M.D.  10/31/13- 25 yoM former smoker, followed for asthma, allergic rhinitis, pleural plaques(unexplained), complicated by AFib/PAT/ CAD, ACUTE VISIT: lots of sinus drainage in am-thick. Chest congestion("feels like bronchitis"). SOB as well. Was given Amoxicllin last week and no  help. Has been using Sudafed during the day-helps open sinus area Still on allergy vaccine 1:10 [facility] Nasal congestion with thick white since a cold in December. Early transient headache. No fever. Recently some cough. Using saline rinse, sudafed. Claritin x 2 weeks had little effect.Day 5/7 Augmentin. He again denies any knowledge of any previous asbestos exposure. CXR 06/11/13 IMPRESSION:  No acute finding. Calcified pleural plaques.  Electronically Signed  By: Inge Rise M.D.  On: 06/14/2013 16:21   05/01/2014 Acute OV   25 yoM former smoker, followed for asthma, allergic rhinitis, pleural plaques(unexplained), complicated by AFib/PAT/ CAD Complains of  throat congestion with clear/white mucus, head congestion w/ thick clear drainage, some clear/bloody nasal drainage, PND, sinus pressure. Sore throat worse at night. Taking Allegra without much help. Mucus is mainly clear.  Since last visit - doesn't feel the Nasonex given at last ov helps. Says he has on/off symptoms for last several months , worse for last few weeks.  2 cats in home and carpet.  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  He denies any fever, or hemoptysis, orthopnea, PND, leg swelling, abdominal pain, nausea, vomiting, or rash. Reports he has previously been on Singulair in the past and felt that it did help with his allergy symptoms Previously on allergy vaccine years ago.   06/18/14- 44 yoM former smoker/ 30 pk yrs, followed for asthma, allergic rhinitis, pleural plaques(unexplained), complicated by AFib/PAT/ CAD, FOLLOWS FOR: Allergies patient having throat drainage more at night, nasal drainage is better from last visit. Sinus pressure has alleviated,patient back on Nasonex.  CXR 11/12/13 FINDINGS:  The heart size and mediastinal contours are within normal limits.  Stable bilateral calcified pleural plaques compared to prior exam.  No  pneumothorax or pleural effusion is seen. No acute  pulmonary  disease is noted. The visualized skeletal structures are  unremarkable.  IMPRESSION:  No acute cardiopulmonary abnormality seen.  Electronically Signed  By: Sabino Dick M.D.  On: 11/12/2013 18:06  01/20/15- 33 yoM former smoker/ 30 pk yrs, followed for asthma, allergic rhinitis, pleural plaques(unexplained), complicated by AFib/PAT/ CAD, ACUTE VISIT: had sinus surgery/ nasal polypectomy about 2 weeks through [facility]; pt is currently have chest congestion with cough in am-clear colored phlegm-unsure of PND; hard to talk due to cough and lungs feeling tight. Wheezing as well. Not allergic to aspirin  Pt is still on Allergy  Vaccine 1:10 [place] He had a retinal bleed while on Advair so he thinks there is a connection.. CXR 07/31/14 IMPRESSION: No acute cardiopulmonary findings. 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 Electronically Signed  By: Kalman Jewels M.D.  On: 07/31/2014 15:41  07/23/2015-64 year old male former smoker/30 pack years, followed for asthma, allergic rhinitis, pleural plaques (unexplained), chronic sinusitis complicated by AFib/SVT/CAD, rosacea Allergy Vaccine 1:10 [facility] Follows For: pt states he is doing well. pt receiving allery vaccine once a week, and working well for pt. pt states his bretahing has been good. no c/o SOB, wheezing,cough or chest tightness. no concerns at this time.  CT MaxFac 11/08/14 IMPRESSION: 1. Multifocal paranasal sinus disease, as above, without air-fluid levels to suggest an acute sinusitis at this time. 2. Small mucosal retention cyst or polyp in the medial aspect of the right frontal sinus. 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 Electronically Signed  By: Vinnie Langton M.D.  On: 11/08/2014 16:06  ROS-see HPI Constitutional:   No-   weight loss, night sweats, fevers, chills, fatigue, lassitude. HEENT:   No  headaches, difficulty swallowing, tooth/dental problems, +sore throat,       No-  sneezing, itching, ear ache,   + nasal congestion, post nasal drip,  CV:  No-   chest pain, orthopnea, PND, swelling in lower extremities, anasarca,  dizziness, palpitations Resp: +shortness of breath with exertion or at rest.              + productive cough,  + non-productive cough,  No- coughing up of blood.              No-   change in color of mucus.  No- wheezing.   Skin: No-   rash or lesions. GI:  No-   heartburn, indigestion, abdominal pain, nausea, GU: MS:  No-   joint pain or swelling.   Neuro-     nothing unusual Psych:  No- change in mood or affect. No depression or anxiety.  No memory loss.  OBJ General- Alert, Oriented, Affect-appropriate, Distress- none acute Skin- rash-none, lesions- none, excoriation- none. +Chronic rhinophyma appearance Lymphadenopathy- none Head- atraumatic            Eyes- Gross vision intact, PERRLA, conjunctivae clear secretions            Ears- Hearing, canals-normal            Nose- + sl nasal turbinate edema, no-Septal dev, mucus, polyps, erosion, perforation             Throat- Mallampati II , mucosa clear , drainage- none, tonsils- atrophic Neck- flexible , trachea midline, no stridor , thyroid nl, carotid no bruit Chest - symmetrical excursion , unlabored           Heart/CV-  RRR- rate controlled , no murmur , no gallop  , no rub, nl s1 s2                           - JVD- none , edema- none, stasis changes- none, varices- none           Lung- clear, wheeze- none, cough- none , dullness-none, rub- none           Chest wall-  Abd- Extrem- cyanosis- none, clubbing, none, atrophy- none, strength- nl Neuro- grossly intact to observation

## 2015-07-29 ENCOUNTER — Ambulatory Visit (INDEPENDENT_AMBULATORY_CARE_PROVIDER_SITE_OTHER): Payer: 59

## 2015-07-29 DIAGNOSIS — J309 Allergic rhinitis, unspecified: Secondary | ICD-10-CM

## 2015-08-05 ENCOUNTER — Other Ambulatory Visit: Payer: Self-pay | Admitting: Dermatology

## 2015-08-05 ENCOUNTER — Ambulatory Visit (INDEPENDENT_AMBULATORY_CARE_PROVIDER_SITE_OTHER): Payer: 59

## 2015-08-05 DIAGNOSIS — J309 Allergic rhinitis, unspecified: Secondary | ICD-10-CM | POA: Diagnosis not present

## 2015-08-07 ENCOUNTER — Encounter: Payer: Self-pay | Admitting: Internal Medicine

## 2015-08-12 ENCOUNTER — Ambulatory Visit (INDEPENDENT_AMBULATORY_CARE_PROVIDER_SITE_OTHER): Payer: 59

## 2015-08-12 DIAGNOSIS — J309 Allergic rhinitis, unspecified: Secondary | ICD-10-CM | POA: Diagnosis not present

## 2015-08-18 ENCOUNTER — Encounter: Payer: Self-pay | Admitting: Internal Medicine

## 2015-08-19 ENCOUNTER — Ambulatory Visit (INDEPENDENT_AMBULATORY_CARE_PROVIDER_SITE_OTHER): Payer: 59

## 2015-08-19 DIAGNOSIS — J309 Allergic rhinitis, unspecified: Secondary | ICD-10-CM | POA: Diagnosis not present

## 2015-08-26 ENCOUNTER — Ambulatory Visit (INDEPENDENT_AMBULATORY_CARE_PROVIDER_SITE_OTHER): Payer: 59

## 2015-08-26 DIAGNOSIS — J309 Allergic rhinitis, unspecified: Secondary | ICD-10-CM

## 2015-09-09 ENCOUNTER — Ambulatory Visit (INDEPENDENT_AMBULATORY_CARE_PROVIDER_SITE_OTHER): Payer: 59

## 2015-09-09 DIAGNOSIS — J309 Allergic rhinitis, unspecified: Secondary | ICD-10-CM

## 2015-09-15 ENCOUNTER — Encounter: Payer: Self-pay | Admitting: Gastroenterology

## 2015-09-23 ENCOUNTER — Ambulatory Visit (INDEPENDENT_AMBULATORY_CARE_PROVIDER_SITE_OTHER): Payer: 59

## 2015-09-23 DIAGNOSIS — J309 Allergic rhinitis, unspecified: Secondary | ICD-10-CM | POA: Diagnosis not present

## 2015-09-29 ENCOUNTER — Encounter: Payer: Self-pay | Admitting: Internal Medicine

## 2015-09-29 ENCOUNTER — Ambulatory Visit (INDEPENDENT_AMBULATORY_CARE_PROVIDER_SITE_OTHER): Payer: 59 | Admitting: Internal Medicine

## 2015-09-29 VITALS — BP 140/92 | HR 92 | Temp 98.3°F | Ht 74.0 in | Wt 237.2 lb

## 2015-09-29 DIAGNOSIS — Z0000 Encounter for general adult medical examination without abnormal findings: Secondary | ICD-10-CM

## 2015-09-29 DIAGNOSIS — J320 Chronic maxillary sinusitis: Secondary | ICD-10-CM | POA: Diagnosis not present

## 2015-09-29 DIAGNOSIS — K2100 Gastro-esophageal reflux disease with esophagitis, without bleeding: Secondary | ICD-10-CM

## 2015-09-29 DIAGNOSIS — I10 Essential (primary) hypertension: Secondary | ICD-10-CM

## 2015-09-29 DIAGNOSIS — K219 Gastro-esophageal reflux disease without esophagitis: Secondary | ICD-10-CM | POA: Insufficient documentation

## 2015-09-29 MED ORDER — MUPIROCIN 2 % EX OINT: TOPICAL_OINTMENT | CUTANEOUS | Status: DC

## 2015-09-29 MED ORDER — PANTOPRAZOLE SODIUM 40 MG PO TBEC: 40.0000 mg | DELAYED_RELEASE_TABLET | Freq: Every day | ORAL | Status: DC

## 2015-09-29 MED ORDER — DOXYCYCLINE HYCLATE 100 MG PO TABS: 100.0000 mg | ORAL_TABLET | Freq: Two times a day (BID) | ORAL | Status: DC

## 2015-09-29 NOTE — Progress Notes (Signed)
Pre visit review using our clinic review tool, if applicable. No additional management support is needed unless otherwise documented below in the visit note. 

## 2015-09-29 NOTE — Assessment & Plan Note (Signed)
Chronic  ?SBP at home 130's-140 ?Toprol XL ?

## 2015-09-29 NOTE — Assessment & Plan Note (Signed)
1/17 Protonix

## 2015-09-29 NOTE — Assessment & Plan Note (Signed)
S/p surgery 2016 Singulair, Dymista UV light Bactroban oint

## 2015-09-29 NOTE — Progress Notes (Signed)
Subjective:  Patient ID: John Boone, male    DOB: 10/06/1950  Age: 65 y.o. MRN: [id number]  CC: No chief complaint on file.   HPI John Boone presents for mucus chocking at night, ?GERD   Outpatient Prescriptions Prior to Visit  Medication Sig Dispense Refill  . albuterol (PROVENTIL HFA) 108 (90 BASE) MCG/ACT inhaler Inhale 2 puffs into the lungs 4 (four) times daily as needed for shortness of breath. 1 Inhaler 6  . aspirin 325 MG tablet Take 325 mg by mouth daily.      Marland Kitchen azelastine (ASTELIN) 0.1 % nasal spray   11  . Cholecalciferol 1000 UNITS tablet Take 1,000 Units by mouth daily.      Marland Kitchen doxycycline (VIBRA-TABS) 100 MG tablet Take 1 tablet (100 mg total) by mouth 2 (two) times daily. 60 tablet 5  . fluticasone (FLONASE) 50 MCG/ACT nasal spray   11  . metoprolol succinate (TOPROL-XL) 25 MG 24 hr tablet Take 1 tablet (25 mg total) by mouth 2 (two) times daily. 60 tablet 11  . montelukast (SINGULAIR) 10 MG tablet TAKE 1 TABLET BY MOUTH AT BEDTIME 30 tablet 5  . NON FORMULARY Inject into the skin. Allergy Shots 1:10 GH     . NONFORMULARY OR COMPOUNDED ITEM Allergy Vaccine 1:10 Given at [facility]    . Tiotropium Bromide-Olodaterol (STIOLTO RESPIMAT) 2.5-2.5 MCG/ACT AERS Inhale 2 puffs into the lungs daily. 1 Inhaler 5   No facility-administered medications prior to visit.    ROS Review of Systems  Objective:  BP 140/92 mmHg  Pulse 92  Temp(Src) 98.3 F (36.8 C) (Oral)  Ht 6\' 2"  (1.88 m)  Wt 237 lb 4 oz (107.616 kg)  BMI 30.45 kg/m2  SpO2 97%  BP Readings from Last 3 Encounters:  09/29/15 140/92  07/23/15 138/80  05/06/15 180/120    Wt Readings from Last 3 Encounters:  09/29/15 237 lb 4 oz (107.616 kg)  07/23/15 245 lb 9.6 oz (111.403 kg)  05/06/15 236 lb (107.049 kg)    Physical Exam  Lab Results  Component Value Date   WBC 6.6 09/25/2014   HGB 15.8 09/25/2014   HCT 46.3 09/25/2014   PLT 235.0 09/25/2014   GLUCOSE 92 03/27/2015   CHOL 208*  09/25/2014   TRIG 150.0* 09/25/2014   HDL 52.10 09/25/2014   LDLCALC 126* 09/25/2014   ALT 20 09/25/2014   AST 17 09/25/2014   NA 139 03/27/2015   K 4.4 03/27/2015   CL 103 03/27/2015   CREATININE 1.00 03/27/2015   BUN 15 03/27/2015   CO2 28 03/27/2015   TSH 0.92 09/25/2014   PSA 0.73 09/25/2014    Ct Maxillofacial Ltd Wo Cm  11/08/2014  CLINICAL DATA:  65 year old male with chronic sinus drainage, cough and sinus pressure for the past year. Five rounds of antibiotic therapy with no relief of symptoms. EXAM: CT PARANASAL SINUS LIMITED WITHOUT CONTRAST TECHNIQUE: Non-contiguous multidetector CT images of the paranasal sinuses were obtained in a single plane without contrast. COMPARISON:  No priors. FINDINGS: Multifocal mucosal thickening in the paranasal sinuses, most evident throughout the ethmoid sinuses, and to a lesser extent in the maxillary and frontal sinuses bilaterally. In the medial aspect of the right frontal sinus there is a polypoid lesion, which may represent a mucosal retention cyst or polyp, which measures approximately 1.8 x 1.2 cm. Notably, there is also a small 1.1 x 0.8 cm partially ossified lesion in the medial aspect of the right frontal sinus (image  13 of series 5), which could represent a very small osteoma. No significant nasal septal deviation. No air-fluid levels. IMPRESSION: 1. Multifocal paranasal sinus disease, as above, without air-fluid levels to suggest an acute sinusitis at this time. 2. Small mucosal retention cyst or polyp in the medial aspect of the right frontal sinus. 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 Electronically Signed   By: Vinnie Langton M.D.   On: 11/08/2014 16:06    Assessment & Plan:   There are no diagnoses linked to this encounter. I am having Mr. Mcnicholas maintain his NON FORMULARY, aspirin, Cholecalciferol, albuterol, azelastine, fluticasone, Tiotropium Bromide-Olodaterol,  metoprolol succinate, doxycycline, NONFORMULARY OR COMPOUNDED ITEM, and montelukast.  No orders of the defined types were placed in this encounter.     Follow-up: No Follow-up on file.  Walker Kehr, MD

## 2015-09-29 NOTE — Assessment & Plan Note (Signed)
We discussed age appropriate health related issues, including available/recomended screening tests and vaccinations. We discussed a need for adhering to healthy diet and exercise. Labs/EKG were reviewed/ordered. All questions were answered. Colon Dr Fuller Plan

## 2015-10-07 ENCOUNTER — Ambulatory Visit (INDEPENDENT_AMBULATORY_CARE_PROVIDER_SITE_OTHER): Payer: 59

## 2015-10-07 DIAGNOSIS — J309 Allergic rhinitis, unspecified: Secondary | ICD-10-CM | POA: Diagnosis not present

## 2015-10-09 ENCOUNTER — Telehealth: Payer: Self-pay | Admitting: Internal Medicine

## 2015-10-09 DIAGNOSIS — J309 Allergic rhinitis, unspecified: Secondary | ICD-10-CM | POA: Diagnosis not present

## 2015-10-09 NOTE — Telephone Encounter (Signed)
Allergy Serum Extract Date Mixed: 09/08/15 Vial: 1 Strength: 1:10 Here/Mail/Pick Up: here Mixed By:  tbs Last OV: 07/23/15 Pending OV: 01/20/16

## 2015-10-13 ENCOUNTER — Other Ambulatory Visit (INDEPENDENT_AMBULATORY_CARE_PROVIDER_SITE_OTHER): Payer: 59

## 2015-10-13 DIAGNOSIS — Z0000 Encounter for general adult medical examination without abnormal findings: Secondary | ICD-10-CM

## 2015-10-13 LAB — CBC WITH DIFFERENTIAL/PLATELET
BASOS PCT: 0.3 % (ref 0.0–3.0)
Basophils Absolute: 0 10*3/uL (ref 0.0–0.1)
EOS ABS: 0.4 10*3/uL (ref 0.0–0.7)
EOS PCT: 5.7 % — AB (ref 0.0–5.0)
HEMATOCRIT: 47.3 % (ref 39.0–52.0)
Hemoglobin: 15.6 g/dL (ref 13.0–17.0)
LYMPHS PCT: 27 % (ref 12.0–46.0)
Lymphs Abs: 1.9 10*3/uL (ref 0.7–4.0)
MCHC: 33.1 g/dL (ref 30.0–36.0)
MCV: 89.2 fl (ref 78.0–100.0)
MONO ABS: 0.5 10*3/uL (ref 0.1–1.0)
Monocytes Relative: 7.4 % (ref 3.0–12.0)
NEUTROS ABS: 4.2 10*3/uL (ref 1.4–7.7)
Neutrophils Relative %: 59.6 % (ref 43.0–77.0)
PLATELETS: 224 10*3/uL (ref 150.0–400.0)
RBC: 5.3 Mil/uL (ref 4.22–5.81)
RDW: 13.4 % (ref 11.5–15.5)
WBC: 7.1 10*3/uL (ref 4.0–10.5)

## 2015-10-13 LAB — LIPID PANEL
CHOL/HDL RATIO: 5
CHOLESTEROL: 174 mg/dL (ref 0–200)
HDL: 36.8 mg/dL — AB (ref >39.00)
LDL Cholesterol: 120 mg/dL — ABNORMAL HIGH (ref 0–99)
NonHDL: 137.24
TRIGLYCERIDES: 84 mg/dL (ref 0.0–149.0)
VLDL: 16.8 mg/dL (ref 0.0–40.0)

## 2015-10-13 LAB — BASIC METABOLIC PANEL
BUN: 26 mg/dL — AB (ref 6–23)
CHLORIDE: 107 meq/L (ref 96–112)
CO2: 30 meq/L (ref 19–32)
CREATININE: 1.15 mg/dL (ref 0.40–1.50)
Calcium: 9.7 mg/dL (ref 8.4–10.5)
GFR: 68.17 mL/min (ref >60.00)
Glucose, Bld: 92 mg/dL (ref 70–99)
POTASSIUM: 4.7 meq/L (ref 3.5–5.1)
Sodium: 143 mEq/L (ref 135–145)

## 2015-10-13 LAB — URINALYSIS
BILIRUBIN URINE: NEGATIVE
HGB URINE DIPSTICK: NEGATIVE
LEUKOCYTES UA: NEGATIVE
NITRITE: NEGATIVE
Specific Gravity, Urine: 1.025 (ref 1.000–1.030)
TOTAL PROTEIN, URINE-UPE24: NEGATIVE
UROBILINOGEN UA: 0.2 (ref 0.0–1.0)
Urine Glucose: NEGATIVE
pH: 6 (ref 5.0–8.0)

## 2015-10-13 LAB — HEPATIC FUNCTION PANEL
ALT: 25 U/L (ref 0–53)
AST: 21 U/L (ref 0–37)
Albumin: 4.3 g/dL (ref 3.5–5.2)
Alkaline Phosphatase: 64 U/L (ref 39–117)
BILIRUBIN DIRECT: 0.1 mg/dL (ref 0.0–0.3)
TOTAL PROTEIN: 6.9 g/dL (ref 6.0–8.3)
Total Bilirubin: 0.5 mg/dL (ref 0.2–1.2)

## 2015-10-13 LAB — TSH: TSH: 0.52 u[IU]/mL (ref 0.35–4.50)

## 2015-10-13 LAB — PSA: PSA: 0.85 ng/mL (ref 0.10–4.00)

## 2015-10-14 LAB — HEPATITIS C ANTIBODY: HCV AB: NEGATIVE

## 2015-10-21 ENCOUNTER — Ambulatory Visit (INDEPENDENT_AMBULATORY_CARE_PROVIDER_SITE_OTHER): Payer: 59

## 2015-10-21 ENCOUNTER — Ambulatory Visit: Payer: 59

## 2015-10-21 DIAGNOSIS — J309 Allergic rhinitis, unspecified: Secondary | ICD-10-CM

## 2015-11-04 ENCOUNTER — Ambulatory Visit (INDEPENDENT_AMBULATORY_CARE_PROVIDER_SITE_OTHER): Payer: 59 | Admitting: *Deleted

## 2015-11-04 DIAGNOSIS — J309 Allergic rhinitis, unspecified: Secondary | ICD-10-CM | POA: Diagnosis not present

## 2015-11-11 ENCOUNTER — Encounter: Payer: Self-pay | Admitting: Gastroenterology

## 2015-11-18 ENCOUNTER — Ambulatory Visit (INDEPENDENT_AMBULATORY_CARE_PROVIDER_SITE_OTHER): Payer: 59 | Admitting: *Deleted

## 2015-11-18 DIAGNOSIS — J309 Allergic rhinitis, unspecified: Secondary | ICD-10-CM

## 2015-12-02 ENCOUNTER — Ambulatory Visit (INDEPENDENT_AMBULATORY_CARE_PROVIDER_SITE_OTHER): Payer: 59

## 2015-12-02 DIAGNOSIS — J309 Allergic rhinitis, unspecified: Secondary | ICD-10-CM

## 2015-12-16 ENCOUNTER — Ambulatory Visit (INDEPENDENT_AMBULATORY_CARE_PROVIDER_SITE_OTHER): Payer: 59 | Admitting: *Deleted

## 2015-12-16 DIAGNOSIS — J309 Allergic rhinitis, unspecified: Secondary | ICD-10-CM | POA: Diagnosis not present

## 2015-12-23 ENCOUNTER — Ambulatory Visit (AMBULATORY_SURGERY_CENTER): Payer: Self-pay

## 2015-12-23 VITALS — Ht 74.0 in | Wt 235.8 lb

## 2015-12-23 DIAGNOSIS — Z860100 Personal history of colon polyps, unspecified: Secondary | ICD-10-CM

## 2015-12-23 MED ORDER — SUPREP BOWEL PREP KIT 17.5-3.13-1.6 GM/177ML PO SOLN: 1.0000 | Freq: Once | ORAL | Status: DC

## 2015-12-23 NOTE — Progress Notes (Signed)
No allergies to eggs or soy No diet/ weight loss meds No past problems with anesthesia No home oxygen  Has email and internet; refused emmi./has had before

## 2015-12-26 ENCOUNTER — Encounter: Payer: Self-pay | Admitting: Internal Medicine

## 2015-12-26 ENCOUNTER — Ambulatory Visit (INDEPENDENT_AMBULATORY_CARE_PROVIDER_SITE_OTHER): Payer: 59 | Admitting: Internal Medicine

## 2015-12-26 VITALS — BP 140/92 | HR 94 | Wt 235.0 lb

## 2015-12-26 DIAGNOSIS — J398 Other specified diseases of upper respiratory tract: Secondary | ICD-10-CM | POA: Diagnosis not present

## 2015-12-26 DIAGNOSIS — J320 Chronic maxillary sinusitis: Secondary | ICD-10-CM

## 2015-12-26 DIAGNOSIS — K219 Gastro-esophageal reflux disease without esophagitis: Secondary | ICD-10-CM

## 2015-12-26 MED ORDER — GLYCOPYRROLATE 1 MG PO TABS: 1.0000 mg | ORAL_TABLET | Freq: Two times a day (BID) | ORAL | Status: DC

## 2015-12-26 MED ORDER — IPRATROPIUM BROMIDE 0.06 % NA SOLN: 2.0000 | Freq: Three times a day (TID) | NASAL | Status: DC

## 2015-12-26 NOTE — Patient Instructions (Signed)
GERD memory foam wedge Body pillow

## 2015-12-26 NOTE — Progress Notes (Signed)
Subjective:  Patient ID: John Boone, male    DOB: 09/15/1950  Age: 65 y.o. MRN: [id number]  CC: No chief complaint on file.   HPI John Boone presents for a lot of mucus in the throat during the night - pt has to get up several times to clear his throat (stated in December 2 years ago), choking. No OSA. Sinus surgery, allergy Rx and GERD Rx did not help.  Outpatient Prescriptions Prior to Visit  Medication Sig Dispense Refill  . albuterol (PROVENTIL HFA) 108 (90 BASE) MCG/ACT inhaler Inhale 2 puffs into the lungs 4 (four) times daily as needed for shortness of breath. 1 Inhaler 6  . aspirin 325 MG tablet Take 325 mg by mouth daily.      . Cholecalciferol 1000 UNITS tablet Take 1,000 Units by mouth daily.      Marland Kitchen doxycycline (VIBRA-TABS) 100 MG tablet Take 1 tablet (100 mg total) by mouth 2 (two) times daily. 60 tablet 5  . metoprolol succinate (TOPROL-XL) 25 MG 24 hr tablet Take 1 tablet (25 mg total) by mouth 2 (two) times daily. 60 tablet 11  . NON FORMULARY Inject into the skin. Allergy Shots 1:10 GH     . NONFORMULARY OR COMPOUNDED ITEM Allergy Vaccine 1:10 Given at [facility]    . pantoprazole (PROTONIX) 40 MG tablet Take 1 tablet (40 mg total) by mouth daily. 30 tablet 3  . SUPREP BOWEL PREP SOLN Take 1 kit by mouth once. 354 mL 0  . azelastine (ASTELIN) 0.1 % nasal spray Reported on 12/26/2015  11  . fluticasone (FLONASE) 50 MCG/ACT nasal spray Reported on 12/26/2015  11  . montelukast (SINGULAIR) 10 MG tablet TAKE 1 TABLET BY MOUTH AT BEDTIME (Patient not taking: Reported on 12/26/2015) 30 tablet 5   No facility-administered medications prior to visit.    ROS Review of Systems  Constitutional: Negative for appetite change, fatigue and unexpected weight change.  HENT: Positive for congestion, postnasal drip and rhinorrhea. Negative for nosebleeds, sneezing, sore throat and trouble swallowing.   Eyes: Negative for itching and visual disturbance.  Respiratory: Negative  for cough.   Cardiovascular: Negative for chest pain, palpitations and leg swelling.  Gastrointestinal: Negative for nausea, diarrhea, blood in stool and abdominal distention.  Genitourinary: Negative for frequency and hematuria.  Musculoskeletal: Negative for back pain, joint swelling, gait problem and neck pain.  Skin: Negative for rash.  Neurological: Negative for dizziness, tremors, speech difficulty and weakness.  Psychiatric/Behavioral: Positive for sleep disturbance. Negative for suicidal ideas, dysphoric mood and agitation. The patient is not nervous/anxious.     Objective:  BP 140/92 mmHg  Pulse 94  Wt 235 lb (106.595 kg)  SpO2 98%  BP Readings from Last 3 Encounters:  12/26/15 140/92  09/29/15 140/92  07/23/15 138/80    Wt Readings from Last 3 Encounters:  12/26/15 235 lb (106.595 kg)  12/23/15 235 lb 12.8 oz (106.958 kg)  09/29/15 237 lb 4 oz (107.616 kg)    Physical Exam  Constitutional: He is oriented to person, place, and time. He appears well-developed. No distress.  NAD  HENT:  Mouth/Throat: Oropharynx is clear and moist.  Eyes: Conjunctivae are normal. Pupils are equal, round, and reactive to light.  Neck: Normal range of motion. No JVD present. No thyromegaly present.  Cardiovascular: Normal rate, regular rhythm, normal heart sounds and intact distal pulses.  Exam reveals no gallop and no friction rub.   No murmur heard. Pulmonary/Chest: Effort normal and breath  sounds normal. No respiratory distress. He has no wheezes. He has no rales. He exhibits no tenderness.  Abdominal: Soft. Bowel sounds are normal. He exhibits no distension and no mass. There is no tenderness. There is no rebound and no guarding.  Musculoskeletal: Normal range of motion. He exhibits no edema or tenderness.  Lymphadenopathy:    He has no cervical adenopathy.  Neurological: He is alert and oriented to person, place, and time. He has normal reflexes. No cranial nerve deficit. He  exhibits normal muscle tone. He displays a negative Romberg sign. Coordination and gait normal.  Skin: Skin is warm and dry. No rash noted.  Psychiatric: He has a normal mood and affect. His behavior is normal. Judgment and thought content normal.  swollen nasal mucosa  Lab Results  Component Value Date   WBC 7.1 10/13/2015   HGB 15.6 10/13/2015   HCT 47.3 10/13/2015   PLT 224.0 10/13/2015   GLUCOSE 92 10/13/2015   CHOL 174 10/13/2015   TRIG 84.0 10/13/2015   HDL 36.80* 10/13/2015   LDLCALC 120* 10/13/2015   ALT 25 10/13/2015   AST 21 10/13/2015   NA 143 10/13/2015   K 4.7 10/13/2015   CL 107 10/13/2015   CREATININE 1.15 10/13/2015   BUN 26* 10/13/2015   CO2 30 10/13/2015   TSH 0.52 10/13/2015   PSA 0.85 10/13/2015    Ct Maxillofacial Ltd Wo Cm  11/08/2014  CLINICAL DATA:  65 year old male with chronic sinus drainage, cough and sinus pressure for the past year. Five rounds of antibiotic therapy with no relief of symptoms. EXAM: CT PARANASAL SINUS LIMITED WITHOUT CONTRAST TECHNIQUE: Non-contiguous multidetector CT images of the paranasal sinuses were obtained in a single plane without contrast. COMPARISON:  No priors. FINDINGS: Multifocal mucosal thickening in the paranasal sinuses, most evident throughout the ethmoid sinuses, and to a lesser extent in the maxillary and frontal sinuses bilaterally. In the medial aspect of the right frontal sinus there is a polypoid lesion, which may represent a mucosal retention cyst or polyp, which measures approximately 1.8 x 1.2 cm. Notably, there is also a small 1.1 x 0.8 cm partially ossified lesion in the medial aspect of the right frontal sinus (image 13 of series 5), which could represent a very small osteoma. No significant nasal septal deviation. No air-fluid levels. IMPRESSION: 1. Multifocal paranasal sinus disease, as above, without air-fluid levels to suggest an acute sinusitis at this time. 2. Small mucosal retention cyst or polyp in the  medial aspect of the right frontal sinus. 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 Electronically Signed   By: Vinnie Langton M.D.   On: 11/08/2014 16:06    Assessment & Plan:   There are no diagnoses linked to this encounter. I have discontinued Mr. Maffeo montelukast. I am also having him start on ipratropium and glycopyrrolate. Additionally, I am having him maintain his NON FORMULARY, aspirin, Cholecalciferol, albuterol, azelastine, fluticasone, metoprolol succinate, NONFORMULARY OR COMPOUNDED ITEM, pantoprazole, doxycycline, and SUPREP BOWEL PREP.  Meds ordered this encounter  Medications  . ipratropium (ATROVENT) 0.06 % nasal spray    Sig: Place 2 sprays into the nose 3 (three) times daily.    Dispense:  15 mL    Refill:  3  . glycopyrrolate (ROBINUL) 1 MG tablet    Sig: Take 1-2 tablets (1-2 mg total) by mouth 2 (two) times daily.    Dispense:  100 tablet    Refill:  3     Follow-up: Return in about 4 months (around 04/26/2016) for a follow-up visit.  Walker Kehr, MD

## 2015-12-26 NOTE — Progress Notes (Signed)
Pre visit review using our clinic review tool, if applicable. No additional management support is needed unless otherwise documented below in the visit note. 

## 2015-12-26 NOTE — Assessment & Plan Note (Signed)
Protonix Singulair, Dymista - d/c 4/17 GERD memory foam wedge Body pillow; Atrovent, Robinul

## 2015-12-26 NOTE — Assessment & Plan Note (Addendum)
Singulair, Dymista - d/c 4/17 GERD memory foam wedge Body pillow; Atrovent, Robinul

## 2015-12-26 NOTE — Assessment & Plan Note (Addendum)
2015 a lot of mucus in the throat during the night - pt has to get up several times to clear his throat (stated in December 2 years ago), choking. No OSA. Sinus surgery, allergy Rx and GERD Rx did not help. Sinus surgery did not help Respiratory vs GERD related 1/17 Protonix 4/17 Singulair -d/c; Dymista - d/c  GERD memory foam wedge Body pillow; Atrovent, Robinul

## 2015-12-30 ENCOUNTER — Ambulatory Visit (INDEPENDENT_AMBULATORY_CARE_PROVIDER_SITE_OTHER): Payer: 59 | Admitting: *Deleted

## 2015-12-30 DIAGNOSIS — J309 Allergic rhinitis, unspecified: Secondary | ICD-10-CM

## 2016-01-06 ENCOUNTER — Ambulatory Visit (AMBULATORY_SURGERY_CENTER): Payer: 59 | Admitting: Gastroenterology

## 2016-01-06 ENCOUNTER — Encounter: Payer: Self-pay | Admitting: Gastroenterology

## 2016-01-06 VITALS — BP 118/78 | HR 66 | Temp 98.9°F | Resp 18 | Ht 74.0 in | Wt 235.0 lb

## 2016-01-06 DIAGNOSIS — D123 Benign neoplasm of transverse colon: Secondary | ICD-10-CM | POA: Diagnosis not present

## 2016-01-06 DIAGNOSIS — D122 Benign neoplasm of ascending colon: Secondary | ICD-10-CM | POA: Diagnosis not present

## 2016-01-06 DIAGNOSIS — K621 Rectal polyp: Secondary | ICD-10-CM | POA: Diagnosis not present

## 2016-01-06 DIAGNOSIS — Z1211 Encounter for screening for malignant neoplasm of colon: Secondary | ICD-10-CM

## 2016-01-06 DIAGNOSIS — D128 Benign neoplasm of rectum: Secondary | ICD-10-CM

## 2016-01-06 DIAGNOSIS — D129 Benign neoplasm of anus and anal canal: Secondary | ICD-10-CM

## 2016-01-06 MED ORDER — SODIUM CHLORIDE 0.9 % IV SOLN: 500.0000 mL | INTRAVENOUS | Status: DC

## 2016-01-06 NOTE — Patient Instructions (Signed)
YOU HAD AN ENDOSCOPIC PROCEDURE TODAY AT [facility]:   Refer to the procedure report that was given to you for any specific questions about what was found during the examination.  If the procedure report does not answer your questions, please call your gastroenterologist to clarify.  If you requested that your care partner not be given the details of your procedure findings, then the procedure report has been included in a sealed envelope for you to review at your convenience later.  YOU SHOULD EXPECT: Some feelings of bloating in the abdomen. Passage of more gas than usual.  Walking can help get rid of the air that was put into your GI tract during the procedure and reduce the bloating. If you had a lower endoscopy (such as a colonoscopy or flexible sigmoidoscopy) you may notice spotting of blood in your stool or on the toilet paper. If you underwent a bowel prep for your procedure, you may not have a normal bowel movement for a few days.  Please Note:  You might notice some irritation and congestion in your nose or some drainage.  This is from the oxygen used during your procedure.  There is no need for concern and it should clear up in a day or so.  SYMPTOMS TO REPORT IMMEDIATELY:   Following lower endoscopy (colonoscopy or flexible sigmoidoscopy):  Excessive amounts of blood in the stool  Significant tenderness or worsening of abdominal pains  Swelling of the abdomen that is new, acute  Fever of 100F or higher   For urgent or emergent issues, a gastroenterologist can be reached at any hour by calling [phone].   DIET: Your first meal following the procedure should be a small meal and then it is ok to progress to your normal diet. Heavy or fried foods are harder to digest and may make you feel nauseous or bloated.  Likewise, meals heavy in dairy and vegetables can increase bloating.  Drink plenty of fluids but you should avoid alcoholic beverages for 24  hours.  ACTIVITY:  You should plan to take it easy for the rest of today and you should NOT DRIVE or use heavy machinery until tomorrow (because of the sedation medicines used during the test).    FOLLOW UP: Our staff will call the number listed on your records the next business day following your procedure to check on you and address any questions or concerns that you may have regarding the information given to you following your procedure. If we do not reach you, we will leave a message.  However, if you are feeling well and you are not experiencing any problems, there is no need to return our call.  We will assume that you have returned to your regular daily activities without incident.  If any biopsies were taken you will be contacted by phone or by letter within the next 1-3 weeks.  Please call us at [phone] if you have not heard about the biopsies in 3 weeks.    SIGNATURES/CONFIDENTIALITY: You and/or your care partner have signed paperwork which will be entered into your electronic medical record.  These signatures attest to the fact that that the information above on your After Visit Summary has been reviewed and is understood.  Full responsibility of the confidentiality of this discharge information lies with you and/or your care-partner.  You will need another colonoscopy in 3-5 years.  Thank-you for choosing [place] for your healthcare needs today.

## 2016-01-06 NOTE — Progress Notes (Signed)
Called to room to assist during endoscopic procedure.  Patient ID and intended procedure confirmed with present staff. Received instructions for my participation in the procedure from the performing physician.  

## 2016-01-06 NOTE — Op Note (Signed)
[place] Patient Name: John Boone Procedure Date: 01/06/2016 10:32 AM MRN: [id number] Endoscopist: Ladene Artist , MD Age: 65 Date of Birth: 1951/08/08 Gender: Male Procedure:                Colonoscopy Indications:              Screening for colorectal malignant neoplasm Medicines:                Monitored Anesthesia Care Procedure:                Pre-Anesthesia Assessment:                           - Prior to the procedure, a History and Physical                            was performed, and patient medications and                            allergies were reviewed. The patient's tolerance of                            previous anesthesia was also reviewed. The risks                            and benefits of the procedure and the sedation                            options and risks were discussed with the patient.                            All questions were answered, and informed consent                            was obtained. Prior Anticoagulants: The patient has                            taken no previous anticoagulant or antiplatelet                            agents. ASA Grade Assessment: II - A patient with                            mild systemic disease. After reviewing the risks                            and benefits, the patient was deemed in                            satisfactory condition to undergo the procedure.                           After obtaining informed consent, the colonoscope  was passed under direct vision. Throughout the                            procedure, the patient's blood pressure, pulse, and                            oxygen saturations were monitored continuously. The                            Model PCF-H190L [id number]) scope was introduced                            through the anus and advanced to the the cecum,                            identified by appendiceal orifice and ileocecal                  valve. The colonoscopy was performed without                            difficulty. The patient tolerated the procedure                            well. The quality of the bowel preparation was                            adequate. The ileocecal valve, appendiceal orifice,                            and rectum were photographed. Scope In: 10:36:28 AM Scope Out: [id number] AM Scope Withdrawal Time: 0 hours 18 minutes 11 seconds  Total Procedure Duration: 0 hours 20 minutes 28 seconds  Findings:                 The digital rectal exam was normal.                           Five sessile polyps were found in the rectum,                            transverse colon and ascending colon. The polyps                            were 6 to 7 mm in size. These polyps were removed                            with a cold snare. Resection and retrieval were                            complete.                           A 4 mm polyp was found in the ascending colon. The  polyp was sessile. The polyp was removed with a                            cold biopsy forceps. Resection and retrieval were                            complete.                           The exam was otherwise normal throughout the                            examined colon.                           The retroflexed view of the distal rectum and anal                            verge was normal and showed no anal or rectal                            abnormalities. Complications:            No immediate complications. Estimated Blood Loss:     Estimated blood loss: none. Impression:               - Five 6 to 7 mm polyps in the rectum (3), in the                            transverse colon (1) and in the ascending colon                            (1), removed with a cold snare. Resected and                            retrieved.                           - One 4 mm polyp in the ascending colon, removed                             with a cold biopsy forceps. Resected and retrieved. Recommendation:           - Patient has a contact number available for                            emergencies. The signs and symptoms of potential                            delayed complications were discussed with the                            patient. Return to normal activities tomorrow.  Written discharge instructions were provided to the                            patient.                           - Resume previous diet.                           - Continue present medications.                           - Await pathology results.                           - Repeat colonoscopy in 3-5 years for surveillance                            if polyps precancerous, otherwise 10 years. Ladene Artist, MD 01/06/2016 11:03:46 AM This report has been signed electronically.

## 2016-01-06 NOTE — Progress Notes (Signed)
TO PACU  Awake and alert  Report to RN 

## 2016-01-07 ENCOUNTER — Telehealth: Payer: Self-pay | Admitting: *Deleted

## 2016-01-07 NOTE — Telephone Encounter (Signed)
  Follow up Call-  Call back number 01/06/2016  Post procedure Call Back phone  # [phone]  Permission to leave phone message Yes     Patient questions:  Do you have a fever, pain , or abdominal swelling? No. Pain Score  0 *  Have you tolerated food without any problems? Yes.    Have you been able to return to your normal activities? Yes.    Do you have any questions about your discharge instructions: Diet   No. Medications  No. Follow up visit  No.  Do you have questions or concerns about your Care? No.  Actions: * If pain score is 4 or above: No action needed, pain <4.

## 2016-01-13 ENCOUNTER — Ambulatory Visit (INDEPENDENT_AMBULATORY_CARE_PROVIDER_SITE_OTHER): Payer: 59 | Admitting: *Deleted

## 2016-01-13 DIAGNOSIS — J309 Allergic rhinitis, unspecified: Secondary | ICD-10-CM

## 2016-01-15 ENCOUNTER — Encounter: Payer: Self-pay | Admitting: Internal Medicine

## 2016-01-16 ENCOUNTER — Encounter: Payer: Self-pay | Admitting: Gastroenterology

## 2016-01-20 ENCOUNTER — Encounter: Payer: Self-pay | Admitting: Internal Medicine

## 2016-01-20 ENCOUNTER — Ambulatory Visit (INDEPENDENT_AMBULATORY_CARE_PROVIDER_SITE_OTHER): Payer: 59 | Admitting: Internal Medicine

## 2016-01-20 VITALS — BP 132/82 | HR 72 | Ht 74.0 in | Wt 237.4 lb

## 2016-01-20 DIAGNOSIS — J302 Other seasonal allergic rhinitis: Secondary | ICD-10-CM

## 2016-01-20 DIAGNOSIS — J3089 Other allergic rhinitis: Secondary | ICD-10-CM

## 2016-01-20 MED ORDER — VERAPAMIL HCL 80 MG PO TABS: 80.0000 mg | ORAL_TABLET | Freq: Three times a day (TID) | ORAL | Status: DC

## 2016-01-20 NOTE — Patient Instructions (Signed)
Script Verapamil to see if it helps the sinus disease and drainage as discussed

## 2016-01-20 NOTE — Progress Notes (Signed)
09/14/11- 60 yoM former smoker, followed for asthma, allergic rhinitis, complicated by AFib/PAT,  LOV-09/15/10 At work he is exposed to secondhand smoke which makes him sneeze. He has done well with maintenance allergy vaccine without need to change. In the last few weeks has resumed loratadine and Sudafed, questioning mold from wet ground. No asthma and a very rare need for rescue inhaler.  06/14/12- 60 yoM former smoker, followed for asthma, allergic rhinitis, complicated by AFib/PAT, Still on allergy vaccine 1:10 [facility] and doing well; states still has some stuffiness at times but better since last visit. On a long bike ride one week ago, he inhaled ? an insect. He began coughing immediately and still gets some persistent cough with clear mucus. Rescue inhaler has been some help.  06/14/13- 62 yoM former smoker, followed for asthma, allergic rhinitis, complicated by AFib/PAT, Still on allergy vaccine 1:10 [facility] FOLLOWS FOR: still on vaccine and doing well; denies any flare ups at this time. Easier DOE riding bike. No cough/ wheeze/ CP. Myocardial perfusion 07/26/12- EF 62% Allergy vaccine doing well. No hx of asbestos exposure- unexplained pleural plaques. CT chest 06/15/12 IMPRESSION:  1. The plain film abnormalities were likely secondary to partially  calcified pleural plaques bilaterally; indicative of asbestos  related pleural disease. No pulmonary nodules.  2. Mild centrilobular emphysema, without acute superimposed  process in the chest.  3. Esophageal air fluid level suggests dysmotility or  gastroesophageal reflux.  4. Multivessel coronary artery atherosclerosis.  Original Report Authenticated By: Areta Haber, M.D.  10/31/13- 25 yoM former smoker, followed for asthma, allergic rhinitis, pleural plaques(unexplained), complicated by AFib/PAT/ CAD, ACUTE VISIT: lots of sinus drainage in am-thick. Chest congestion("feels like bronchitis"). SOB as well. Was given Amoxicllin last week and no  help. Has been using Sudafed during the day-helps open sinus area Still on allergy vaccine 1:10 [facility] Nasal congestion with thick white since a cold in December. Early transient headache. No fever. Recently some cough. Using saline rinse, sudafed. Claritin x 2 weeks had little effect.Day 5/7 Augmentin. He again denies any knowledge of any previous asbestos exposure. CXR 06/11/13 IMPRESSION:  No acute finding. Calcified pleural plaques.  Electronically Signed  By: Inge Rise M.D.  On: 06/14/2013 16:21   05/01/2014 Acute OV   25 yoM former smoker, followed for asthma, allergic rhinitis, pleural plaques(unexplained), complicated by AFib/PAT/ CAD Complains of  throat congestion with clear/white mucus, head congestion w/ thick clear drainage, some clear/bloody nasal drainage, PND, sinus pressure. Sore throat worse at night. Taking Allegra without much help. Mucus is mainly clear.  Since last visit - doesn't feel the Nasonex given at last ov helps. Says he has on/off symptoms for last several months , worse for last few weeks.  2 cats in home and carpet.  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  He denies any fever, or hemoptysis, orthopnea, PND, leg swelling, abdominal pain, nausea, vomiting, or rash. Reports he has previously been on Singulair in the past and felt that it did help with his allergy symptoms Previously on allergy vaccine years ago.   06/18/14- 44 yoM former smoker/ 30 pk yrs, followed for asthma, allergic rhinitis, pleural plaques(unexplained), complicated by AFib/PAT/ CAD, FOLLOWS FOR: Allergies patient having throat drainage more at night, nasal drainage is better from last visit. Sinus pressure has alleviated,patient back on Nasonex.  CXR 11/12/13 FINDINGS:  The heart size and mediastinal contours are within normal limits.  Stable bilateral calcified pleural plaques compared to prior exam.  No  pneumothorax or pleural effusion is seen. No acute  pulmonary  disease is noted. The visualized skeletal structures are  unremarkable.  IMPRESSION:  No acute cardiopulmonary abnormality seen.  Electronically Signed  By: Sabino Dick M.D.  On: 11/12/2013 18:06  01/20/15- 80 yoM former smoker/ 30 pk yrs, followed for asthma, allergic rhinitis, pleural plaques(unexplained), complicated by AFib/PAT/ CAD, ACUTE VISIT: had sinus surgery/ nasal polypectomy about 2 weeks through [facility]; pt is currently have chest congestion with cough in am-clear colored phlegm-unsure of PND; hard to talk due to cough and lungs feeling tight. Wheezing as well. Not allergic to aspirin  Pt is still on Allergy  Vaccine 1:10 [place] He had a retinal bleed while on Advair so he thinks there is a connection.. CXR 07/31/14 IMPRESSION: No acute cardiopulmonary findings. 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 Electronically Signed  By: Kalman Jewels M.D.  On: 07/31/2014 15:41  07/23/2015-65 year old male former smoker/30 pack years, followed for asthma, allergic rhinitis, pleural plaques (unexplained), chronic sinusitis complicated by AFib/SVT/CAD, rosacea Allergy Vaccine 1:10 [facility] Follows For: pt states he is doing well. pt receiving allery vaccine once a week, and working well for pt. pt states his bretahing has been good. no c/o SOB, wheezing,cough or chest tightness. no concerns at this time.  CT MaxFac 11/08/14 IMPRESSION: 1. Multifocal paranasal sinus disease, as above, without air-fluid levels to suggest an acute sinusitis at this time. 2. Small mucosal retention cyst or polyp in the medial aspect of the right frontal sinus. 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 Electronically Signed  By: Vinnie Langton M.D.  On: 11/08/2014 16:06  01/19/2016-65 year old male former smoker/30 pack years, followed for asthma, allergic rhinitis, pleural plaques  (unexplained), chronic sinusitis complicated by A. fib/SVT/CAD, rosacea Allergy Vaccine 1:10 [facility]    ROS-see HPI Constitutional:   No-   weight loss, night sweats, fevers, chills, fatigue, lassitude. HEENT:   No headaches, difficulty swallowing, tooth/dental problems, +sore throat,       No-  sneezing, itching, ear ache,   + nasal congestion, post nasal drip,  CV:  No-   chest pain, orthopnea, PND, swelling in lower extremities, anasarca,  dizziness, palpitations Resp: +shortness of breath with exertion or at rest.              + productive cough,  + non-productive cough,  No- coughing up of blood.              No-   change in color of mucus.  No- wheezing.   Skin: No-   rash or lesions. GI:  No-   heartburn, indigestion, abdominal pain, nausea, GU: MS:  No-   joint pain or swelling.   Neuro-     nothing unusual Psych:  No- change in mood or affect. No depression or anxiety.  No memory loss.  OBJ General- Alert, Oriented, Affect-appropriate, Distress- none acute Skin- rash-none, lesions- none, excoriation- none. +Chronic rhinophyma appearance Lymphadenopathy- none Head- atraumatic            Eyes- Gross vision intact, PERRLA, conjunctivae clear secretions            Ears- Hearing, canals-normal            Nose- + sl nasal turbinate edema, no-Septal dev, mucus, polyps, erosion, perforation             Throat- Mallampati II , mucosa clear , drainage- none, tonsils- atrophic Neck- flexible ,  trachea midline, no stridor , thyroid nl, carotid no bruit Chest - symmetrical excursion , unlabored           Heart/CV- RRR- rate controlled , no murmur , no gallop  , no rub, nl s1 s2                           - JVD- none , edema- none, stasis changes- none, varices- none           Lung- clear, wheeze- none, cough- none , dullness-none, rub- none           Chest wall-  Abd- Extrem- cyanosis- none, clubbing, none, atrophy- none, strength- nl Neuro- grossly intact to observation

## 2016-01-27 ENCOUNTER — Ambulatory Visit (INDEPENDENT_AMBULATORY_CARE_PROVIDER_SITE_OTHER): Payer: 59 | Admitting: *Deleted

## 2016-01-27 ENCOUNTER — Other Ambulatory Visit: Payer: Self-pay | Admitting: Internal Medicine

## 2016-01-27 DIAGNOSIS — J309 Allergic rhinitis, unspecified: Secondary | ICD-10-CM | POA: Diagnosis not present

## 2016-02-03 ENCOUNTER — Ambulatory Visit (INDEPENDENT_AMBULATORY_CARE_PROVIDER_SITE_OTHER): Payer: 59 | Admitting: *Deleted

## 2016-02-03 DIAGNOSIS — J309 Allergic rhinitis, unspecified: Secondary | ICD-10-CM

## 2016-02-10 ENCOUNTER — Ambulatory Visit (INDEPENDENT_AMBULATORY_CARE_PROVIDER_SITE_OTHER): Payer: 59 | Admitting: *Deleted

## 2016-02-10 DIAGNOSIS — J309 Allergic rhinitis, unspecified: Secondary | ICD-10-CM | POA: Diagnosis not present

## 2016-02-17 ENCOUNTER — Ambulatory Visit (INDEPENDENT_AMBULATORY_CARE_PROVIDER_SITE_OTHER): Payer: 59 | Admitting: *Deleted

## 2016-02-17 DIAGNOSIS — J309 Allergic rhinitis, unspecified: Secondary | ICD-10-CM | POA: Diagnosis not present

## 2016-02-24 ENCOUNTER — Ambulatory Visit (INDEPENDENT_AMBULATORY_CARE_PROVIDER_SITE_OTHER): Payer: 59 | Admitting: *Deleted

## 2016-02-24 DIAGNOSIS — J309 Allergic rhinitis, unspecified: Secondary | ICD-10-CM | POA: Diagnosis not present

## 2016-03-03 ENCOUNTER — Ambulatory Visit (INDEPENDENT_AMBULATORY_CARE_PROVIDER_SITE_OTHER): Payer: 59 | Admitting: *Deleted

## 2016-03-03 DIAGNOSIS — J309 Allergic rhinitis, unspecified: Secondary | ICD-10-CM | POA: Diagnosis not present

## 2016-03-08 ENCOUNTER — Ambulatory Visit (INDEPENDENT_AMBULATORY_CARE_PROVIDER_SITE_OTHER): Payer: 59

## 2016-03-08 DIAGNOSIS — J309 Allergic rhinitis, unspecified: Secondary | ICD-10-CM | POA: Diagnosis not present

## 2016-03-16 ENCOUNTER — Ambulatory Visit (INDEPENDENT_AMBULATORY_CARE_PROVIDER_SITE_OTHER): Payer: 59 | Admitting: *Deleted

## 2016-03-16 DIAGNOSIS — J309 Allergic rhinitis, unspecified: Secondary | ICD-10-CM | POA: Diagnosis not present

## 2016-03-23 ENCOUNTER — Ambulatory Visit (INDEPENDENT_AMBULATORY_CARE_PROVIDER_SITE_OTHER): Payer: 59 | Admitting: *Deleted

## 2016-03-23 DIAGNOSIS — J309 Allergic rhinitis, unspecified: Secondary | ICD-10-CM

## 2016-03-28 ENCOUNTER — Other Ambulatory Visit: Payer: Self-pay | Admitting: Internal Medicine

## 2016-03-30 ENCOUNTER — Ambulatory Visit (INDEPENDENT_AMBULATORY_CARE_PROVIDER_SITE_OTHER): Payer: 59 | Admitting: *Deleted

## 2016-03-30 DIAGNOSIS — J309 Allergic rhinitis, unspecified: Secondary | ICD-10-CM

## 2016-03-31 ENCOUNTER — Telehealth: Payer: Self-pay | Admitting: Internal Medicine

## 2016-03-31 DIAGNOSIS — J309 Allergic rhinitis, unspecified: Secondary | ICD-10-CM

## 2016-03-31 NOTE — Telephone Encounter (Signed)
Allergy Serum Extract Date Mixed: 03/31/16 Vial: 1 Strength: 1:10 Here/Mail/Pick Up: here Mixed By: tbs Last OV: 01/20/16 Pending OV: 04/22/16

## 2016-04-06 ENCOUNTER — Ambulatory Visit (INDEPENDENT_AMBULATORY_CARE_PROVIDER_SITE_OTHER): Payer: 59 | Admitting: *Deleted

## 2016-04-06 DIAGNOSIS — J309 Allergic rhinitis, unspecified: Secondary | ICD-10-CM

## 2016-04-13 ENCOUNTER — Ambulatory Visit (INDEPENDENT_AMBULATORY_CARE_PROVIDER_SITE_OTHER): Payer: 59 | Admitting: *Deleted

## 2016-04-13 DIAGNOSIS — J309 Allergic rhinitis, unspecified: Secondary | ICD-10-CM | POA: Diagnosis not present

## 2016-04-20 ENCOUNTER — Ambulatory Visit (INDEPENDENT_AMBULATORY_CARE_PROVIDER_SITE_OTHER): Payer: 59 | Admitting: *Deleted

## 2016-04-20 DIAGNOSIS — J309 Allergic rhinitis, unspecified: Secondary | ICD-10-CM | POA: Diagnosis not present

## 2016-04-22 ENCOUNTER — Encounter: Payer: Self-pay | Admitting: Internal Medicine

## 2016-04-22 ENCOUNTER — Ambulatory Visit (INDEPENDENT_AMBULATORY_CARE_PROVIDER_SITE_OTHER): Payer: 59 | Admitting: Internal Medicine

## 2016-04-22 DIAGNOSIS — K219 Gastro-esophageal reflux disease without esophagitis: Secondary | ICD-10-CM

## 2016-04-22 DIAGNOSIS — J4520 Mild intermittent asthma, uncomplicated: Secondary | ICD-10-CM

## 2016-04-22 DIAGNOSIS — J301 Allergic rhinitis due to pollen: Secondary | ICD-10-CM | POA: Diagnosis not present

## 2016-04-22 MED ORDER — AZELASTINE HCL 0.1 % NA SOLN: NASAL | 11 refills | Status: DC

## 2016-04-22 MED ORDER — FLUTICASONE PROPIONATE 50 MCG/ACT NA SUSP: NASAL | 11 refills | Status: DC

## 2016-04-22 NOTE — Patient Instructions (Addendum)
As discussed, we can continue allergy shots until you run out of your current vaccine supply, then stop.  If you continue to need allergy help, then suggest you see one of the other allergy practices in the area.  We did discuss the possibility of trying Nucala injection therapy for chronic allergic sinus disease with asthma Refill scripts for flonase and astelin sent  Please call as needed

## 2016-04-22 NOTE — Assessment & Plan Note (Addendum)
CT chest had shown esophageal air fluid level consistent with dysmotility which may explain his sensation that he needs to get up and clear his throat frequently, despite sleeping with head of bed elevated.

## 2016-04-22 NOTE — Progress Notes (Signed)
09/14/11- 60 yoM former smoker, followed for asthma, allergic rhinitis, complicated by AFib/PAT,  LOV-09/15/10 At work he is exposed to secondhand smoke which makes him sneeze. He has done well with maintenance allergy vaccine without need to change. In the last few weeks has resumed loratadine and Sudafed, questioning mold from wet ground. No asthma and a very rare need for rescue inhaler.  06/14/12- 60 yoM former smoker, followed for asthma, allergic rhinitis, complicated by AFib/PAT, Still on allergy vaccine 1:10 [facility] and doing well; states still has some stuffiness at times but better since last visit. On a long bike ride one week ago, he inhaled ? an insect. He began coughing immediately and still gets some persistent cough with clear mucus. Rescue inhaler has been some help.  06/14/13- 62 yoM former smoker, followed for asthma, allergic rhinitis, complicated by AFib/PAT, Still on allergy vaccine 1:10 [facility] FOLLOWS FOR: still on vaccine and doing well; denies any flare ups at this time. Easier DOE riding bike. No cough/ wheeze/ CP. Myocardial perfusion 07/26/12- EF 62% Allergy vaccine doing well. No hx of asbestos exposure- unexplained pleural plaques. CT chest 06/15/12 IMPRESSION:  1. The plain film abnormalities were likely secondary to partially  calcified pleural plaques bilaterally; indicative of asbestos  related pleural disease. No pulmonary nodules.  2. Mild centrilobular emphysema, without acute superimposed  process in the chest.  3. Esophageal air fluid level suggests dysmotility or  gastroesophageal reflux.  4. Multivessel coronary artery atherosclerosis.  Original Report Authenticated By: Areta Haber, M.D.  10/31/13- 25 yoM former smoker, followed for asthma, allergic rhinitis, pleural plaques(unexplained), complicated by AFib/PAT/ CAD, ACUTE VISIT: lots of sinus drainage in am-thick. Chest congestion("feels like bronchitis"). SOB as well. Was given Amoxicllin last week and no  help. Has been using Sudafed during the day-helps open sinus area Still on allergy vaccine 1:10 [facility] Nasal congestion with thick white since a cold in December. Early transient headache. No fever. Recently some cough. Using saline rinse, sudafed. Claritin x 2 weeks had little effect.Day 5/7 Augmentin. He again denies any knowledge of any previous asbestos exposure. CXR 06/11/13 IMPRESSION:  No acute finding. Calcified pleural plaques.  Electronically Signed  By: Inge Rise M.D.  On: 06/14/2013 16:21   05/01/2014 Acute OV   25 yoM former smoker, followed for asthma, allergic rhinitis, pleural plaques(unexplained), complicated by AFib/PAT/ CAD Complains of  throat congestion with clear/white mucus, head congestion w/ thick clear drainage, some clear/bloody nasal drainage, PND, sinus pressure. Sore throat worse at night. Taking Allegra without much help. Mucus is mainly clear.  Since last visit - doesn't feel the Nasonex given at last ov helps. Says he has on/off symptoms for last several months , worse for last few weeks.  2 cats in home and carpet.  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  He denies any fever, or hemoptysis, orthopnea, PND, leg swelling, abdominal pain, nausea, vomiting, or rash. Reports he has previously been on Singulair in the past and felt that it did help with his allergy symptoms Previously on allergy vaccine years ago.   06/18/14- 44 yoM former smoker/ 30 pk yrs, followed for asthma, allergic rhinitis, pleural plaques(unexplained), complicated by AFib/PAT/ CAD, FOLLOWS FOR: Allergies patient having throat drainage more at night, nasal drainage is better from last visit. Sinus pressure has alleviated,patient back on Nasonex.  CXR 11/12/13 FINDINGS:  The heart size and mediastinal contours are within normal limits.  Stable bilateral calcified pleural plaques compared to prior exam.  No  pneumothorax or pleural effusion is seen. No acute  pulmonary  disease is noted. The visualized skeletal structures are  unremarkable.  IMPRESSION:  No acute cardiopulmonary abnormality seen.  Electronically Signed  By: Sabino Dick M.D.  On: 11/12/2013 18:06  01/20/15- 28 yoM former smoker/ 30 pk yrs, followed for asthma, allergic rhinitis, pleural plaques(unexplained), complicated by AFib/PAT/ CAD, ACUTE VISIT: had sinus surgery/ nasal polypectomy about 2 weeks through [facility]; pt is currently have chest congestion with cough in am-clear colored phlegm-unsure of PND; hard to talk due to cough and lungs feeling tight. Wheezing as well. Not allergic to aspirin  Pt is still on Allergy  Vaccine 1:10 [place] He had a retinal bleed while on Advair so he thinks there is a connection.. CXR 07/31/14 IMPRESSION: No acute cardiopulmonary findings. 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 Electronically Signed  By: Kalman Jewels M.D.  On: 07/31/2014 15:41  07/23/2015-65 year old male former smoker/30 pack years, followed for asthma, allergic rhinitis, pleural plaques (unexplained), chronic sinusitis complicated by AFib/SVT/CAD, rosacea Allergy Vaccine 1:10 [facility] Follows For: pt states he is doing well. pt receiving allery vaccine once a week, and working well for pt. pt states his bretahing has been good. no c/o SOB, wheezing,cough or chest tightness. no concerns at this time. CT MaxFac 11/08/14 IMPRESSION: 1. Multifocal paranasal sinus disease, as above, without air-fluid levels to suggest an acute sinusitis at this time. 2. Small mucosal retention cyst or polyp in the medial aspect of the right frontal sinus. 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 Electronically Signed  By: Vinnie Langton M.D.  On: 11/08/2014 16:06  01/19/2016-65 year old male former smoker/30 pack years, followed for asthma, allergic rhinitis, pleural plaques  (unexplained), chronic sinusitis complicated by A. fib/SVT/CAD, rosacea, esophageal dysmotility/GERD Allergy Vaccine 1:10 [facility]   04/22/2016-65 year old male former smoker/30 pack years, followed for asthma, allergic rhinitis, pleural plaques (unexplained), chronic sinusitis complicated by A. fib/SVT/CAD, rosacea, esophageal dysmotility/GERD Allergy Vaccine 1:10 [facility] We had given verapamil 80 mg 3 times a day last visit based on very limited reporting that that may reduce chronic rhinosinusitis inflammation in some individuals. He quit it, feeling it made him too sleepy. FOLLOWS FOR: Pt continues to get allergy injections weekly-no reactions. Pt states he still has mucus draining in throat at night-ongoing issue No cough or wheeze. Rare need for rescue inhaler.  Mild sore feeling in maxillary sinus areas. Rarely has to blow his nose. Denies any choking or coughing when he eats. Main complaint is that he wakes with need to clear his throat for 5 times a night and again on waking in the morning. This was not affected by an acid blocker or elevating head of bed with wedge and electric bed. He is not aware of much reflux. No change in this pattern while he stayed in a motel in [place] for several days. Still has Flonase and Astelin nasal sprays and uses a Neti pot  ROS-see HPI Constitutional:   No-   weight loss, night sweats, fevers, chills, fatigue, lassitude. HEENT:   No headaches, difficulty swallowing, tooth/dental problems, +sore throat,       No-  sneezing, itching, ear ache,   + nasal congestion, post nasal drip,  CV:  No-   chest pain, orthopnea, PND, swelling in lower extremities, anasarca,  dizziness, palpitations Resp: +shortness of breath with exertion or at rest.              +  productive cough,  + non-productive cough,  No- coughing up of blood.              No-   change in color of mucus.  No- wheezing.   Skin: No-   rash or lesions. GI:  No-   heartburn, indigestion, abdominal pain,  nausea, GU: MS:  No-   joint pain or swelling.   Neuro-     nothing unusual Psych:  No- change in mood or affect. No depression or anxiety.  No memory loss.  OBJ General- Alert, Oriented, Affect-appropriate, Distress- none acute Skin- rash-none, lesions- none, excoriation- none. +Chronic rhinophyma appearance Lymphadenopathy- none Head- atraumatic            Eyes- Gross vision intact, PERRLA, conjunctivae clear secretions            Ears- Hearing, canals-normal            Nose- + sl nasal turbinate edema, no-Septal dev, mucus, polyps, erosion, perforation             Throat- Mallampati II , mucosa clear , drainage- none, tonsils- atrophic Neck- flexible , trachea midline, no stridor , thyroid nl, carotid no bruit Chest - symmetrical excursion , unlabored           Heart/CV- RRR- rate controlled , no murmur , no gallop  , no rub, nl s1 s2                           - JVD- none , edema- none, stasis changes- none, varices- none           Lung- clear, wheeze- none, cough- none , dullness-none, rub- none           Chest wall-  Abd- Extrem- cyanosis- none, clubbing, none, atrophy- none, strength- nl Neuro- grossly intact to observation

## 2016-04-22 NOTE — Assessment & Plan Note (Signed)
We discussed stopping allergy vaccine here as I slow down my practice, anticipating that we will close the allergy clinic this winter. He can continue Flonase, Astelin, Neti pot. If he needs more help he can seek evaluation from one of the other allergy practices.

## 2016-04-22 NOTE — Assessment & Plan Note (Addendum)
Mild intermittent uncomplicated well-controlled asthma symptoms. He is not needing rescue inhaler and asthma is not disturbing his sleep.

## 2016-04-27 ENCOUNTER — Ambulatory Visit (INDEPENDENT_AMBULATORY_CARE_PROVIDER_SITE_OTHER): Payer: 59 | Admitting: Internal Medicine

## 2016-04-27 ENCOUNTER — Encounter: Payer: Self-pay | Admitting: Internal Medicine

## 2016-04-27 ENCOUNTER — Ambulatory Visit (INDEPENDENT_AMBULATORY_CARE_PROVIDER_SITE_OTHER): Payer: 59 | Admitting: *Deleted

## 2016-04-27 DIAGNOSIS — I480 Paroxysmal atrial fibrillation: Secondary | ICD-10-CM

## 2016-04-27 DIAGNOSIS — K219 Gastro-esophageal reflux disease without esophagitis: Secondary | ICD-10-CM | POA: Diagnosis not present

## 2016-04-27 DIAGNOSIS — I10 Essential (primary) hypertension: Secondary | ICD-10-CM

## 2016-04-27 DIAGNOSIS — R059 Cough, unspecified: Secondary | ICD-10-CM

## 2016-04-27 DIAGNOSIS — R05 Cough: Secondary | ICD-10-CM

## 2016-04-27 DIAGNOSIS — Z23 Encounter for immunization: Secondary | ICD-10-CM | POA: Diagnosis not present

## 2016-04-27 DIAGNOSIS — J309 Allergic rhinitis, unspecified: Secondary | ICD-10-CM | POA: Diagnosis not present

## 2016-04-27 DIAGNOSIS — L719 Rosacea, unspecified: Secondary | ICD-10-CM

## 2016-04-27 MED ORDER — DOXYCYCLINE HYCLATE 100 MG PO TABS: 100.0000 mg | ORAL_TABLET | Freq: Two times a day (BID) | ORAL | 5 refills | Status: DC

## 2016-04-27 NOTE — Progress Notes (Addendum)
Subjective:  Patient ID: John Boone, male    DOB: [birth year]  Age: 65 y.o. MRN: [id number]  CC: No chief complaint on file.   HPI John Boone presents for rosacea, HTN, GERD f/u  Outpatient Medications Prior to Visit  Medication Sig Dispense Refill  . albuterol (PROVENTIL HFA) 108 (90 BASE) MCG/ACT inhaler Inhale 2 puffs into the lungs 4 (four) times daily as needed for shortness of breath. 1 Inhaler 6  . aspirin 325 MG tablet Take 325 mg by mouth daily.      Marland Kitchen azelastine (ASTELIN) 0.1 % nasal spray 1-2 sprays each nostril once or twice daily 30 mL 11  . Cholecalciferol 1000 UNITS tablet Take 1,000 Units by mouth daily. Reported on 01/06/2016    . doxycycline (VIBRA-TABS) 100 MG tablet Take 1 tablet (100 mg total) by mouth 2 (two) times daily. 60 tablet 5  . fluticasone (FLONASE) 50 MCG/ACT nasal spray 1-2 puffs each nostril once daily at bedtime 16 g 11  . glycopyrrolate (ROBINUL) 1 MG tablet Take 1-2 tablets (1-2 mg total) by mouth 2 (two) times daily. 100 tablet 3  . metoprolol succinate (TOPROL-XL) 25 MG 24 hr tablet TAKE 1 TABLET BY MOUTH TWICE A DAY 60 tablet 11  . NONFORMULARY OR COMPOUNDED ITEM Allergy Vaccine 1:10 Given at [facility]    . pantoprazole (PROTONIX) 40 MG tablet TAKE 1 TABLET (40 MG TOTAL) BY MOUTH DAILY. 30 tablet 11   No facility-administered medications prior to visit.     ROS Review of Systems  Constitutional: Negative for appetite change, fatigue and unexpected weight change.  HENT: Negative for congestion, nosebleeds, sneezing, sore throat and trouble swallowing.   Eyes: Negative for itching and visual disturbance.  Respiratory: Negative for cough.   Cardiovascular: Negative for chest pain, palpitations and leg swelling.  Gastrointestinal: Negative for abdominal distention, blood in stool, diarrhea and nausea.  Genitourinary: Negative for frequency and hematuria.  Musculoskeletal: Negative for back pain, gait problem, joint swelling and neck  pain.  Skin: Positive for rash.  Neurological: Negative for dizziness, tremors, speech difficulty and weakness.  Psychiatric/Behavioral: Negative for agitation, dysphoric mood and sleep disturbance. The patient is not nervous/anxious.     Objective:  BP (!) 148/100   Pulse 83   Wt 240 lb (108.9 kg)   SpO2 96%   BMI 30.81 kg/m   BP Readings from Last 3 Encounters:  04/27/16 (!) 148/100  04/22/16 126/80  01/20/16 132/82    Wt Readings from Last 3 Encounters:  04/27/16 240 lb (108.9 kg)  04/22/16 240 lb 6.4 oz (109 kg)  01/20/16 237 lb 6.4 oz (107.7 kg)    Physical Exam  Constitutional: He is oriented to person, place, and time. He appears well-developed. No distress.  NAD  HENT:  Mouth/Throat: Oropharynx is clear and moist.  Eyes: Conjunctivae are normal. Pupils are equal, round, and reactive to light.  Neck: Normal range of motion. No JVD present. No thyromegaly present.  Cardiovascular: Normal rate, regular rhythm, normal heart sounds and intact distal pulses.  Exam reveals no gallop and no friction rub.   No murmur heard. Pulmonary/Chest: Effort normal and breath sounds normal. No respiratory distress. He has no wheezes. He has no rales. He exhibits no tenderness.  Abdominal: Soft. Bowel sounds are normal. He exhibits no distension and no mass. There is no tenderness. There is no rebound and no guarding.  Musculoskeletal: Normal range of motion. He exhibits no edema or tenderness.  Lymphadenopathy:  He has no cervical adenopathy.  Neurological: He is alert and oriented to person, place, and time. He has normal reflexes. No cranial nerve deficit. He exhibits normal muscle tone. He displays a negative Romberg sign. Coordination and gait normal.  Skin: Skin is warm and dry. Rash noted. There is erythema.  Psychiatric: He has a normal mood and affect. His behavior is normal. Judgment and thought content normal.    Lab Results  Component Value Date   WBC 7.1 10/13/2015    HGB 15.6 10/13/2015   HCT 47.3 10/13/2015   PLT 224.0 10/13/2015   GLUCOSE 92 10/13/2015   CHOL 174 10/13/2015   TRIG 84.0 10/13/2015   HDL 36.80 (L) 10/13/2015   LDLCALC 120 (H) 10/13/2015   ALT 25 10/13/2015   AST 21 10/13/2015   NA 143 10/13/2015   K 4.7 10/13/2015   CL 107 10/13/2015   CREATININE 1.15 10/13/2015   BUN 26 (H) 10/13/2015   CO2 30 10/13/2015   TSH 0.52 10/13/2015   PSA 0.85 10/13/2015    Ct Maxillofacial Ltd Wo Cm  Result Date: 11/08/2014 CLINICAL DATA:  65 year old male with chronic sinus drainage, cough and sinus pressure for the past year. Five rounds of antibiotic therapy with no relief of symptoms. EXAM: CT PARANASAL SINUS LIMITED WITHOUT CONTRAST TECHNIQUE: Non-contiguous multidetector CT images of the paranasal sinuses were obtained in a single plane without contrast. COMPARISON:  No priors. FINDINGS: Multifocal mucosal thickening in the paranasal sinuses, most evident throughout the ethmoid sinuses, and to a lesser extent in the maxillary and frontal sinuses bilaterally. In the medial aspect of the right frontal sinus there is a polypoid lesion, which may represent a mucosal retention cyst or polyp, which measures approximately 1.8 x 1.2 cm. Notably, there is also a small 1.1 x 0.8 cm partially ossified lesion in the medial aspect of the right frontal sinus (image 13 of series 5), which could represent a very small osteoma. No significant nasal septal deviation. No air-fluid levels. IMPRESSION: 1. Multifocal paranasal sinus disease, as above, without air-fluid levels to suggest an acute sinusitis at this time. 2. Small mucosal retention cyst or polyp in the medial aspect of the right frontal sinus. 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 Electronically Signed   By: Vinnie Langton M.D.   On: 11/08/2014 16:06    Assessment & Plan:   There are no diagnoses linked to this encounter. I am having [name] maintain his aspirin, Cholecalciferol, albuterol, NONFORMULARY OR COMPOUNDED ITEM, doxycycline, glycopyrrolate, pantoprazole, metoprolol succinate, azelastine, and fluticasone.  No orders of the defined types were placed in this encounter.    Follow-up: No Follow-up on file.  Walker Kehr, MD

## 2016-04-27 NOTE — Progress Notes (Signed)
Pre visit review using our clinic review tool, if applicable. No additional management support is needed unless otherwise documented below in the visit note. 

## 2016-05-04 ENCOUNTER — Ambulatory Visit (INDEPENDENT_AMBULATORY_CARE_PROVIDER_SITE_OTHER): Payer: 59 | Admitting: *Deleted

## 2016-05-04 DIAGNOSIS — J309 Allergic rhinitis, unspecified: Secondary | ICD-10-CM

## 2016-05-11 ENCOUNTER — Ambulatory Visit (INDEPENDENT_AMBULATORY_CARE_PROVIDER_SITE_OTHER): Payer: 59 | Admitting: *Deleted

## 2016-05-11 DIAGNOSIS — J309 Allergic rhinitis, unspecified: Secondary | ICD-10-CM | POA: Diagnosis not present

## 2016-05-18 ENCOUNTER — Ambulatory Visit (INDEPENDENT_AMBULATORY_CARE_PROVIDER_SITE_OTHER): Payer: 59 | Admitting: *Deleted

## 2016-05-18 DIAGNOSIS — J309 Allergic rhinitis, unspecified: Secondary | ICD-10-CM | POA: Diagnosis not present

## 2016-05-18 NOTE — Assessment & Plan Note (Signed)
GERD memory foam wedge Body pillow Protonix po

## 2016-05-18 NOTE — Assessment & Plan Note (Signed)
Doxy po qd

## 2016-05-18 NOTE — Assessment & Plan Note (Signed)
Toprol XL 

## 2016-05-18 NOTE — Assessment & Plan Note (Addendum)
No relapse Toprol XL, ASA

## 2016-05-25 ENCOUNTER — Ambulatory Visit (INDEPENDENT_AMBULATORY_CARE_PROVIDER_SITE_OTHER): Payer: 59 | Admitting: *Deleted

## 2016-05-25 DIAGNOSIS — J309 Allergic rhinitis, unspecified: Secondary | ICD-10-CM | POA: Diagnosis not present

## 2016-06-01 ENCOUNTER — Ambulatory Visit (INDEPENDENT_AMBULATORY_CARE_PROVIDER_SITE_OTHER): Payer: 59 | Admitting: *Deleted

## 2016-06-01 DIAGNOSIS — J309 Allergic rhinitis, unspecified: Secondary | ICD-10-CM | POA: Diagnosis not present

## 2016-06-08 ENCOUNTER — Ambulatory Visit (INDEPENDENT_AMBULATORY_CARE_PROVIDER_SITE_OTHER): Payer: 59 | Admitting: *Deleted

## 2016-06-08 DIAGNOSIS — J309 Allergic rhinitis, unspecified: Secondary | ICD-10-CM | POA: Diagnosis not present

## 2016-06-15 ENCOUNTER — Ambulatory Visit (INDEPENDENT_AMBULATORY_CARE_PROVIDER_SITE_OTHER): Payer: 59 | Admitting: *Deleted

## 2016-06-15 DIAGNOSIS — J309 Allergic rhinitis, unspecified: Secondary | ICD-10-CM

## 2016-06-22 ENCOUNTER — Ambulatory Visit (INDEPENDENT_AMBULATORY_CARE_PROVIDER_SITE_OTHER): Payer: 59 | Admitting: *Deleted

## 2016-06-22 DIAGNOSIS — J309 Allergic rhinitis, unspecified: Secondary | ICD-10-CM | POA: Diagnosis not present

## 2016-06-29 ENCOUNTER — Ambulatory Visit (INDEPENDENT_AMBULATORY_CARE_PROVIDER_SITE_OTHER): Payer: 59 | Admitting: *Deleted

## 2016-06-29 DIAGNOSIS — J309 Allergic rhinitis, unspecified: Secondary | ICD-10-CM

## 2016-07-06 ENCOUNTER — Ambulatory Visit (INDEPENDENT_AMBULATORY_CARE_PROVIDER_SITE_OTHER): Payer: 59

## 2016-07-06 DIAGNOSIS — J309 Allergic rhinitis, unspecified: Secondary | ICD-10-CM

## 2016-10-27 ENCOUNTER — Ambulatory Visit (INDEPENDENT_AMBULATORY_CARE_PROVIDER_SITE_OTHER): Payer: 59 | Admitting: Internal Medicine

## 2016-10-27 ENCOUNTER — Encounter: Payer: Self-pay | Admitting: Internal Medicine

## 2016-10-27 VITALS — BP 170/98 | HR 68 | Temp 98.4°F | Wt 239.0 lb

## 2016-10-27 DIAGNOSIS — Z0000 Encounter for general adult medical examination without abnormal findings: Secondary | ICD-10-CM | POA: Diagnosis not present

## 2016-10-27 DIAGNOSIS — J4520 Mild intermittent asthma, uncomplicated: Secondary | ICD-10-CM

## 2016-10-27 DIAGNOSIS — J3089 Other allergic rhinitis: Secondary | ICD-10-CM

## 2016-10-27 MED ORDER — IPRATROPIUM BROMIDE 0.06 % NA SOLN: 2.0000 | Freq: Three times a day (TID) | NASAL | 2 refills | Status: DC

## 2016-10-27 NOTE — Assessment & Plan Note (Signed)
We discussed age appropriate health related issues, including available/recomended screening tests and vaccinations. We discussed a need for adhering to healthy diet and exercise. Labs/EKG were reviewed/ordered. All questions were answered.   

## 2016-10-27 NOTE — Progress Notes (Signed)
Pre visit review using our clinic review tool, if applicable. No additional management support is needed unless otherwise documented below in the visit note. 

## 2016-10-27 NOTE — Progress Notes (Signed)
Subjective:  Patient ID: John Boone, male    DOB: 17-May-1951  Age: 66 y.o. MRN: [id number]  CC: No chief complaint on file.   HPI John Boone presents for a well exam C/o chronic sinus problem   Outpatient Medications Prior to Visit  Medication Sig Dispense Refill  . albuterol (PROVENTIL HFA) 108 (90 BASE) MCG/ACT inhaler Inhale 2 puffs into the lungs 4 (four) times daily as needed for shortness of breath. 1 Inhaler 6  . aspirin 325 MG tablet Take 325 mg by mouth daily.      Marland Kitchen azelastine (ASTELIN) 0.1 % nasal spray 1-2 sprays each nostril once or twice daily 30 mL 11  . Cholecalciferol 1000 UNITS tablet Take 1,000 Units by mouth daily. Reported on 01/06/2016    . doxycycline (VIBRA-TABS) 100 MG tablet Take 1 tablet (100 mg total) by mouth 2 (two) times daily. 60 tablet 5  . fluticasone (FLONASE) 50 MCG/ACT nasal spray 1-2 puffs each nostril once daily at bedtime 16 g 11  . metoprolol succinate (TOPROL-XL) 25 MG 24 hr tablet TAKE 1 TABLET BY MOUTH TWICE A DAY 60 tablet 11  . glycopyrrolate (ROBINUL) 1 MG tablet Take 1-2 tablets (1-2 mg total) by mouth 2 (two) times daily. (Patient not taking: Reported on 10/27/2016) 100 tablet 3  . NONFORMULARY OR COMPOUNDED ITEM Allergy Vaccine 1:10 Given at [facility]    . pantoprazole (PROTONIX) 40 MG tablet TAKE 1 TABLET (40 MG TOTAL) BY MOUTH DAILY. (Patient not taking: Reported on 10/27/2016) 30 tablet 11   No facility-administered medications prior to visit.     ROS Review of Systems  Constitutional: Negative for appetite change, fatigue and unexpected weight change.  HENT: Positive for postnasal drip, rhinorrhea and sinus pressure. Negative for congestion, nosebleeds, sneezing, sore throat and trouble swallowing.   Eyes: Negative for itching and visual disturbance.  Respiratory: Negative for cough.   Cardiovascular: Negative for chest pain, palpitations and leg swelling.  Gastrointestinal: Negative for abdominal distention, blood  in stool, diarrhea and nausea.  Genitourinary: Negative for frequency and hematuria.  Musculoskeletal: Negative for back pain, gait problem, joint swelling and neck pain.  Skin: Negative for rash.  Neurological: Negative for dizziness, tremors, speech difficulty and weakness.  Psychiatric/Behavioral: Negative for agitation, dysphoric mood and sleep disturbance. The patient is not nervous/anxious.     Objective:  BP (!) 170/98   Pulse 68   Temp 98.4 F (36.9 C)   Wt 239 lb (108.4 kg)   SpO2 98%   BMI 30.69 kg/m   BP Readings from Last 3 Encounters:  10/27/16 (!) 170/98  04/27/16 (!) 148/100  04/22/16 126/80    Wt Readings from Last 3 Encounters:  10/27/16 239 lb (108.4 kg)  04/27/16 240 lb (108.9 kg)  04/22/16 240 lb 6.4 oz (109 kg)    Physical Exam  Constitutional: He is oriented to person, place, and time. He appears well-developed. No distress.  NAD  HENT:  Mouth/Throat: Oropharynx is clear and moist. No oropharyngeal exudate.  Eyes: Conjunctivae are normal. Pupils are equal, round, and reactive to light.  Neck: Normal range of motion. No JVD present. No thyromegaly present.  Cardiovascular: Normal rate, regular rhythm, normal heart sounds and intact distal pulses.  Exam reveals no gallop and no friction rub.   No murmur heard. Pulmonary/Chest: Effort normal and breath sounds normal. No respiratory distress. He has no wheezes. He has no rales. He exhibits no tenderness.  Abdominal: Soft. Bowel sounds are normal. He  exhibits no distension and no mass. There is no tenderness. There is no rebound and no guarding.  Musculoskeletal: Normal range of motion. He exhibits no edema or tenderness.  Lymphadenopathy:    He has no cervical adenopathy.  Neurological: He is alert and oriented to person, place, and time. He has normal reflexes. No cranial nerve deficit. He exhibits normal muscle tone. He displays a negative Romberg sign. Coordination and gait normal.  Skin: Skin is warm  and dry. Rash noted.  Psychiatric: He has a normal mood and affect. His behavior is normal. Judgment and thought content normal.  swollen nasal mucosa Rhinophyma   Lab Results  Component Value Date   WBC 7.1 10/13/2015   HGB 15.6 10/13/2015   HCT 47.3 10/13/2015   PLT 224.0 10/13/2015   GLUCOSE 92 10/13/2015   CHOL 174 10/13/2015   TRIG 84.0 10/13/2015   HDL 36.80 (L) 10/13/2015   LDLCALC 120 (H) 10/13/2015   ALT 25 10/13/2015   AST 21 10/13/2015   NA 143 10/13/2015   K 4.7 10/13/2015   CL 107 10/13/2015   CREATININE 1.15 10/13/2015   BUN 26 (H) 10/13/2015   CO2 30 10/13/2015   TSH 0.52 10/13/2015   PSA 0.85 10/13/2015    Ct Maxillofacial Ltd Wo Cm  Result Date: 11/08/2014 CLINICAL DATA:  66 year old male with chronic sinus drainage, cough and sinus pressure for the past year. Five rounds of antibiotic therapy with no relief of symptoms. EXAM: CT PARANASAL SINUS LIMITED WITHOUT CONTRAST TECHNIQUE: Non-contiguous multidetector CT images of the paranasal sinuses were obtained in a single plane without contrast. COMPARISON:  No priors. FINDINGS: Multifocal mucosal thickening in the paranasal sinuses, most evident throughout the ethmoid sinuses, and to a lesser extent in the maxillary and frontal sinuses bilaterally. In the medial aspect of the right frontal sinus there is a polypoid lesion, which may represent a mucosal retention cyst or polyp, which measures approximately 1.8 x 1.2 cm. Notably, there is also a small 1.1 x 0.8 cm partially ossified lesion in the medial aspect of the right frontal sinus (image 13 of series 5), which could represent a very small osteoma. No significant nasal septal deviation. No air-fluid levels. IMPRESSION: 1. Multifocal paranasal sinus disease, as above, without air-fluid levels to suggest an acute sinusitis at this time. 2. Small mucosal retention cyst or polyp in the medial aspect of the right frontal sinus. Adjacent to this there is also a partially  ossified lesion, which may represent a small sinus osteoma. Electronically Signed   By: Vinnie Langton M.D.   On: 11/08/2014 16:06    Assessment & Plan:   There are no diagnoses linked to this encounter. I am having Mr. Ohmann maintain his aspirin, Cholecalciferol, albuterol, NONFORMULARY OR COMPOUNDED ITEM, glycopyrrolate, pantoprazole, metoprolol succinate, azelastine, fluticasone, and doxycycline.  No orders of the defined types were placed in this encounter.    Follow-up: No Follow-up on file.  Walker Kehr, MD

## 2016-10-27 NOTE — Assessment & Plan Note (Signed)
Doing well 

## 2016-10-28 ENCOUNTER — Other Ambulatory Visit: Payer: Self-pay | Admitting: Internal Medicine

## 2017-03-21 ENCOUNTER — Other Ambulatory Visit: Payer: Self-pay | Admitting: Internal Medicine

## 2017-04-21 ENCOUNTER — Other Ambulatory Visit: Payer: Self-pay | Admitting: Internal Medicine

## 2017-04-29 ENCOUNTER — Other Ambulatory Visit (INDEPENDENT_AMBULATORY_CARE_PROVIDER_SITE_OTHER): Payer: 59

## 2017-04-29 DIAGNOSIS — Z0000 Encounter for general adult medical examination without abnormal findings: Secondary | ICD-10-CM

## 2017-04-29 LAB — BASIC METABOLIC PANEL
BUN: 17 mg/dL (ref 6–23)
CALCIUM: 9.7 mg/dL (ref 8.4–10.5)
CO2: 25 mEq/L (ref 19–32)
Chloride: 103 mEq/L (ref 96–112)
Creatinine, Ser: 0.96 mg/dL (ref 0.40–1.50)
GFR: 83.56 mL/min (ref >60.00)
GLUCOSE: 93 mg/dL (ref 70–99)
Potassium: 4.3 mEq/L (ref 3.5–5.1)
SODIUM: 139 meq/L (ref 135–145)

## 2017-04-29 LAB — TSH: TSH: 1.33 u[IU]/mL (ref 0.35–4.50)

## 2017-04-29 LAB — CBC WITH DIFFERENTIAL/PLATELET
BASOS ABS: 0 10*3/uL (ref 0.0–0.1)
Basophils Relative: 0.5 % (ref 0.0–3.0)
Eosinophils Absolute: 0.3 10*3/uL (ref 0.0–0.7)
Eosinophils Relative: 4.3 % (ref 0.0–5.0)
HCT: 49.1 % (ref 39.0–52.0)
HEMOGLOBIN: 16.2 g/dL (ref 13.0–17.0)
LYMPHS ABS: 2.1 10*3/uL (ref 0.7–4.0)
Lymphocytes Relative: 28.8 % (ref 12.0–46.0)
MCHC: 33 g/dL (ref 30.0–36.0)
MCV: 91.6 fl (ref 78.0–100.0)
MONO ABS: 0.6 10*3/uL (ref 0.1–1.0)
MONOS PCT: 8 % (ref 3.0–12.0)
NEUTROS PCT: 58.4 % (ref 43.0–77.0)
Neutro Abs: 4.2 10*3/uL (ref 1.4–7.7)
Platelets: 214 10*3/uL (ref 150.0–400.0)
RBC: 5.36 Mil/uL (ref 4.22–5.81)
RDW: 13.7 % (ref 11.5–15.5)
WBC: 7.3 10*3/uL (ref 4.0–10.5)

## 2017-04-29 LAB — URINALYSIS
BILIRUBIN URINE: NEGATIVE
HGB URINE DIPSTICK: NEGATIVE
KETONES UR: NEGATIVE
Leukocytes, UA: NEGATIVE
Nitrite: NEGATIVE
Specific Gravity, Urine: 1.025 (ref 1.000–1.030)
Total Protein, Urine: NEGATIVE
UROBILINOGEN UA: 0.2 (ref 0.0–1.0)
Urine Glucose: NEGATIVE
pH: 6 (ref 5.0–8.0)

## 2017-04-29 LAB — LIPID PANEL
CHOL/HDL RATIO: 4
Cholesterol: 225 mg/dL — ABNORMAL HIGH (ref 0–200)
HDL: 54.1 mg/dL (ref >39.00)
LDL CALC: 143 mg/dL — AB (ref 0–99)
NONHDL: 171.06
Triglycerides: 141 mg/dL (ref 0.0–149.0)
VLDL: 28.2 mg/dL (ref 0.0–40.0)

## 2017-04-29 LAB — PSA: PSA: 1.39 ng/mL (ref 0.10–4.00)

## 2017-04-29 LAB — HEPATIC FUNCTION PANEL
ALT: 18 U/L (ref 0–53)
AST: 18 U/L (ref 0–37)
Albumin: 4.4 g/dL (ref 3.5–5.2)
Alkaline Phosphatase: 62 U/L (ref 39–117)
BILIRUBIN TOTAL: 0.6 mg/dL (ref 0.2–1.2)
Bilirubin, Direct: 0.1 mg/dL (ref 0.0–0.3)
Total Protein: 6.7 g/dL (ref 6.0–8.3)

## 2017-05-17 ENCOUNTER — Encounter: Payer: Self-pay | Admitting: Internal Medicine

## 2017-05-17 ENCOUNTER — Other Ambulatory Visit (INDEPENDENT_AMBULATORY_CARE_PROVIDER_SITE_OTHER): Payer: 59

## 2017-05-17 ENCOUNTER — Ambulatory Visit (INDEPENDENT_AMBULATORY_CARE_PROVIDER_SITE_OTHER)
Admission: RE | Admit: 2017-05-17 | Discharge: 2017-05-17 | Disposition: A | Discharge status: Home / Self Care | Payer: 59 | Source: Ambulatory Visit | Attending: Internal Medicine | Admitting: Internal Medicine

## 2017-05-17 ENCOUNTER — Ambulatory Visit (INDEPENDENT_AMBULATORY_CARE_PROVIDER_SITE_OTHER): Payer: 59 | Admitting: Internal Medicine

## 2017-05-17 DIAGNOSIS — R1031 Right lower quadrant pain: Secondary | ICD-10-CM

## 2017-05-17 DIAGNOSIS — R1084 Generalized abdominal pain: Secondary | ICD-10-CM | POA: Diagnosis not present

## 2017-05-17 LAB — CBC WITH DIFFERENTIAL/PLATELET
BASOS PCT: 0.6 % (ref 0.0–3.0)
Basophils Absolute: 0 10*3/uL (ref 0.0–0.1)
Eosinophils Absolute: 0.3 10*3/uL (ref 0.0–0.7)
Eosinophils Relative: 5.1 % — ABNORMAL HIGH (ref 0.0–5.0)
HCT: 47.6 % (ref 39.0–52.0)
HEMOGLOBIN: 16 g/dL (ref 13.0–17.0)
LYMPHS ABS: 1.9 10*3/uL (ref 0.7–4.0)
Lymphocytes Relative: 27.5 % (ref 12.0–46.0)
MCHC: 33.6 g/dL (ref 30.0–36.0)
MCV: 91.1 fl (ref 78.0–100.0)
Monocytes Absolute: 0.6 10*3/uL (ref 0.1–1.0)
Monocytes Relative: 9.1 % (ref 3.0–12.0)
NEUTROS PCT: 57.7 % (ref 43.0–77.0)
Neutro Abs: 4 10*3/uL (ref 1.4–7.7)
Platelets: 218 10*3/uL (ref 150.0–400.0)
RBC: 5.22 Mil/uL (ref 4.22–5.81)
RDW: 13.6 % (ref 11.5–15.5)
WBC: 6.9 10*3/uL (ref 4.0–10.5)

## 2017-05-17 LAB — URINALYSIS
Bilirubin Urine: NEGATIVE
Hgb urine dipstick: NEGATIVE
Ketones, ur: NEGATIVE
Leukocytes, UA: NEGATIVE
NITRITE: NEGATIVE
PH: 6 (ref 5.0–8.0)
Specific Gravity, Urine: 1.025 (ref 1.000–1.030)
TOTAL PROTEIN, URINE-UPE24: NEGATIVE
URINE GLUCOSE: NEGATIVE
Urobilinogen, UA: 0.2 (ref 0.0–1.0)

## 2017-05-17 LAB — HEPATIC FUNCTION PANEL
ALK PHOS: 63 U/L (ref 39–117)
ALT: 17 U/L (ref 0–53)
AST: 15 U/L (ref 0–37)
Albumin: 4.4 g/dL (ref 3.5–5.2)
BILIRUBIN TOTAL: 0.4 mg/dL (ref 0.2–1.2)
Bilirubin, Direct: 0.1 mg/dL (ref 0.0–0.3)
Total Protein: 7.2 g/dL (ref 6.0–8.3)

## 2017-05-17 LAB — SEDIMENTATION RATE: Sed Rate: 5 mm/hr (ref 0–20)

## 2017-05-17 LAB — BASIC METABOLIC PANEL
BUN: 20 mg/dL (ref 6–23)
CO2: 28 mEq/L (ref 19–32)
Calcium: 9.8 mg/dL (ref 8.4–10.5)
Chloride: 104 mEq/L (ref 96–112)
Creatinine, Ser: 1.03 mg/dL (ref 0.40–1.50)
GFR: 77.03 mL/min (ref >60.00)
Glucose, Bld: 93 mg/dL (ref 70–99)
Potassium: 4.4 mEq/L (ref 3.5–5.1)
Sodium: 139 mEq/L (ref 135–145)

## 2017-05-17 MED ORDER — IOPAMIDOL (ISOVUE-300) INJECTION 61%
100.0000 mL | Freq: Once | INTRAVENOUS | Status: AC | PRN
  Administered 2017-05-17: 100 mL via INTRAVENOUS

## 2017-05-17 NOTE — Progress Notes (Signed)
Subjective:  Patient ID: John Boone, male    DOB: October 30, 1950  Age: 66 y.o. MRN: [id number]  CC: No chief complaint on file.   HPI John Boone presents for RLQ abd pain x3-4 days - better now 4-5/10 w/activity. No n/v. No fever  Outpatient Medications Prior to Visit  Medication Sig Dispense Refill  . albuterol (PROVENTIL HFA) 108 (90 BASE) MCG/ACT inhaler Inhale 2 puffs into the lungs 4 (four) times daily as needed for shortness of breath. 1 Inhaler 6  . aspirin 325 MG tablet Take 325 mg by mouth daily.      . Cholecalciferol 1000 UNITS tablet Take 1,000 Units by mouth daily. Reported on 01/06/2016    . doxycycline (VIBRA-TABS) 100 MG tablet TAKE 1 TABLET (100 MG TOTAL) BY MOUTH 2 (TWO) TIMES DAILY. 60 tablet 5  . fluticasone (FLONASE) 50 MCG/ACT nasal spray 1-2 puffs each nostril once daily at bedtime 16 g 11  . metoprolol succinate (TOPROL-XL) 25 MG 24 hr tablet TAKE 1 TABLET BY MOUTH TWICE A DAY 60 tablet 5  . azelastine (ASTELIN) 0.1 % nasal spray 1-2 sprays each nostril once or twice daily (Patient not taking: Reported on 05/17/2017) 30 mL 11  . glycopyrrolate (ROBINUL) 1 MG tablet Take 1-2 tablets (1-2 mg total) by mouth 2 (two) times daily. (Patient not taking: Reported on 10/27/2016) 100 tablet 3  . ipratropium (ATROVENT) 0.06 % nasal spray Place 2 sprays into the nose 3 (three) times daily. (Patient not taking: Reported on 05/17/2017) 15 mL 2  . NONFORMULARY OR COMPOUNDED ITEM Allergy Vaccine 1:10 Given at [facility]    . pantoprazole (PROTONIX) 40 MG tablet TAKE 1 TABLET (40 MG TOTAL) BY MOUTH DAILY. (Patient not taking: Reported on 10/27/2016) 30 tablet 11   No facility-administered medications prior to visit.     ROS Review of Systems  Constitutional: Negative for appetite change, fatigue and unexpected weight change.  HENT: Negative for congestion, nosebleeds, sneezing, sore throat and trouble swallowing.   Eyes: Negative for itching and visual disturbance.    Respiratory: Negative for cough.   Cardiovascular: Negative for chest pain, palpitations and leg swelling.  Gastrointestinal: Positive for abdominal pain. Negative for abdominal distention, blood in stool, diarrhea and nausea.  Genitourinary: Negative for frequency and hematuria.  Musculoskeletal: Negative for back pain, gait problem, joint swelling and neck pain.  Skin: Negative for rash.  Neurological: Negative for dizziness, tremors, speech difficulty and weakness.  Psychiatric/Behavioral: Negative for agitation, dysphoric mood, sleep disturbance and suicidal ideas. The patient is not nervous/anxious.     Objective:  BP 140/88 (BP Location: Left Arm, Patient Position: Sitting, Cuff Size: Large)   Pulse 76   Temp 98.9 F (37.2 C) (Oral)   Ht 6\' 2"  (1.88 m)   Wt 234 lb (106.1 kg)   SpO2 99%   BMI 30.04 kg/m   BP Readings from Last 3 Encounters:  05/17/17 140/88  10/27/16 (!) 170/98  04/27/16 (!) 148/100    Wt Readings from Last 3 Encounters:  05/17/17 234 lb (106.1 kg)  10/27/16 239 lb (108.4 kg)  04/27/16 240 lb (108.9 kg)    Physical Exam  Constitutional: He is oriented to person, place, and time. He appears well-developed. No distress.  NAD  HENT:  Mouth/Throat: Oropharynx is clear and moist.  Eyes: Pupils are equal, round, and reactive to light. Conjunctivae are normal.  Neck: Normal range of motion. No JVD present. No thyromegaly present.  Cardiovascular: Normal rate, regular rhythm, normal  heart sounds and intact distal pulses.  Exam reveals no gallop and no friction rub.   No murmur heard. Pulmonary/Chest: Effort normal and breath sounds normal. No respiratory distress. He has no wheezes. He has no rales. He exhibits no tenderness.  Abdominal: Soft. Bowel sounds are normal. He exhibits no distension and no mass. There is no tenderness. There is no rebound and no guarding.  Musculoskeletal: Normal range of motion. He exhibits no edema or tenderness.   Lymphadenopathy:    He has no cervical adenopathy.  Neurological: He is alert and oriented to person, place, and time. He has normal reflexes. No cranial nerve deficit. He exhibits normal muscle tone. He displays a negative Romberg sign. Coordination and gait normal.  Skin: Skin is warm and dry. No rash noted.  Psychiatric: He has a normal mood and affect. His behavior is normal. Judgment and thought content normal.  RLQ is tender  Lab Results  Component Value Date   WBC 7.3 04/29/2017   HGB 16.2 04/29/2017   HCT 49.1 04/29/2017   PLT 214.0 04/29/2017   GLUCOSE 93 04/29/2017   CHOL 225 (H) 04/29/2017   TRIG 141.0 04/29/2017   HDL 54.10 04/29/2017   LDLCALC 143 (H) 04/29/2017   ALT 18 04/29/2017   AST 18 04/29/2017   NA 139 04/29/2017   K 4.3 04/29/2017   CL 103 04/29/2017   CREATININE 0.96 04/29/2017   BUN 17 04/29/2017   CO2 25 04/29/2017   TSH 1.33 04/29/2017   PSA 1.39 04/29/2017    Ct Maxillofacial Ltd Wo Cm  Result Date: 11/08/2014 CLINICAL DATA:  66 year old male with chronic sinus drainage, cough and sinus pressure for the past year. Five rounds of antibiotic therapy with no relief of symptoms. EXAM: CT PARANASAL SINUS LIMITED WITHOUT CONTRAST TECHNIQUE: Non-contiguous multidetector CT images of the paranasal sinuses were obtained in a single plane without contrast. COMPARISON:  No priors. FINDINGS: Multifocal mucosal thickening in the paranasal sinuses, most evident throughout the ethmoid sinuses, and to a lesser extent in the maxillary and frontal sinuses bilaterally. In the medial aspect of the right frontal sinus there is a polypoid lesion, which may represent a mucosal retention cyst or polyp, which measures approximately 1.8 x 1.2 cm. Notably, there is also a small 1.1 x 0.8 cm partially ossified lesion in the medial aspect of the right frontal sinus (image 13 of series 5), which could represent a very small osteoma. No significant nasal septal deviation. No air-fluid  levels. IMPRESSION: 1. Multifocal paranasal sinus disease, as above, without air-fluid levels to suggest an acute sinusitis at this time. 2. Small mucosal retention cyst or polyp in the medial aspect of the right frontal sinus. 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 Electronically Signed   By: Vinnie Langton M.D.   On: 11/08/2014 16:06    Assessment & Plan:   There are no diagnoses linked to this encounter. I have discontinued Mr. Ramcharan NONFORMULARY OR COMPOUNDED ITEM, glycopyrrolate, pantoprazole, azelastine, and ipratropium. I am also having him maintain his aspirin, Cholecalciferol, albuterol, fluticasone, metoprolol succinate, and doxycycline.  No orders of the defined types were placed in this encounter.    Follow-up: No Follow-up on file.  Walker Kehr, MD

## 2017-05-17 NOTE — Assessment & Plan Note (Signed)
CT abd Labs 

## 2017-05-17 NOTE — Patient Instructions (Signed)
Go to ER if worse 

## 2017-09-15 ENCOUNTER — Other Ambulatory Visit: Payer: Self-pay | Admitting: Internal Medicine

## 2017-11-01 ENCOUNTER — Other Ambulatory Visit (INDEPENDENT_AMBULATORY_CARE_PROVIDER_SITE_OTHER): Payer: 59

## 2017-11-01 ENCOUNTER — Encounter: Payer: Self-pay | Admitting: Internal Medicine

## 2017-11-01 ENCOUNTER — Ambulatory Visit (INDEPENDENT_AMBULATORY_CARE_PROVIDER_SITE_OTHER): Payer: 59 | Admitting: Internal Medicine

## 2017-11-01 ENCOUNTER — Ambulatory Visit (INDEPENDENT_AMBULATORY_CARE_PROVIDER_SITE_OTHER)
Admission: RE | Admit: 2017-11-01 | Discharge: 2017-11-01 | Disposition: A | Discharge status: Home / Self Care | Payer: 59 | Source: Ambulatory Visit | Attending: Internal Medicine | Admitting: Internal Medicine

## 2017-11-01 VITALS — BP 136/86 | HR 67 | Temp 98.5°F | Ht 74.0 in | Wt 233.0 lb

## 2017-11-01 DIAGNOSIS — R0789 Other chest pain: Secondary | ICD-10-CM | POA: Diagnosis not present

## 2017-11-01 DIAGNOSIS — Z0000 Encounter for general adult medical examination without abnormal findings: Secondary | ICD-10-CM | POA: Diagnosis not present

## 2017-11-01 DIAGNOSIS — J320 Chronic maxillary sinusitis: Secondary | ICD-10-CM

## 2017-11-01 DIAGNOSIS — I480 Paroxysmal atrial fibrillation: Secondary | ICD-10-CM

## 2017-11-01 DIAGNOSIS — J4520 Mild intermittent asthma, uncomplicated: Secondary | ICD-10-CM

## 2017-11-01 DIAGNOSIS — L719 Rosacea, unspecified: Secondary | ICD-10-CM

## 2017-11-01 LAB — URINALYSIS
Bilirubin Urine: NEGATIVE
Hgb urine dipstick: NEGATIVE
Ketones, ur: NEGATIVE
Leukocytes, UA: NEGATIVE
Nitrite: NEGATIVE
SPECIFIC GRAVITY, URINE: 1.02 (ref 1.000–1.030)
TOTAL PROTEIN, URINE-UPE24: NEGATIVE
UROBILINOGEN UA: 0.2 (ref 0.0–1.0)
Urine Glucose: NEGATIVE
pH: 6 (ref 5.0–8.0)

## 2017-11-01 LAB — BASIC METABOLIC PANEL
BUN: 18 mg/dL (ref 6–23)
CALCIUM: 9.6 mg/dL (ref 8.4–10.5)
CO2: 28 mEq/L (ref 19–32)
CREATININE: 0.89 mg/dL (ref 0.40–1.50)
Chloride: 104 mEq/L (ref 96–112)
GFR: 91.04 mL/min (ref >60.00)
Glucose, Bld: 91 mg/dL (ref 70–99)
Potassium: 3.8 mEq/L (ref 3.5–5.1)
Sodium: 139 mEq/L (ref 135–145)

## 2017-11-01 LAB — CBC WITH DIFFERENTIAL/PLATELET
BASOS ABS: 0 10*3/uL (ref 0.0–0.1)
BASOS PCT: 0.6 % (ref 0.0–3.0)
Eosinophils Absolute: 0.2 10*3/uL (ref 0.0–0.7)
Eosinophils Relative: 3.6 % (ref 0.0–5.0)
HEMATOCRIT: 45.2 % (ref 39.0–52.0)
HEMOGLOBIN: 15.1 g/dL (ref 13.0–17.0)
LYMPHS PCT: 29 % (ref 12.0–46.0)
Lymphs Abs: 1.8 10*3/uL (ref 0.7–4.0)
MCHC: 33.5 g/dL (ref 30.0–36.0)
MCV: 88.7 fl (ref 78.0–100.0)
MONOS PCT: 7.6 % (ref 3.0–12.0)
Monocytes Absolute: 0.5 10*3/uL (ref 0.1–1.0)
Neutro Abs: 3.7 10*3/uL (ref 1.4–7.7)
Neutrophils Relative %: 59.2 % (ref 43.0–77.0)
Platelets: 249 10*3/uL (ref 150.0–400.0)
RBC: 5.09 Mil/uL (ref 4.22–5.81)
RDW: 13.1 % (ref 11.5–15.5)
WBC: 6.2 10*3/uL (ref 4.0–10.5)

## 2017-11-01 LAB — TSH: TSH: 0.79 u[IU]/mL (ref 0.35–4.50)

## 2017-11-01 LAB — HEPATIC FUNCTION PANEL
ALBUMIN: 4.3 g/dL (ref 3.5–5.2)
ALK PHOS: 64 U/L (ref 39–117)
ALT: 18 U/L (ref 0–53)
AST: 14 U/L (ref 0–37)
Bilirubin, Direct: 0.1 mg/dL (ref 0.0–0.3)
TOTAL PROTEIN: 7 g/dL (ref 6.0–8.3)
Total Bilirubin: 0.6 mg/dL (ref 0.2–1.2)

## 2017-11-01 LAB — LIPID PANEL
CHOL/HDL RATIO: 4
CHOLESTEROL: 173 mg/dL (ref 0–200)
HDL: 41.1 mg/dL (ref >39.00)
LDL Cholesterol: 111 mg/dL — ABNORMAL HIGH (ref 0–99)
NonHDL: 131.73
TRIGLYCERIDES: 103 mg/dL (ref 0.0–149.0)
VLDL: 20.6 mg/dL (ref 0.0–40.0)

## 2017-11-01 MED ORDER — ALBUTEROL SULFATE HFA 108 (90 BASE) MCG/ACT IN AERS: 2.0000 | INHALATION_SPRAY | Freq: Four times a day (QID) | RESPIRATORY_TRACT | 11 refills | Status: DC | PRN

## 2017-11-01 MED ORDER — DOXYCYCLINE HYCLATE 100 MG PO TABS: 100.0000 mg | ORAL_TABLET | Freq: Two times a day (BID) | ORAL | 5 refills | Status: DC

## 2017-11-01 NOTE — Assessment & Plan Note (Signed)
Doing well 

## 2017-11-01 NOTE — Assessment & Plan Note (Signed)
Doxy

## 2017-11-01 NOTE — Progress Notes (Signed)
Subjective:  Patient ID: John Boone, male    DOB: Apr 16, 1951  Age: 67 y.o. MRN: [id number]  CC: No chief complaint on file.   HPI John Boone presents for a well exam C/o sinus congestion C/o CP off and on - non-exertonal  Outpatient Medications Prior to Visit  Medication Sig Dispense Refill  . albuterol (PROVENTIL HFA) 108 (90 BASE) MCG/ACT inhaler Inhale 2 puffs into the lungs 4 (four) times daily as needed for shortness of breath. 1 Inhaler 6  . aspirin 325 MG tablet Take 325 mg by mouth daily.      . Cholecalciferol 1000 UNITS tablet Take 1,000 Units by mouth daily. Reported on 01/06/2016    . doxycycline (VIBRA-TABS) 100 MG tablet TAKE 1 TABLET (100 MG TOTAL) BY MOUTH 2 (TWO) TIMES DAILY. 60 tablet 5  . fluticasone (FLONASE) 50 MCG/ACT nasal spray 1-2 puffs each nostril once daily at bedtime 16 g 11  . metoprolol succinate (TOPROL-XL) 25 MG 24 hr tablet TAKE 1 TABLET BY MOUTH TWICE A DAY 60 tablet 11   No facility-administered medications prior to visit.     ROS Review of Systems  Constitutional: Negative for appetite change, fatigue and unexpected weight change.  HENT: Positive for congestion, rhinorrhea and sinus pressure. Negative for nosebleeds, sneezing, sore throat and trouble swallowing.   Eyes: Negative for itching and visual disturbance.  Respiratory: Negative for cough.   Cardiovascular: Positive for chest pain and palpitations. Negative for leg swelling.  Gastrointestinal: Negative for abdominal distention, blood in stool, diarrhea and nausea.  Genitourinary: Negative for frequency and hematuria.  Musculoskeletal: Negative for back pain, gait problem, joint swelling and neck pain.  Skin: Positive for rash.  Neurological: Negative for dizziness, tremors, speech difficulty and weakness.  Psychiatric/Behavioral: Negative for agitation, dysphoric mood, sleep disturbance and suicidal ideas. The patient is not nervous/anxious.     Objective:  BP 136/86 (BP  Location: Left Arm, Patient Position: Sitting, Cuff Size: Large)   Pulse 67   Temp 98.5 F (36.9 C) (Oral)   Ht 6\' 2"  (1.88 m)   Wt 233 lb (105.7 kg)   SpO2 98%   BMI 29.92 kg/m   BP Readings from Last 3 Encounters:  11/01/17 136/86  05/17/17 140/88  10/27/16 (!) 170/98    Wt Readings from Last 3 Encounters:  11/01/17 233 lb (105.7 kg)  05/17/17 234 lb (106.1 kg)  10/27/16 239 lb (108.4 kg)    Physical Exam  Constitutional: He is oriented to person, place, and time. He appears well-developed. No distress.  NAD  HENT:  Mouth/Throat: Oropharynx is clear and moist.  Eyes: Conjunctivae are normal. Pupils are equal, round, and reactive to light.  Neck: Normal range of motion. No JVD present. No thyromegaly present.  Cardiovascular: Normal rate, regular rhythm, normal heart sounds and intact distal pulses. Exam reveals no gallop and no friction rub.  No murmur heard. Pulmonary/Chest: Effort normal and breath sounds normal. No respiratory distress. He has no wheezes. He has no rales. He exhibits no tenderness.  Abdominal: Soft. Bowel sounds are normal. He exhibits no distension and no mass. There is no tenderness. There is no rebound and no guarding.  Genitourinary: Rectum normal and prostate normal. Rectal exam shows guaiac negative stool.  Musculoskeletal: Normal range of motion. He exhibits no edema or tenderness.  Lymphadenopathy:    He has no cervical adenopathy.  Neurological: He is alert and oriented to person, place, and time. He has normal reflexes. No cranial nerve  deficit. He exhibits normal muscle tone. He displays a negative Romberg sign. Coordination and gait normal.  Skin: Skin is warm and dry. Rash noted.  Psychiatric: He has a normal mood and affect. His behavior is normal. Judgment and thought content normal.  swollen nares Rhinophyma/rosacea  Procedure: EKG Indication: chest pain Impression: NSR. No acute changes.   Lab Results  Component Value Date   WBC  6.9 05/17/2017   HGB 16.0 05/17/2017   HCT 47.6 05/17/2017   PLT 218.0 05/17/2017   GLUCOSE 93 05/17/2017   CHOL 225 (H) 04/29/2017   TRIG 141.0 04/29/2017   HDL 54.10 04/29/2017   LDLCALC 143 (H) 04/29/2017   ALT 17 05/17/2017   AST 15 05/17/2017   NA 139 05/17/2017   K 4.4 05/17/2017   CL 104 05/17/2017   CREATININE 1.03 05/17/2017   BUN 20 05/17/2017   CO2 28 05/17/2017   TSH 1.33 04/29/2017   PSA 1.39 04/29/2017    Ct Abdomen Pelvis W Contrast  Result Date: 05/17/2017 CLINICAL DATA:  Abdominal pain, fever, right lower quadrant pain EXAM: CT ABDOMEN AND PELVIS WITH CONTRAST TECHNIQUE: Multidetector CT imaging of the abdomen and pelvis was performed using the standard protocol following bolus administration of intravenous contrast. CONTRAST:  160mL ISOVUE-300 IOPAMIDOL (ISOVUE-300) INJECTION 61% COMPARISON:  06/15/2010, chest CT 06/19/2012 FINDINGS: Lower chest: Calcified bilateral pleural plaques at the lung bases. No confluent airspace opacity. Heart is normal size. Hepatobiliary: Fatty infiltration of the liver. No focal abnormality or biliary ductal dilatation. Gallbladder unremarkable. Pancreas: No focal abnormality or ductal dilatation. Spleen: No focal abnormality.  Normal size. Adrenals/Urinary Tract: Small exophytic cyst off the upper pole of the right kidney. No stones or hydronephrosis. Adrenal glands and urinary bladder unremarkable. Stomach/Bowel: Stomach, large and small bowel grossly unremarkable. Appendix is normal. Vascular/Lymphatic: Aortic and iliac calcifications. No aneurysm or adenopathy. Reproductive: Mild prominence of the prostate with central calcifications. Other: No free fluid or free air. Musculoskeletal: No acute bony abnormality. IMPRESSION: Partially calcified pleural plaques in the lung bases bilaterally. These were seen on prior chest CT from 2013. 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 Recommend clinical correlation. Diffuse fatty  infiltration of the liver. Normal appendix. Aortoiliac atherosclerosis. Mildly prominent prostate. Electronically Signed   By: Rolm Baptise M.D.   On: 05/17/2017 11:39    Assessment & Plan:   There are no diagnoses linked to this encounter. I am having Alyson Locket. Depasquale maintain his aspirin, Cholecalciferol, albuterol, fluticasone, doxycycline, and metoprolol succinate.  No orders of the defined types were placed in this encounter.    Follow-up: No Follow-up on file.  Walker Kehr, MD

## 2017-11-01 NOTE — Assessment & Plan Note (Signed)
Toprol XL, ASA

## 2017-11-01 NOTE — Assessment & Plan Note (Signed)
We discussed age appropriate health related issues, including available/recomended screening tests and vaccinations. We discussed a need for adhering to healthy diet and exercise. Labs were ordered to be later reviewed . All questions were answered.   

## 2017-11-01 NOTE — Assessment & Plan Note (Signed)
Worse ?ENT ref - Dr Rosen ?

## 2017-11-01 NOTE — Assessment & Plan Note (Addendum)
EKG, CXR Stress test vs card ref - pt declined

## 2017-11-08 DIAGNOSIS — K219 Gastro-esophageal reflux disease without esophagitis: Secondary | ICD-10-CM | POA: Insufficient documentation

## 2018-03-07 DIAGNOSIS — R0982 Postnasal drip: Secondary | ICD-10-CM | POA: Insufficient documentation

## 2018-04-09 ENCOUNTER — Emergency Department (HOSPITAL_COMMUNITY)
Admission: EM | Admit: 2018-04-09 | Discharge: 2018-04-09 | Disposition: A | Discharge status: Home / Self Care | Payer: 59 | Attending: Emergency Medicine | Admitting: Emergency Medicine

## 2018-04-09 ENCOUNTER — Encounter (HOSPITAL_COMMUNITY): Payer: Self-pay | Admitting: Emergency Medicine

## 2018-04-09 ENCOUNTER — Emergency Department (HOSPITAL_COMMUNITY): Payer: 59

## 2018-04-09 DIAGNOSIS — W240XXA Contact with lifting devices, not elsewhere classified, initial encounter: Secondary | ICD-10-CM | POA: Diagnosis not present

## 2018-04-09 DIAGNOSIS — J45909 Unspecified asthma, uncomplicated: Secondary | ICD-10-CM | POA: Insufficient documentation

## 2018-04-09 DIAGNOSIS — S0990XA Unspecified injury of head, initial encounter: Secondary | ICD-10-CM | POA: Diagnosis present

## 2018-04-09 DIAGNOSIS — Y9389 Activity, other specified: Secondary | ICD-10-CM | POA: Insufficient documentation

## 2018-04-09 DIAGNOSIS — Y998 Other external cause status: Secondary | ICD-10-CM | POA: Insufficient documentation

## 2018-04-09 DIAGNOSIS — I10 Essential (primary) hypertension: Secondary | ICD-10-CM | POA: Diagnosis not present

## 2018-04-09 DIAGNOSIS — Z87891 Personal history of nicotine dependence: Secondary | ICD-10-CM | POA: Insufficient documentation

## 2018-04-09 DIAGNOSIS — S060X0A Concussion without loss of consciousness, initial encounter: Secondary | ICD-10-CM | POA: Diagnosis not present

## 2018-04-09 DIAGNOSIS — Y929 Unspecified place or not applicable: Secondary | ICD-10-CM | POA: Diagnosis not present

## 2018-04-09 NOTE — ED Notes (Signed)
Patient transported to CT 
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
## 2018-04-09 NOTE — Discharge Instructions (Signed)
You were evaluated in the Emergency Department and after careful evaluation, we did not find any emergent condition requiring admission or further testing in the hospital.  Your symptoms today seem to be due to a concussion.  Please practice mental and physical rest for the next 2 days and then slowly return to your daily routine.  Keep a close eye on your symptoms.  Please follow-up with your primary care provider within the next week for reassessment.  Please return to the Emergency Department if you experience any worsening of your condition.  We encourage you to follow up with a primary care provider.  Thank you for allowing [place] to be a part of your care.

## 2018-04-14 ENCOUNTER — Encounter: Payer: Self-pay | Admitting: Internal Medicine

## 2018-04-14 ENCOUNTER — Ambulatory Visit: Payer: 59 | Admitting: Internal Medicine

## 2018-04-14 DIAGNOSIS — S060X0D Concussion without loss of consciousness, subsequent encounter: Secondary | ICD-10-CM | POA: Diagnosis not present

## 2018-04-14 DIAGNOSIS — I10 Essential (primary) hypertension: Secondary | ICD-10-CM

## 2018-04-14 DIAGNOSIS — S060X0A Concussion without loss of consciousness, initial encounter: Secondary | ICD-10-CM | POA: Insufficient documentation

## 2018-04-14 MED ORDER — METOPROLOL SUCCINATE ER 25 MG PO TB24: 25.0000 mg | ORAL_TABLET | Freq: Two times a day (BID) | ORAL | 11 refills | Status: DC

## 2018-04-14 NOTE — Assessment & Plan Note (Signed)
To work on Monday if [employer]
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
## 2018-04-14 NOTE — Assessment & Plan Note (Signed)
Toprol renewed

## 2018-05-04 ENCOUNTER — Ambulatory Visit: Payer: 59 | Admitting: Internal Medicine

## 2018-05-08 ENCOUNTER — Ambulatory Visit: Payer: 59 | Admitting: Internal Medicine

## 2018-05-10 ENCOUNTER — Encounter: Payer: Self-pay | Admitting: Internal Medicine

## 2018-05-10 ENCOUNTER — Ambulatory Visit (INDEPENDENT_AMBULATORY_CARE_PROVIDER_SITE_OTHER): Payer: 59 | Admitting: Internal Medicine

## 2018-05-10 ENCOUNTER — Other Ambulatory Visit: Payer: 59

## 2018-05-10 ENCOUNTER — Ambulatory Visit (INDEPENDENT_AMBULATORY_CARE_PROVIDER_SITE_OTHER)
Admission: RE | Admit: 2018-05-10 | Discharge: 2018-05-10 | Disposition: A | Discharge status: Home / Self Care | Payer: 59 | Source: Ambulatory Visit | Attending: Internal Medicine | Admitting: Internal Medicine

## 2018-05-10 ENCOUNTER — Other Ambulatory Visit (INDEPENDENT_AMBULATORY_CARE_PROVIDER_SITE_OTHER): Payer: 59

## 2018-05-10 VITALS — BP 116/76 | HR 65 | Temp 97.8°F | Ht 74.0 in | Wt 224.0 lb

## 2018-05-10 DIAGNOSIS — R350 Frequency of micturition: Secondary | ICD-10-CM

## 2018-05-10 DIAGNOSIS — I10 Essential (primary) hypertension: Secondary | ICD-10-CM

## 2018-05-10 DIAGNOSIS — R109 Unspecified abdominal pain: Secondary | ICD-10-CM

## 2018-05-10 LAB — CBC WITH DIFFERENTIAL/PLATELET
BASOS PCT: 0.7 % (ref 0.0–3.0)
Basophils Absolute: 0 10*3/uL (ref 0.0–0.1)
EOS PCT: 4.8 % (ref 0.0–5.0)
Eosinophils Absolute: 0.3 10*3/uL (ref 0.0–0.7)
HCT: 44.5 % (ref 39.0–52.0)
HEMOGLOBIN: 15 g/dL (ref 13.0–17.0)
Lymphocytes Relative: 28.5 % (ref 12.0–46.0)
Lymphs Abs: 1.9 10*3/uL (ref 0.7–4.0)
MCHC: 33.6 g/dL (ref 30.0–36.0)
MCV: 86.5 fl (ref 78.0–100.0)
Monocytes Absolute: 0.6 10*3/uL (ref 0.1–1.0)
Monocytes Relative: 8.6 % (ref 3.0–12.0)
Neutro Abs: 3.8 10*3/uL (ref 1.4–7.7)
Neutrophils Relative %: 57.4 % (ref 43.0–77.0)
Platelets: 217 10*3/uL (ref 150.0–400.0)
RBC: 5.15 Mil/uL (ref 4.22–5.81)
RDW: 13.9 % (ref 11.5–15.5)
WBC: 6.6 10*3/uL (ref 4.0–10.5)

## 2018-05-10 LAB — URINALYSIS, ROUTINE W REFLEX MICROSCOPIC
BILIRUBIN URINE: NEGATIVE
Ketones, ur: NEGATIVE
Leukocytes, UA: NEGATIVE
NITRITE: NEGATIVE
Specific Gravity, Urine: 1.025 (ref 1.000–1.030)
Urine Glucose: NEGATIVE
Urobilinogen, UA: 0.2 (ref 0.0–1.0)
pH: 5.5 (ref 5.0–8.0)

## 2018-05-10 LAB — BASIC METABOLIC PANEL
BUN: 21 mg/dL (ref 6–23)
CALCIUM: 9.2 mg/dL (ref 8.4–10.5)
CO2: 32 meq/L (ref 19–32)
Chloride: 105 mEq/L (ref 96–112)
Creatinine, Ser: 0.84 mg/dL (ref 0.40–1.50)
GFR: 97.17 mL/min (ref >60.00)
GLUCOSE: 88 mg/dL (ref 70–99)
POTASSIUM: 4 meq/L (ref 3.5–5.1)
Sodium: 141 mEq/L (ref 135–145)

## 2018-05-10 NOTE — Progress Notes (Signed)
Subjective:    Patient ID: John Boone, male    DOB: Nov 20, 1950, 67 y.o.   MRN: [id number]  HPI  Here for acute visit with c/o 3-4 days onset mod to severe intermittent left flank pain with some radiation to the LLQ, assoc with urinary freq and dark urine coloring, no fever, BRB or n/v.  Pt denies chest pain, increased sob or doe, wheezing, orthopnea, PND, increased LE swelling, palpitations, dizziness or syncope.   Pt denies polydipsia, polyuria Past Medical History:  Diagnosis Date  . A-fib [facility])    Question history of one episode  . Allergic rhinitis   . Asthma   . HTN (hypertension)   . Nummular eczema    [facility]  . Rosacea   . SVT (supraventricular tachycardia) (HCC)    Past Surgical History:  Procedure Laterality Date  . LIPOMA EXCISION    . SINUS SURGERY WITH INSTATRAK    . TONSILLECTOMY      reports that he quit smoking about 23 years ago. His smoking use included cigarettes. He started smoking about 53 years ago. He has a 30.00 pack-year smoking history. He has never used smokeless tobacco. He reports that he drinks about 14.0 standard drinks of alcohol per week. He reports that he does not use drugs. family history includes COPD in his father; Cancer (age of onset: 13) in his mother; Colon polyps in his father; Congestive Heart Failure in his father; Heart attack in his mother; Lymphoma in his mother. Allergies  Allergen Reactions  . Cardizem [Diltiazem Hcl]     tired  . Levaquin [Levofloxacin]     Myalgias   . Losartan     Felt bad, sweaty  . Meloxicam     fatigue  . Pravastatin     Heart flutter  . Verapamil     Not an allergic reaction   Current Outpatient Medications on File Prior to Visit  Medication Sig Dispense Refill  . albuterol (PROVENTIL HFA) 108 (90 Base) MCG/ACT inhaler Inhale 2 puffs into the lungs 4 (four) times daily as needed for shortness of breath. 1 Inhaler 11  . aspirin 325 MG tablet Take 325 mg by mouth daily.      .  Cholecalciferol 1000 UNITS tablet Take 1,000 Units by mouth daily. Reported on 01/06/2016    . doxycycline (VIBRA-TABS) 100 MG tablet Take 1 tablet (100 mg total) by mouth 2 (two) times daily. 60 tablet 5  . fluticasone (FLONASE) 50 MCG/ACT nasal spray 1-2 puffs each nostril once daily at bedtime 16 g 11  . metoprolol succinate (TOPROL-XL) 25 MG 24 hr tablet Take 1 tablet (25 mg total) by mouth 2 (two) times daily. 60 tablet 11  . raNITIdine HCl (ZANTAC PO) Take by mouth.     No current facility-administered medications on file prior to visit.    Review of Systems  Constitutional: Negative for other unusual diaphoresis or sweats HENT: Negative for ear discharge or swelling Eyes: Negative for other worsening visual disturbances Respiratory: Negative for stridor or other swelling  Gastrointestinal: Negative for worsening distension or other blood Genitourinary: Negative for retention or other urinary change Musculoskeletal: Negative for other MSK pain or swelling Skin: Negative for color change or other new lesions Neurological: Negative for worsening tremors and other numbness  Psychiatric/Behavioral: Negative for worsening agitation or other fatigue All other system neg per pt    Objective:   Physical Exam BP 116/76   Pulse 65   Temp 97.8 F (36.6 C) (  Oral)   Ht 6\' 2"  (1.88 m)   Wt 224 lb (101.6 kg)   SpO2 97%   BMI 28.76 kg/m  VS noted,  Constitutional: Pt appears in NAD HENT: Head: NCAT.  Right Ear: External ear normal.  Left Ear: External ear normal.  Eyes: . Pupils are equal, round, and reactive to light. Conjunctivae and EOM are normal Nose: without d/c or deformity Neck: Neck supple. Gross normal ROM Cardiovascular: Normal rate and regular rhythm.   Pulmonary/Chest: Effort normal and breath sounds without rales or wheezing.  Abd:  Soft, NT, ND, + BS, no organomegaly, no flank tender Neurological: Pt is alert. At baseline orientation, motor grossly intact Skin: Skin is  warm. No rashes, other new lesions, no LE edema Psychiatric: Pt behavior is normal without agitation  No other exam findings    Assessment & Plan:

## 2018-05-10 NOTE — Assessment & Plan Note (Signed)
stable overall by history and exam, and pt to continue medical treatment as before,  to f/u any worsening symptoms or concerns 

## 2018-05-10 NOTE — Assessment & Plan Note (Signed)
For urine studies and labs as ordered, r/o infection, hold on specific tx pending results

## 2018-05-10 NOTE — Assessment & Plan Note (Addendum)
High suspicion for left renal stone, also for CT stone protocol  Note:  Total time for pt hx, exam, review of record with pt in the room, determination of diagnoses and plan for further eval and tx is > 40 min, with over 50% spent in coordination and counseling of patient including the differential dx, tx, further evaluation and other management of left flank pain, urinary frequency and HTN

## 2018-05-10 NOTE — Patient Instructions (Signed)
Please continue all other medications as before, and refills have been done if requested.  Please have the pharmacy call with any other refills you may need.  Please keep your appointments with your specialists as you may have planned  You will be contacted regarding the referral for: CT scan - to see PCCs now  Please go to the LAB in the Basement (turn left off the elevator) for the tests to be done today  You will be contacted by phone if any changes need to be made immediately.  Otherwise, you will receive a letter about your results with an explanation, but please check with MyChart first.  Please remember to sign up for MyChart if you have not done so, as this will be important to you in the future with finding out test results, communicating by private email, and scheduling acute appointments online when needed.

## 2018-05-11 ENCOUNTER — Telehealth: Payer: Self-pay

## 2018-05-11 ENCOUNTER — Other Ambulatory Visit: Payer: Self-pay | Admitting: Internal Medicine

## 2018-05-11 DIAGNOSIS — N200 Calculus of kidney: Secondary | ICD-10-CM

## 2018-05-11 LAB — URINE CULTURE
MICRO NUMBER:: 91087931
Result:: NO GROWTH
SPECIMEN QUALITY: ADEQUATE

## 2018-05-11 NOTE — Telephone Encounter (Signed)
-----   Message from Biagio Borg, MD sent at 05/11/2018  1:07 PM EDT ----- Left message on MyChart, pt to cont same tx except  The test results show that your current treatment is OK, except the CT scan did show a small kidney stone on the left sided as we suspected.  This is small enough to pass on your own, and there is no obstruction. But we will refer to Urology as well. Redmond Baseman to please inform pt, I will do referral

## 2018-05-11 NOTE — Telephone Encounter (Signed)
Pt has viewed results via MyChart  

## 2018-06-07 ENCOUNTER — Encounter: Payer: Self-pay | Admitting: Internal Medicine

## 2018-06-07 ENCOUNTER — Ambulatory Visit: Payer: 59 | Admitting: Internal Medicine

## 2018-06-07 VITALS — BP 122/78 | HR 71 | Temp 98.6°F | Ht 74.0 in | Wt 228.0 lb

## 2018-06-07 DIAGNOSIS — L719 Rosacea, unspecified: Secondary | ICD-10-CM | POA: Diagnosis not present

## 2018-06-07 DIAGNOSIS — I480 Paroxysmal atrial fibrillation: Secondary | ICD-10-CM

## 2018-06-07 DIAGNOSIS — R109 Unspecified abdominal pain: Secondary | ICD-10-CM

## 2018-06-07 DIAGNOSIS — R10A2 Flank pain, left side: Secondary | ICD-10-CM

## 2018-06-07 DIAGNOSIS — Z23 Encounter for immunization: Secondary | ICD-10-CM | POA: Diagnosis not present

## 2018-06-07 DIAGNOSIS — I10 Essential (primary) hypertension: Secondary | ICD-10-CM

## 2018-06-07 MED ORDER — KETOROLAC TROMETHAMINE 10 MG PO TABS: 10.0000 mg | ORAL_TABLET | Freq: Four times a day (QID) | ORAL | 0 refills | Status: DC | PRN

## 2018-06-07 MED ORDER — HYDROCODONE-ACETAMINOPHEN 7.5-325 MG PO TABS: 1.0000 | ORAL_TABLET | ORAL | 0 refills | Status: DC | PRN

## 2018-06-07 MED ORDER — DOXYCYCLINE HYCLATE 100 MG PO TABS: 100.0000 mg | ORAL_TABLET | Freq: Two times a day (BID) | ORAL | 5 refills | Status: DC

## 2018-06-07 NOTE — Progress Notes (Signed)
Subjective:  Patient ID: John Boone, male    DOB: [birth year]  Age: 67 y.o. MRN: [id number]  CC: No chief complaint on file.   HPI John Boone presents for acne, palpitations, kidney stones  Outpatient Medications Prior to Visit  Medication Sig Dispense Refill  . albuterol (PROVENTIL HFA) 108 (90 Base) MCG/ACT inhaler Inhale 2 puffs into the lungs 4 (four) times daily as needed for shortness of breath. 1 Inhaler 11  . aspirin 325 MG tablet Take 325 mg by mouth daily.      . Cholecalciferol 1000 UNITS tablet Take 1,000 Units by mouth daily. Reported on 01/06/2016    . doxycycline (VIBRA-TABS) 100 MG tablet Take 1 tablet (100 mg total) by mouth 2 (two) times daily. 60 tablet 5  . metoprolol succinate (TOPROL-XL) 25 MG 24 hr tablet Take 1 tablet (25 mg total) by mouth 2 (two) times daily. 60 tablet 11  . omeprazole (PRILOSEC) 40 MG capsule Take 40 mg by mouth daily.    . fluticasone (FLONASE) 50 MCG/ACT nasal spray 1-2 puffs each nostril once daily at bedtime 16 g 11  . raNITIdine HCl (ZANTAC PO) Take by mouth.     No facility-administered medications prior to visit.     ROS: Review of Systems  Constitutional: Negative for appetite change, fatigue and unexpected weight change.  HENT: Negative for congestion, nosebleeds, sneezing, sore throat and trouble swallowing.   Eyes: Negative for itching and visual disturbance.  Respiratory: Negative for cough.   Cardiovascular: Negative for chest pain, palpitations and leg swelling.  Gastrointestinal: Negative for abdominal distention, blood in stool, diarrhea and nausea.  Genitourinary: Negative for frequency and hematuria.  Musculoskeletal: Negative for back pain, gait problem, joint swelling and neck pain.  Skin: Positive for rash.  Neurological: Negative for dizziness, tremors, speech difficulty and weakness.  Psychiatric/Behavioral: Negative for agitation, dysphoric mood and sleep disturbance. The patient is not nervous/anxious.      Objective:  BP 122/78 (BP Location: Left Arm, Patient Position: Sitting, Cuff Size: Normal)   Pulse 71   Temp 98.6 F (37 C) (Oral)   Ht 6\' 2"  (1.88 m)   Wt 228 lb (103.4 kg)   SpO2 97%   BMI 29.27 kg/m   BP Readings from Last 3 Encounters:  06/07/18 122/78  05/10/18 116/76  04/14/18 134/82    Wt Readings from Last 3 Encounters:  06/07/18 228 lb (103.4 kg)  05/10/18 224 lb (101.6 kg)  04/14/18 222 lb (100.7 kg)    Physical Exam  Constitutional: He is oriented to person, place, and time. He appears well-developed. No distress.  NAD  HENT:  Mouth/Throat: Oropharynx is clear and moist.  Eyes: Pupils are equal, round, and reactive to light. Conjunctivae are normal.  Neck: Normal range of motion. No JVD present. No thyromegaly present.  Cardiovascular: Normal rate, regular rhythm, normal heart sounds and intact distal pulses. Exam reveals no gallop and no friction rub.  No murmur heard. Pulmonary/Chest: Effort normal and breath sounds normal. No respiratory distress. He has no wheezes. He has no rales. He exhibits no tenderness.  Abdominal: Soft. Bowel sounds are normal. He exhibits no distension and no mass. There is no tenderness. There is no rebound and no guarding.  Musculoskeletal: Normal range of motion. He exhibits no edema or tenderness.  Lymphadenopathy:    He has no cervical adenopathy.  Neurological: He is alert and oriented to person, place, and time. He has normal reflexes. No cranial nerve deficit. He exhibits  normal muscle tone. He displays a negative Romberg sign. Coordination and gait normal.  Skin: Skin is warm and dry. No rash noted.  Psychiatric: He has a normal mood and affect. His behavior is normal. Judgment and thought content normal.    Lab Results  Component Value Date   WBC 6.6 05/10/2018   HGB 15.0 05/10/2018   HCT 44.5 05/10/2018   PLT 217.0 05/10/2018   GLUCOSE 88 05/10/2018   CHOL 173 11/01/2017   TRIG 103.0 11/01/2017   HDL 41.10  11/01/2017   LDLCALC 111 (H) 11/01/2017   ALT 18 11/01/2017   AST 14 11/01/2017   NA 141 05/10/2018   K 4.0 05/10/2018   CL 105 05/10/2018   CREATININE 0.84 05/10/2018   BUN 21 05/10/2018   CO2 32 05/10/2018   TSH 0.79 11/01/2017   PSA 1.39 04/29/2017    Ct Renal Stone Study  Result Date: 05/11/2018 CLINICAL DATA:  Left flank pain EXAM: CT ABDOMEN AND PELVIS WITHOUT CONTRAST TECHNIQUE: Multidetector CT imaging of the abdomen and pelvis was performed following the standard protocol without IV contrast. COMPARISON:  05/17/2017 FINDINGS: Lower chest: Calcified pleural plaque is noted at the lung bases, stable. Lingular and right middle lobe scarring. No effusions. Heart is normal size. Hepatobiliary: No focal hepatic abnormality. Gallbladder unremarkable. Pancreas: No focal abnormality or ductal dilatation. Spleen: No focal abnormality.  Normal size. Adrenals/Urinary Tract: No renal or ureteral stones. No hydronephrosis. Mild bilateral perinephric stranding is stable since prior study. There is a left UVJ stone measuring approximately 4 mm, nonobstructing. Adrenal glands and urinary bladder unremarkable. Stomach/Bowel: Stomach, large and small bowel grossly unremarkable. Vascular/Lymphatic: Aortic atherosclerosis. No enlarged abdominal or pelvic lymph nodes. Reproductive: Mild central prostate calcifications and prominence of the prostate. Other: No free fluid or free air. Musculoskeletal: No acute bony abnormality. IMPRESSION: 4 mm left UVJ stone, nonobstructing.  No hydronephrosis. Aortic atherosclerosis. Mildly prominent prostate. Bilateral calcified pleural plaques, stable. Electronically Signed   By: Rolm Baptise M.D.   On: 05/11/2018 08:15    Assessment & Plan:   There are no diagnoses linked to this encounter.   No orders of the defined types were placed in this encounter.    Follow-up: No follow-ups on file.  Walker Kehr, MD

## 2018-06-07 NOTE — Assessment & Plan Note (Signed)
Urol ref Toradol, Norco if relapsed

## 2018-06-07 NOTE — Assessment & Plan Note (Signed)
Doxy

## 2018-06-07 NOTE — Assessment & Plan Note (Signed)
Toprol XL 

## 2018-06-07 NOTE — Assessment & Plan Note (Signed)
No relapse 

## 2018-06-07 NOTE — Addendum Note (Signed)
Addended by: Karren Cobble on: 06/07/2018 03:52 PM   Modules accepted: Orders

## 2018-06-30 ENCOUNTER — Encounter: Payer: Self-pay | Admitting: Internal Medicine

## 2018-08-01 ENCOUNTER — Other Ambulatory Visit: Payer: Self-pay | Admitting: Dermatology

## 2018-08-01 DIAGNOSIS — D229 Melanocytic nevi, unspecified: Secondary | ICD-10-CM

## 2018-08-01 HISTORY — DX: Melanocytic nevi, unspecified: D22.9

## 2018-09-14 ENCOUNTER — Other Ambulatory Visit: Payer: Self-pay | Admitting: Dermatology

## 2018-11-06 ENCOUNTER — Encounter: Payer: 59 | Admitting: Internal Medicine

## 2018-11-20 ENCOUNTER — Ambulatory Visit: Payer: 59 | Admitting: Internal Medicine

## 2018-12-08 ENCOUNTER — Other Ambulatory Visit: Payer: Self-pay | Admitting: Internal Medicine

## 2019-03-19 ENCOUNTER — Encounter: Payer: Self-pay | Admitting: *Deleted

## 2019-05-04 ENCOUNTER — Other Ambulatory Visit: Payer: Self-pay | Admitting: Internal Medicine

## 2019-05-29 ENCOUNTER — Other Ambulatory Visit: Payer: Self-pay | Admitting: Internal Medicine

## 2019-06-26 ENCOUNTER — Other Ambulatory Visit: Payer: Self-pay | Admitting: Internal Medicine

## 2019-09-23 ENCOUNTER — Other Ambulatory Visit: Payer: Self-pay | Admitting: Internal Medicine

## 2019-09-27 ENCOUNTER — Other Ambulatory Visit: Payer: Self-pay

## 2019-09-27 ENCOUNTER — Encounter: Payer: Self-pay | Admitting: Internal Medicine

## 2019-09-27 ENCOUNTER — Ambulatory Visit (INDEPENDENT_AMBULATORY_CARE_PROVIDER_SITE_OTHER): Payer: Medicare Other | Admitting: Internal Medicine

## 2019-09-27 VITALS — BP 178/96 | HR 92 | Temp 98.5°F | Ht 74.0 in | Wt 240.0 lb

## 2019-09-27 DIAGNOSIS — I2583 Coronary atherosclerosis due to lipid rich plaque: Secondary | ICD-10-CM

## 2019-09-27 DIAGNOSIS — M25511 Pain in right shoulder: Secondary | ICD-10-CM | POA: Diagnosis not present

## 2019-09-27 DIAGNOSIS — I480 Paroxysmal atrial fibrillation: Secondary | ICD-10-CM | POA: Diagnosis not present

## 2019-09-27 DIAGNOSIS — N320 Bladder-neck obstruction: Secondary | ICD-10-CM

## 2019-09-27 DIAGNOSIS — I10 Essential (primary) hypertension: Secondary | ICD-10-CM

## 2019-09-27 DIAGNOSIS — I2510 Atherosclerotic heart disease of native coronary artery without angina pectoris: Secondary | ICD-10-CM | POA: Diagnosis not present

## 2019-09-27 DIAGNOSIS — E785 Hyperlipidemia, unspecified: Secondary | ICD-10-CM

## 2019-09-27 MED ORDER — DOXYCYCLINE HYCLATE 100 MG PO TABS: 100.0000 mg | ORAL_TABLET | Freq: Two times a day (BID) | ORAL | 3 refills | Status: DC

## 2019-09-27 MED ORDER — OMEPRAZOLE 40 MG PO CPDR: 40.0000 mg | DELAYED_RELEASE_CAPSULE | Freq: Every day | ORAL | 3 refills | Status: DC

## 2019-09-27 MED ORDER — METOPROLOL SUCCINATE ER 25 MG PO TB24: 25.0000 mg | ORAL_TABLET | Freq: Two times a day (BID) | ORAL | 3 refills | Status: DC

## 2019-09-27 MED ORDER — MELOXICAM 15 MG PO TABS: 15.0000 mg | ORAL_TABLET | Freq: Every day | ORAL | 0 refills | Status: DC

## 2019-09-27 MED ORDER — VITAMIN D3 50 MCG (2000 UT) PO CAPS: 2000.0000 [IU] | ORAL_CAPSULE | Freq: Every day | ORAL | 3 refills | Status: AC

## 2019-09-27 NOTE — Assessment & Plan Note (Addendum)
Multivessel coronary artery atherosclerosis. Statin intolerant Toprol, ASA  Ref to Dr Stanford Breed

## 2019-09-27 NOTE — Assessment & Plan Note (Signed)
NAS diet, loose wt Toprol XL

## 2019-09-27 NOTE — Assessment & Plan Note (Signed)
Toprol XL, ASA

## 2019-09-27 NOTE — Progress Notes (Signed)
Subjective:  Patient ID: John Boone, male    DOB: 1950/10/06  Age: 69 y.o. MRN: [id number]  CC: No chief complaint on file.   HPI John Boone presents for HTN, OA, asthma, palpitations, GERD f/u  Outpatient Medications Prior to Visit  Medication Sig Dispense Refill  . albuterol (PROVENTIL HFA) 108 (90 Base) MCG/ACT inhaler Inhale 2 puffs into the lungs 4 (four) times daily as needed for shortness of breath. 1 Inhaler 11  . aspirin 325 MG tablet Take 325 mg by mouth daily.      . Cholecalciferol 1000 UNITS tablet Take 1,000 Units by mouth daily. Reported on 01/06/2016    . HYDROcodone-acetaminophen (NORCO) 7.5-325 MG tablet Take 1 tablet by mouth every 4 (four) hours as needed for severe pain (renal colic). 12 tablet 0  . ketorolac (TORADOL) 10 MG tablet Take 1 tablet (10 mg total) by mouth every 6 (six) hours as needed for severe pain (renal colic). 12 tablet 0  . metoprolol succinate (TOPROL-XL) 25 MG 24 hr tablet TAKE 1 TABLET BY MOUTH TWICE A DAY 60 tablet 2  . omeprazole (PRILOSEC) 40 MG capsule Take 40 mg by mouth daily.    Marland Kitchen doxycycline (VIBRA-TABS) 100 MG tablet TAKE 1 TABLET BY MOUTH TWICE A DAY 60 tablet 5   No facility-administered medications prior to visit.    ROS: Review of Systems  Objective:  BP (!) 178/96 (BP Location: Left Arm, Patient Position: Sitting, Cuff Size: Large)   Pulse 92   Temp 98.5 F (36.9 C) (Oral)   Ht 6\' 2"  (1.88 m)   Wt 240 lb (108.9 kg)   SpO2 97%   BMI 30.81 kg/m   BP Readings from Last 3 Encounters:  09/27/19 (!) 178/96  06/07/18 122/78  05/10/18 116/76    Wt Readings from Last 3 Encounters:  09/27/19 240 lb (108.9 kg)  06/07/18 228 lb (103.4 kg)  05/10/18 224 lb (101.6 kg)    Physical Exam  Lab Results  Component Value Date   WBC 6.6 05/10/2018   HGB 15.0 05/10/2018   HCT 44.5 05/10/2018   PLT 217.0 05/10/2018   GLUCOSE 88 05/10/2018   CHOL 173 11/01/2017   TRIG 103.0 11/01/2017   HDL 41.10 11/01/2017   LDLCALC  111 (H) 11/01/2017   ALT 18 11/01/2017   AST 14 11/01/2017   NA 141 05/10/2018   K 4.0 05/10/2018   CL 105 05/10/2018   CREATININE 0.84 05/10/2018   BUN 21 05/10/2018   CO2 32 05/10/2018   TSH 0.79 11/01/2017   PSA 1.39 04/29/2017    CT RENAL STONE STUDY  Result Date: 05/11/2018 CLINICAL DATA:  Left flank pain EXAM: CT ABDOMEN AND PELVIS WITHOUT CONTRAST TECHNIQUE: Multidetector CT imaging of the abdomen and pelvis was performed following the standard protocol without IV contrast. COMPARISON:  05/17/2017 FINDINGS: Lower chest: Calcified pleural plaque is noted at the lung bases, stable. Lingular and right middle lobe scarring. No effusions. Heart is normal size. Hepatobiliary: No focal hepatic abnormality. Gallbladder unremarkable. Pancreas: No focal abnormality or ductal dilatation. Spleen: No focal abnormality.  Normal size. Adrenals/Urinary Tract: No renal or ureteral stones. No hydronephrosis. Mild bilateral perinephric stranding is stable since prior study. There is a left UVJ stone measuring approximately 4 mm, nonobstructing. Adrenal glands and urinary bladder unremarkable. Stomach/Bowel: Stomach, large and small bowel grossly unremarkable. Vascular/Lymphatic: Aortic atherosclerosis. No enlarged abdominal or pelvic lymph nodes. Reproductive: Mild central prostate calcifications and prominence of the prostate. Other: No free  fluid or free air. Musculoskeletal: No acute bony abnormality. IMPRESSION: 4 mm left UVJ stone, nonobstructing.  No hydronephrosis. Aortic atherosclerosis. Mildly prominent prostate. Bilateral calcified pleural plaques, stable. Electronically Signed   By: Rolm Baptise M.D.   On: 05/11/2018 08:15    Assessment & Plan:   There are no diagnoses linked to this encounter.   No orders of the defined types were placed in this encounter.    Follow-up: No follow-ups on file.  Walker Kehr, MD

## 2019-09-27 NOTE — Patient Instructions (Signed)
Sign up for [employer] library ( via Libby app on your phone or your ipad). If you don't have a library card  - go to any library branch. They will set you up in 15 minutes. It is free. You can check out books to read and to listen, check out magazines and newspapers, movies etc.   

## 2019-09-27 NOTE — Assessment & Plan Note (Addendum)
L shoulder Declined PT ROM exercises Meloxicam

## 2019-10-16 NOTE — Progress Notes (Signed)
Referring-Aleksei Plotnikov MD Reason for referral-coronary artery disease and question atrial fibrillation  HPI: 69 year old male for evaluation of coronary artery disease and question atrial fibrillation at request of Lew Dawes MD. Patient seen previously but not since November 2013. The patient had a stress test in 1996 that was negative. He also had a monitor placed and was noted to have SVT at a rate of 227. 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 Ablation was discussed but the patient preferred medical therapy. Echocardiogram in 2009 showed normal LV function. Chest CT in October of 2013 that showed coronary atherosclerosis.  Nuclear study November 2013 showed ejection fraction 62%, soft tissue attenuation, no ischemia.  He denies dyspnea, chest pain, or syncope.  Occasional brief palpitations not sustained.  Current Outpatient Medications  Medication Sig Dispense Refill  . albuterol (PROVENTIL HFA) 108 (90 Base) MCG/ACT inhaler Inhale 2 puffs into the lungs 4 (four) times daily as needed for shortness of breath. 1 Inhaler 11  . aspirin 325 MG tablet Take 325 mg by mouth daily.      . Cholecalciferol (VITAMIN D3) 50 MCG (2000 UT) capsule Take 1 capsule (2,000 Units total) by mouth daily. 100 capsule 3  . doxycycline (VIBRA-TABS) 100 MG tablet Take 1 tablet (100 mg total) by mouth 2 (two) times daily. 180 tablet 3  . meloxicam (MOBIC) 15 MG tablet Take 1 tablet (15 mg total) by mouth daily. (Patient taking differently: Take 15 mg by mouth as needed. ) 90 tablet 0  . metoprolol succinate (TOPROL-XL) 25 MG 24 hr tablet Take 1 tablet (25 mg total) by mouth 2 (two) times daily. 180 tablet 3   No current facility-administered medications for this visit.    Allergies  Allergen Reactions  . Cardizem [Diltiazem Hcl]     tired  . Levaquin [Levofloxacin]     Myalgias   . Losartan     Felt bad, sweaty  . Meloxicam     fatigue  . Pravastatin     Heart  flutter  . Verapamil     Not an allergic reaction     Past Medical History:  Diagnosis Date  . Allergic rhinitis   . Asthma   . 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 HTN (hypertension)   . Nephrolithiasis   . Nummular eczema    [facility]  . Rosacea   . SVT (supraventricular tachycardia) (HCC)     Past Surgical History:  Procedure Laterality Date  . LIPOMA EXCISION    . SINUS SURGERY WITH INSTATRAK    . TONSILLECTOMY      Social History   Socioeconomic History  . Marital status: Married    Spouse name: Not on file  . Number of children: 2  . Years of education: Not on file  . Highest education level: Not on file  Occupational History  . Occupation: Help Desk Analyst    Employer: [employer]  Tobacco Use  . Smoking status: Former Smoker    Packs/day: 1.00    Years: 30.00    Pack years: 30.00    Types: Cigarettes    Start date: 08/30/1964    Quit date: 08/30/1994    Years since quitting: 25.1  . Smokeless tobacco: Never Used  . Tobacco comment: quit 1996  Substance and Sexual Activity  . Alcohol use: Yes    Alcohol/week: 14.0 standard drinks    Types: 14 Cans of beer per week  Comment: Occasional  . Drug use: No  . Sexual activity: Yes  Other Topics Concern  . Not on file  Social History Narrative   Regular Exercise-Yes   Works at [employer] as Production designer, theatre/television/film.   Married.         Social Determinants of Health   Financial Resource Strain:   . Difficulty of Paying Living Expenses: Not on file  Food Insecurity:   . Worried About Charity fundraiser in the Last Year: Not on file  . Ran Out of Food in the Last Year: Not on file  Transportation Needs:   . Lack of Transportation (Medical): Not on file  . Lack of Transportation (Non-Medical): Not on file  Physical Activity:   . Days of Exercise per Week: Not on file  . Minutes of Exercise per Session: Not on file  Stress:   . Feeling of Stress : Not on file   Social Connections:   . Frequency of Communication with Friends and Family: Not on file  . Frequency of Social Gatherings with Friends and Family: Not on file  . Attends Religious Services: Not on file  . Active Member of Clubs or Organizations: Not on file  . Attends Archivist Meetings: Not on file  . Marital Status: Not on file  Intimate Partner Violence:   . Fear of Current or Ex-Partner: Not on file  . Emotionally Abused: Not on file  . Physically Abused: Not on file  . Sexually Abused: Not on file    Family History  Problem Relation Age of Onset  . Lymphoma Mother        deceased  . Heart attack Mother        deceased  . Cancer Mother 71       lymphoma  . Colon polyps Father   . COPD Father   . Congestive Heart Failure Father   . Colon cancer Neg Hx     ROS: no fevers or chills, productive cough, hemoptysis, dysphasia, odynophagia, melena, hematochezia, dysuria, hematuria, rash, seizure activity, orthopnea, PND, pedal edema, claudication. Remaining systems are negative.  Physical Exam:   Blood pressure (!) 146/110, pulse 86, temperature (!) 97.2 F (36.2 C), height 6\' 2"  (1.88 m), weight 239 lb (108.4 kg).  General:  Well developed/well nourished in NAD Skin warm/dry Patient not depressed No peripheral clubbing Back-normal HEENT-normal/normal eyelids Neck supple/normal carotid upstroke bilaterally; no bruits; no JVD; no thyromegaly chest - CTA/ normal expansion CV - RRR/normal S1 and S2; no murmurs, rubs or gallops;  PMI nondisplaced Abdomen -NT/ND, no HSM, no mass, + bowel sounds, no bruit 2+ femoral pulses, no bruits Ext-no edema, chords, 2+ DP Neuro-grossly nonfocal  ECG -sinus rhythm, right bundle branch block.  Personally reviewed  A/P  1 history of supraventricular tachycardia-patient has occasional brief flutters but no sustained palpitations.  Symptoms seem reasonably well controlled.  We will continue with beta-blocker.  If he has more  frequent episodes in the future or more sustained we could consider referral for ablation at that time.  2 history of coronary artery disease-based on coronary calcification on CT scan.  Previous nuclear study showed no ischemia.  He is exercising riding the stationary bike 30 minutes at a time with no chest pain.  Plan medical therapy.  Decrease aspirin to 81 mg daily.  He declines statin.  3 hypertension-blood pressure elevated.  Increase metoprolol to 50 mg twice daily and follow.  Goal systolic blood pressure [id number] and diastolic 85.  4 question history of atrial fibrillation-I can find no documentation of this.  Likely related to previous diagnosis of SVT.  Kirk Ruths, MD

## 2019-10-22 ENCOUNTER — Ambulatory Visit (INDEPENDENT_AMBULATORY_CARE_PROVIDER_SITE_OTHER): Payer: Medicare Other | Admitting: Cardiology

## 2019-10-22 ENCOUNTER — Other Ambulatory Visit: Payer: Self-pay

## 2019-10-22 ENCOUNTER — Encounter: Payer: Self-pay | Admitting: Cardiology

## 2019-10-22 VITALS — BP 146/110 | HR 86 | Temp 97.2°F | Ht 74.0 in | Wt 239.0 lb

## 2019-10-22 DIAGNOSIS — I471 Supraventricular tachycardia: Secondary | ICD-10-CM | POA: Diagnosis not present

## 2019-10-22 DIAGNOSIS — I2510 Atherosclerotic heart disease of native coronary artery without angina pectoris: Secondary | ICD-10-CM | POA: Diagnosis not present

## 2019-10-22 DIAGNOSIS — I10 Essential (primary) hypertension: Secondary | ICD-10-CM

## 2019-10-22 MED ORDER — METOPROLOL SUCCINATE ER 50 MG PO TB24: 50.0000 mg | ORAL_TABLET | Freq: Two times a day (BID) | ORAL | 3 refills | Status: DC

## 2019-10-22 MED ORDER — ASPIRIN EC 81 MG PO TBEC: 81.0000 mg | DELAYED_RELEASE_TABLET | Freq: Every day | ORAL | 3 refills | Status: AC

## 2019-10-22 NOTE — Patient Instructions (Signed)
Medication Instructions:  DECREASE ASPIRIN TO 81 MG ONCE DAILY  INCREASE METOPROLOL TO 50 MG TWICE DAILY= 2 OF THE 25 MG TABLETS TWICE DAILY  *If you need a refill on your cardiac medications before your next appointment, please call your pharmacy*  Lab Work: If you have labs (blood work) drawn today and your tests are completely normal, you will receive your results only by: Marland Kitchen MyChart Message (if you have MyChart) OR . A paper copy in the mail If you have any lab test that is abnormal or we need to change your treatment, we will call you to review the results.  Follow-Up: At [facility], you and your health needs are our priority.  As part of our continuing mission to provide you with exceptional heart care, we have created designated Provider Care Teams.  These Care Teams include your primary Cardiologist (physician) and Advanced Practice Providers (APPs -  Physician Assistants and Nurse Practitioners) who all work together to provide you with the care you need, when you need it.  Your next appointment:   12 month(s)  The format for your next appointment:   Either In Person or Virtual  Provider:   You may see Kirk Ruths MD or one of the following Advanced Practice Providers on your designated Care Team:    Kerin Ransom, PA-C  [place], [place]  Coletta Memos, [place]

## 2020-03-21 ENCOUNTER — Other Ambulatory Visit (INDEPENDENT_AMBULATORY_CARE_PROVIDER_SITE_OTHER): Payer: Medicare Other

## 2020-03-21 ENCOUNTER — Other Ambulatory Visit: Payer: Self-pay

## 2020-03-21 DIAGNOSIS — I2510 Atherosclerotic heart disease of native coronary artery without angina pectoris: Secondary | ICD-10-CM

## 2020-03-21 DIAGNOSIS — E785 Hyperlipidemia, unspecified: Secondary | ICD-10-CM | POA: Diagnosis not present

## 2020-03-21 DIAGNOSIS — I480 Paroxysmal atrial fibrillation: Secondary | ICD-10-CM | POA: Diagnosis not present

## 2020-03-21 DIAGNOSIS — I2583 Coronary atherosclerosis due to lipid rich plaque: Secondary | ICD-10-CM

## 2020-03-21 DIAGNOSIS — N320 Bladder-neck obstruction: Secondary | ICD-10-CM

## 2020-03-21 LAB — URINALYSIS
Bilirubin Urine: NEGATIVE
Ketones, ur: NEGATIVE
Leukocytes,Ua: NEGATIVE
Nitrite: NEGATIVE
Specific Gravity, Urine: 1.025 (ref 1.000–1.030)
Total Protein, Urine: NEGATIVE
Urine Glucose: NEGATIVE
Urobilinogen, UA: 0.2 (ref 0.0–1.0)
pH: 6 (ref 5.0–8.0)

## 2020-03-21 LAB — BASIC METABOLIC PANEL
BUN: 19 mg/dL (ref 6–23)
CO2: 28 mEq/L (ref 19–32)
Calcium: 9.6 mg/dL (ref 8.4–10.5)
Chloride: 104 mEq/L (ref 96–112)
Creatinine, Ser: 0.91 mg/dL (ref 0.40–1.50)
GFR: 82.88 mL/min (ref >60.00)
Glucose, Bld: 100 mg/dL — ABNORMAL HIGH (ref 70–99)
Potassium: 4.2 mEq/L (ref 3.5–5.1)
Sodium: 140 mEq/L (ref 135–145)

## 2020-03-21 LAB — CBC WITH DIFFERENTIAL/PLATELET
Basophils Absolute: 0 10*3/uL (ref 0.0–0.1)
Basophils Relative: 0.5 % (ref 0.0–3.0)
Eosinophils Absolute: 0.3 10*3/uL (ref 0.0–0.7)
Eosinophils Relative: 4.5 % (ref 0.0–5.0)
HCT: 45.8 % (ref 39.0–52.0)
Hemoglobin: 15.4 g/dL (ref 13.0–17.0)
Lymphocytes Relative: 27.2 % (ref 12.0–46.0)
Lymphs Abs: 1.7 10*3/uL (ref 0.7–4.0)
MCHC: 33.6 g/dL (ref 30.0–36.0)
MCV: 90.2 fl (ref 78.0–100.0)
Monocytes Absolute: 0.5 10*3/uL (ref 0.1–1.0)
Monocytes Relative: 8.3 % (ref 3.0–12.0)
Neutro Abs: 3.7 10*3/uL (ref 1.4–7.7)
Neutrophils Relative %: 59.5 % (ref 43.0–77.0)
Platelets: 206 10*3/uL (ref 150.0–400.0)
RBC: 5.08 Mil/uL (ref 4.22–5.81)
RDW: 14 % (ref 11.5–15.5)
WBC: 6.3 10*3/uL (ref 4.0–10.5)

## 2020-03-21 LAB — LIPID PANEL
Cholesterol: 198 mg/dL (ref 0–200)
HDL: 45.2 mg/dL (ref >39.00)
LDL Cholesterol: 125 mg/dL — ABNORMAL HIGH (ref 0–99)
NonHDL: 152.42
Total CHOL/HDL Ratio: 4
Triglycerides: 136 mg/dL (ref 0.0–149.0)
VLDL: 27.2 mg/dL (ref 0.0–40.0)

## 2020-03-21 LAB — PSA: PSA: 0.73 ng/mL (ref 0.10–4.00)

## 2020-03-21 LAB — TSH: TSH: 1.14 u[IU]/mL (ref 0.35–4.50)

## 2020-03-21 LAB — HEPATIC FUNCTION PANEL
ALT: 14 U/L (ref 0–53)
AST: 14 U/L (ref 0–37)
Albumin: 4.3 g/dL (ref 3.5–5.2)
Alkaline Phosphatase: 74 U/L (ref 39–117)
Bilirubin, Direct: 0.1 mg/dL (ref 0.0–0.3)
Total Bilirubin: 0.7 mg/dL (ref 0.2–1.2)
Total Protein: 6.7 g/dL (ref 6.0–8.3)

## 2020-03-26 ENCOUNTER — Encounter: Payer: Self-pay | Admitting: Internal Medicine

## 2020-03-26 ENCOUNTER — Other Ambulatory Visit: Payer: Self-pay

## 2020-03-26 ENCOUNTER — Ambulatory Visit (INDEPENDENT_AMBULATORY_CARE_PROVIDER_SITE_OTHER): Payer: Medicare Other | Admitting: Internal Medicine

## 2020-03-26 VITALS — BP 156/100 | HR 91 | Temp 98.0°F | Ht 74.0 in | Wt 239.0 lb

## 2020-03-26 DIAGNOSIS — I471 Supraventricular tachycardia: Secondary | ICD-10-CM | POA: Diagnosis not present

## 2020-03-26 DIAGNOSIS — I2510 Atherosclerotic heart disease of native coronary artery without angina pectoris: Secondary | ICD-10-CM

## 2020-03-26 DIAGNOSIS — E785 Hyperlipidemia, unspecified: Secondary | ICD-10-CM | POA: Diagnosis not present

## 2020-03-26 DIAGNOSIS — I10 Essential (primary) hypertension: Secondary | ICD-10-CM

## 2020-03-26 DIAGNOSIS — R0989 Other specified symptoms and signs involving the circulatory and respiratory systems: Secondary | ICD-10-CM

## 2020-03-26 DIAGNOSIS — I2583 Coronary atherosclerosis due to lipid rich plaque: Secondary | ICD-10-CM | POA: Diagnosis not present

## 2020-03-26 NOTE — Assessment & Plan Note (Signed)
Metoprolol 

## 2020-03-26 NOTE — Assessment & Plan Note (Signed)
Seeing Dr Stanford Breed Multivessel coronary artery atherosclerosis.  Repatha info

## 2020-03-26 NOTE — Patient Instructions (Signed)
Evolocumab injection What is this medicine? EVOLOCUMAB (e voe LOK ue mab) is known as a PCSK9 inhibitor. It is used to lower the level of cholesterol in the blood. It may be used alone or in combination with other cholesterol-lowering drugs. This drug may also be used to reduce the risk of heart attack, stroke, and certain types of heart surgery in patients with heart disease. This medicine may be used for other purposes; ask your health care provider or pharmacist if you have questions. COMMON BRAND NAME(S): Repatha What should I tell my health care provider before I take this medicine? They need to know if you have any of these conditions:  an unusual or allergic reaction to evolocumab, other medicines, latex, foods, dyes, or preservatives  pregnant or trying to get pregnant  breast-feeding How should I use this medicine? This medicine is for injection under the skin. You will be taught how to prepare and give this medicine. Use exactly as directed. Take your medicine at regular intervals. Do not take your medicine more often than directed. It is important that you put your used needles and syringes in a special sharps container. Do not put them in a trash can. If you do not have a sharps container, call your pharmacist or health care provider to get one. Talk to your pediatrician regarding the use of this medicine in children. While this drug may be prescribed for children as young as 13 years for selected conditions, precautions do apply. Overdosage: If you think you have taken too much of this medicine contact a poison control center or emergency room at once. NOTE: This medicine is only for you. Do not share this medicine with others. What if I miss a dose? If you miss a dose, take it as soon as you can if there are more than 7 days until the next scheduled dose, or skip the missed dose and take the next dose according to your original schedule. Do not take double or extra doses. What may  interact with this medicine? Interactions are not expected. This list may not describe all possible interactions. Give your health care provider a list of all the medicines, herbs, non-prescription drugs, or dietary supplements you use. Also tell them if you smoke, drink alcohol, or use illegal drugs. Some items may interact with your medicine. What should I watch for while using this medicine? Visit your health care provider for regular checks on your progress. Tell your health care provider if your symptoms do not start to get better or if they get worse. You may need blood work done while you are taking this drug. Do not wear the on-body infuser during an MRI. What side effects may I notice from receiving this medicine? Side effects that you should report to your doctor or health care professional as soon as possible:  allergic reactions like skin rash, itching or hives, swelling of the face, lips, or tongue  signs and symptoms of high blood sugar such as dizziness; dry mouth; dry skin; fruity breath; nausea; stomach pain; increased hunger or thirst; increased urination  signs and symptoms of infection like fever or chills; cough; sore throat; pain or trouble passing urine Side effects that usually do not require medical attention (report to your doctor or health care professional if they continue or are bothersome):  diarrhea  nausea  muscle pain  pain, redness, or irritation at site where injected This list may not describe all possible side effects. Call your doctor for medical   advice about side effects. You may report side effects to FDA at 1-800-FDA-1088. Where should I keep my medicine? Keep out of the reach of children. You will be instructed on how to store this medicine. Throw away any unused medicine after the expiration date on the label. NOTE: This sheet is a summary. It may not cover all possible information. If you have questions about this medicine, talk to your doctor,  pharmacist, or health care provider.  2020 Elsevier/Gold Standard (2019-06-19 16:22:29)  

## 2020-03-26 NOTE — Progress Notes (Signed)
Subjective:  Patient ID: John Boone, male    DOB: [birth year]  Age: 69 y.o. MRN: [id number]  CC: No chief complaint on file.   HPI John Boone presents for   Outpatient Medications Prior to Visit  Medication Sig Dispense Refill  . albuterol (PROVENTIL HFA) 108 (90 Base) MCG/ACT inhaler Inhale 2 puffs into the lungs 4 (four) times daily as needed for shortness of breath. 1 Inhaler 11  . aspirin EC 81 MG tablet Take 1 tablet (81 mg total) by mouth daily. 90 tablet 3  . Cholecalciferol (VITAMIN D3) 50 MCG (2000 UT) capsule Take 1 capsule (2,000 Units total) by mouth daily. 100 capsule 3  . doxycycline (VIBRA-TABS) 100 MG tablet Take 1 tablet (100 mg total) by mouth 2 (two) times daily. 180 tablet 3  . meloxicam (MOBIC) 15 MG tablet Take 1 tablet (15 mg total) by mouth daily. (Patient taking differently: Take 15 mg by mouth as needed. ) 90 tablet 0  . metoprolol succinate (TOPROL-XL) 50 MG 24 hr tablet Take 1 tablet (50 mg total) by mouth 2 (two) times daily. 180 tablet 3   No facility-administered medications prior to visit.    ROS: Review of Systems  Constitutional: Positive for unexpected weight change. Negative for appetite change and fatigue.  HENT: Negative for congestion, nosebleeds, sneezing, sore throat and trouble swallowing.   Eyes: Negative for itching and visual disturbance.  Respiratory: Negative for cough.   Cardiovascular: Negative for chest pain, palpitations and leg swelling.  Gastrointestinal: Negative for abdominal distention, blood in stool, diarrhea and nausea.  Genitourinary: Negative for frequency and hematuria.  Musculoskeletal: Negative for back pain, gait problem, joint swelling and neck pain.  Skin: Negative for rash.  Neurological: Negative for dizziness, tremors, speech difficulty and weakness.  Psychiatric/Behavioral: Negative for agitation, dysphoric mood and sleep disturbance. The patient is not nervous/anxious.     Objective:  BP (!) 156/100 (BP  Location: Right Arm, Patient Position: Sitting, Cuff Size: Large)   Pulse 91   Temp 98 F (36.7 C) (Oral)   Ht 6\' 2"  (1.88 m)   Wt (!) 239 lb (108.4 kg)   SpO2 97%   BMI 30.69 kg/m   BP Readings from Last 3 Encounters:  03/26/20 (!) 156/100  10/22/19 (!) 146/110  09/27/19 (!) 178/96    Wt Readings from Last 3 Encounters:  03/26/20 (!) 239 lb (108.4 kg)  10/22/19 239 lb (108.4 kg)  09/27/19 240 lb (108.9 kg)    Physical Exam Constitutional:      General: He is not in acute distress.    Appearance: He is well-developed.     Comments: NAD  Eyes:     Conjunctiva/sclera: Conjunctivae normal.     Pupils: Pupils are equal, round, and reactive to light.  Neck:     Thyroid: No thyromegaly.     Vascular: No JVD.  Cardiovascular:     Rate and Rhythm: Normal rate and regular rhythm.     Heart sounds: Normal heart sounds. No murmur heard.  No friction rub. No gallop.   Pulmonary:     Effort: Pulmonary effort is normal. No respiratory distress.     Breath sounds: Normal breath sounds. No wheezing or rales.  Chest:     Chest wall: No tenderness.  Abdominal:     General: Bowel sounds are normal. There is no distension.     Palpations: Abdomen is soft. There is no mass.     Tenderness: There is no abdominal  tenderness. There is no guarding or rebound.  Musculoskeletal:        General: No tenderness. Normal range of motion.     Cervical back: Normal range of motion.  Lymphadenopathy:     Cervical: No cervical adenopathy.  Skin:    General: Skin is warm and dry.     Findings: No rash.  Neurological:     Mental Status: He is alert and oriented to person, place, and time.     Cranial Nerves: No cranial nerve deficit.     Motor: No abnormal muscle tone.     Coordination: Coordination normal.     Gait: Gait normal.     Deep Tendon Reflexes: Reflexes are normal and symmetric.  Psychiatric:        Behavior: Behavior normal.        Thought Content: Thought content normal.         Judgment: Judgment normal.   mild bruit  Lab Results  Component Value Date   WBC 6.3 03/21/2020   HGB 15.4 03/21/2020   HCT 45.8 03/21/2020   PLT 206.0 03/21/2020   GLUCOSE 100 (H) 03/21/2020   CHOL 198 03/21/2020   TRIG 136.0 03/21/2020   HDL 45.20 03/21/2020   LDLCALC 125 (H) 03/21/2020   ALT 14 03/21/2020   AST 14 03/21/2020   NA 140 03/21/2020   K 4.2 03/21/2020   CL 104 03/21/2020   CREATININE 0.91 03/21/2020   BUN 19 03/21/2020   CO2 28 03/21/2020   TSH 1.14 03/21/2020   PSA 0.73 03/21/2020    CT RENAL STONE STUDY  Result Date: 05/11/2018 CLINICAL DATA:  Left flank pain EXAM: CT ABDOMEN AND PELVIS WITHOUT CONTRAST TECHNIQUE: Multidetector CT imaging of the abdomen and pelvis was performed following the standard protocol without IV contrast. COMPARISON:  05/17/2017 FINDINGS: Lower chest: Calcified pleural plaque is noted at the lung bases, stable. Lingular and right middle lobe scarring. No effusions. Heart is normal size. Hepatobiliary: No focal hepatic abnormality. Gallbladder unremarkable. Pancreas: No focal abnormality or ductal dilatation. Spleen: No focal abnormality.  Normal size. Adrenals/Urinary Tract: No renal or ureteral stones. No hydronephrosis. Mild bilateral perinephric stranding is stable since prior study. There is a left UVJ stone measuring approximately 4 mm, nonobstructing. Adrenal glands and urinary bladder unremarkable. Stomach/Bowel: Stomach, large and small bowel grossly unremarkable. Vascular/Lymphatic: Aortic atherosclerosis. No enlarged abdominal or pelvic lymph nodes. Reproductive: Mild central prostate calcifications and prominence of the prostate. Other: No free fluid or free air. Musculoskeletal: No acute bony abnormality. IMPRESSION: 4 mm left UVJ stone, nonobstructing.  No hydronephrosis. Aortic atherosclerosis. Mildly prominent prostate. Bilateral calcified pleural plaques, stable. Electronically Signed   By: Rolm Baptise M.D.   On: 05/11/2018 08:15     Assessment & Plan:   There are no diagnoses linked to this encounter.   No orders of the defined types were placed in this encounter.    Follow-up: No follow-ups on file.  Walker Kehr, MD

## 2020-03-26 NOTE — Assessment & Plan Note (Signed)
Statin intolerant 

## 2020-03-26 NOTE — Assessment & Plan Note (Signed)
Nl BP at home  Toprol XL

## 2020-04-07 NOTE — Addendum Note (Signed)
Addended by: Aviva Signs M on: 04/07/2020 11:54 AM   Modules accepted: Orders

## 2020-04-17 NOTE — Addendum Note (Signed)
Addended by: Karle Barr on: 04/17/2020 09:25 AM   Modules accepted: Orders

## 2020-04-23 ENCOUNTER — Other Ambulatory Visit: Payer: Self-pay

## 2020-04-23 ENCOUNTER — Ambulatory Visit (HOSPITAL_COMMUNITY)
Admission: RE | Admit: 2020-04-23 | Discharge: 2020-04-23 | Disposition: A | Discharge status: Home / Self Care | Payer: Medicare Other | Source: Ambulatory Visit | Attending: Internal Medicine | Admitting: Internal Medicine

## 2020-04-23 DIAGNOSIS — R0989 Other specified symptoms and signs involving the circulatory and respiratory systems: Secondary | ICD-10-CM | POA: Insufficient documentation

## 2020-05-15 IMAGING — CT CT HEAD W/O CM
3 of 4 series · 14 of 47 positions shown, 16 images · non-contrast
Comparison: November 07, 24

CLINICAL DATA: Trauma to the head yesterday.

EXAM:
CT HEAD WITHOUT CONTRAST
TECHNIQUE: Contiguous axial images were obtained from the base of the skull
through the vertex without intravenous contrast.

[Series 4: head 2.0 h70h · axial · 0.52mm/px · z∈[-84,+60]mm · 8 of 90 slices shown, 10 images]
[im 9/90  brain]
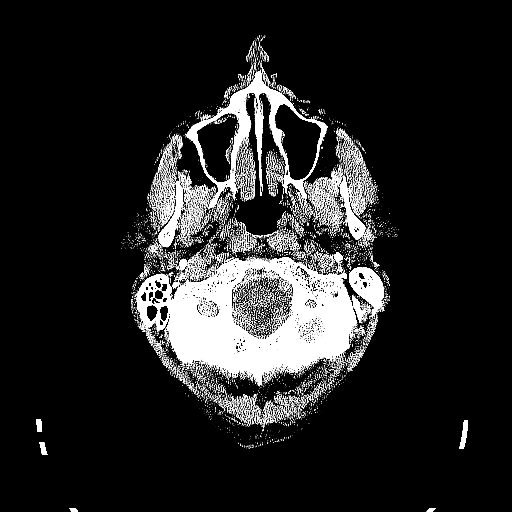
[im 9/90  bone]
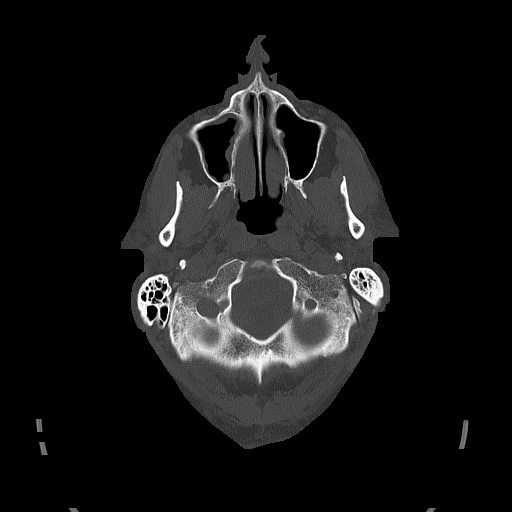
[im 18/90  brain]
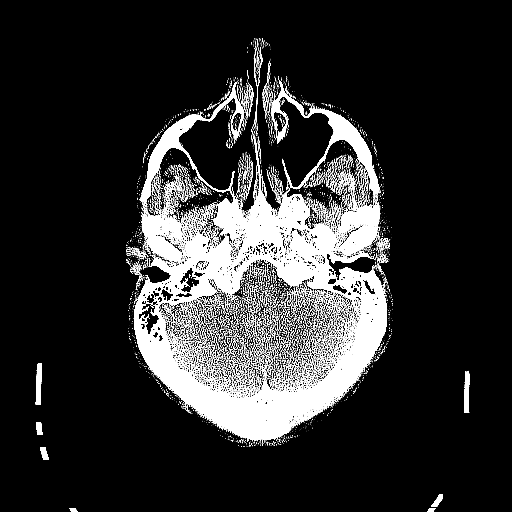
[im 27/90  brain]
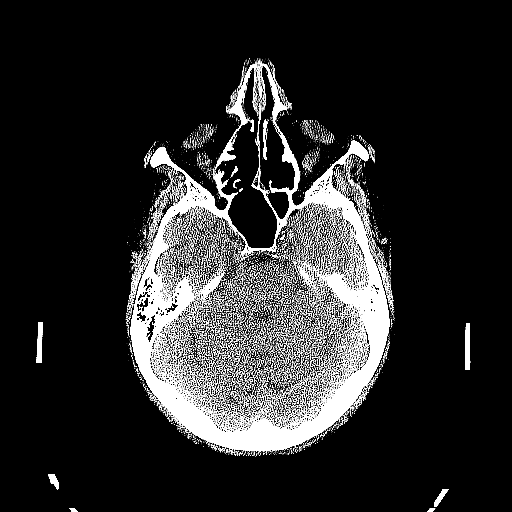
[im 41/90  brain]
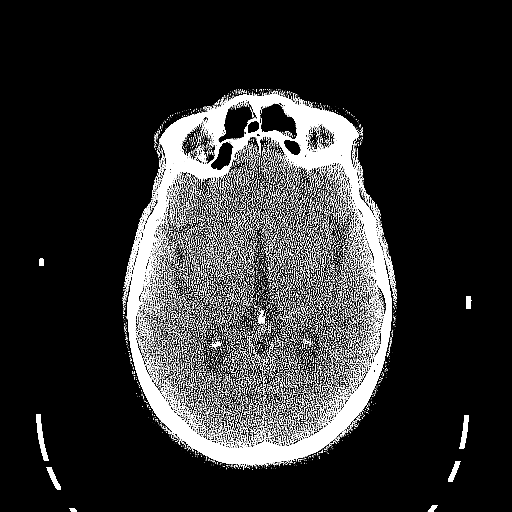
[im 49/90  brain]
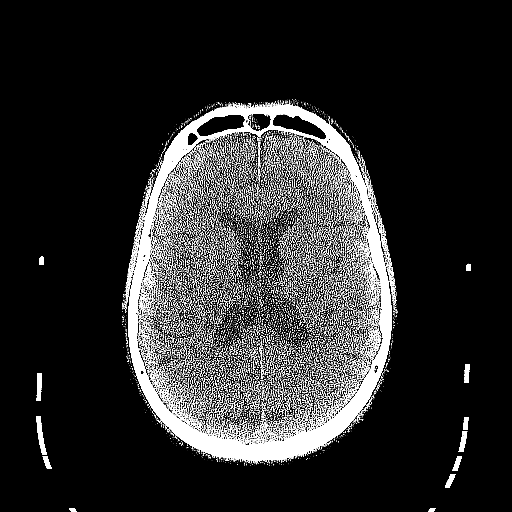
[im 49/90  bone]
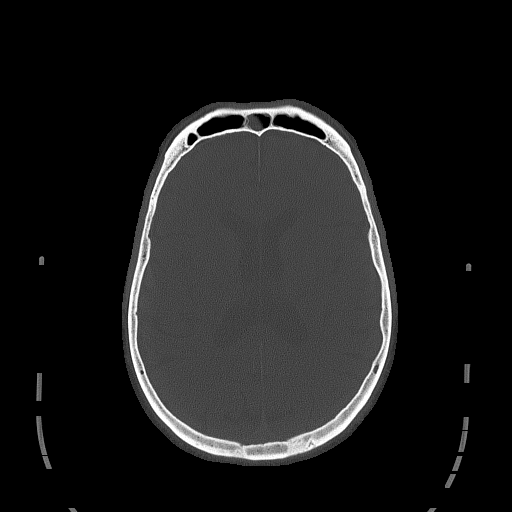
[im 63/90  brain]
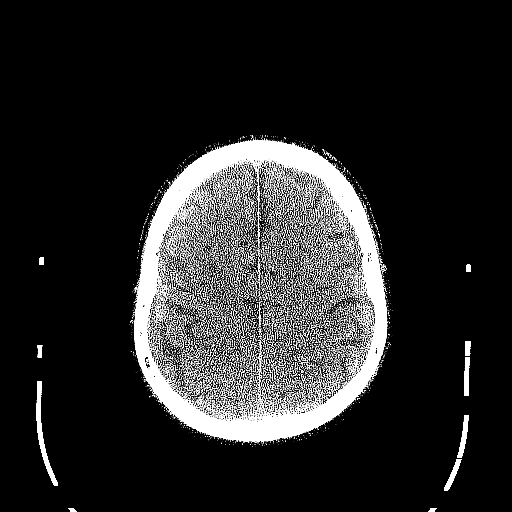
[im 72/90  brain]
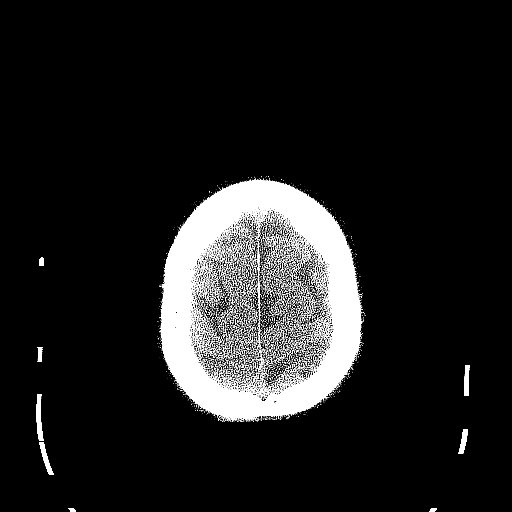
[im 81/90  brain]
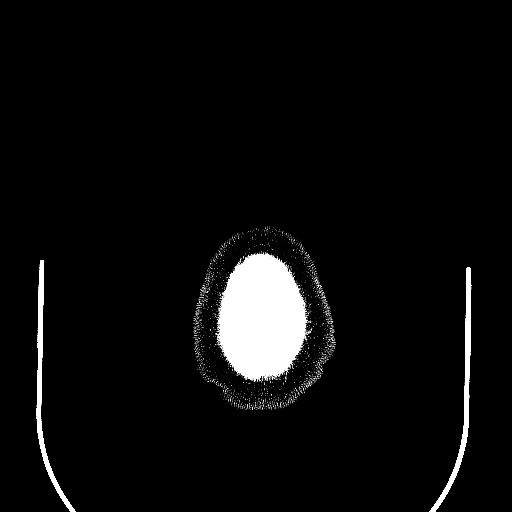

[Series 5: head 3.0 mpr cor · coronal · 0.35mm/px · 3 of 79 slices shown]
[im 27/79  brain]
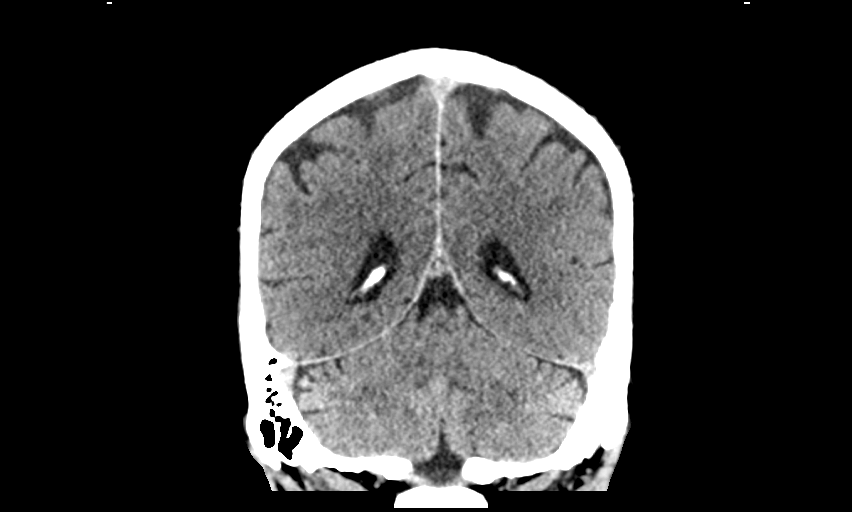
[im 35/79  brain]
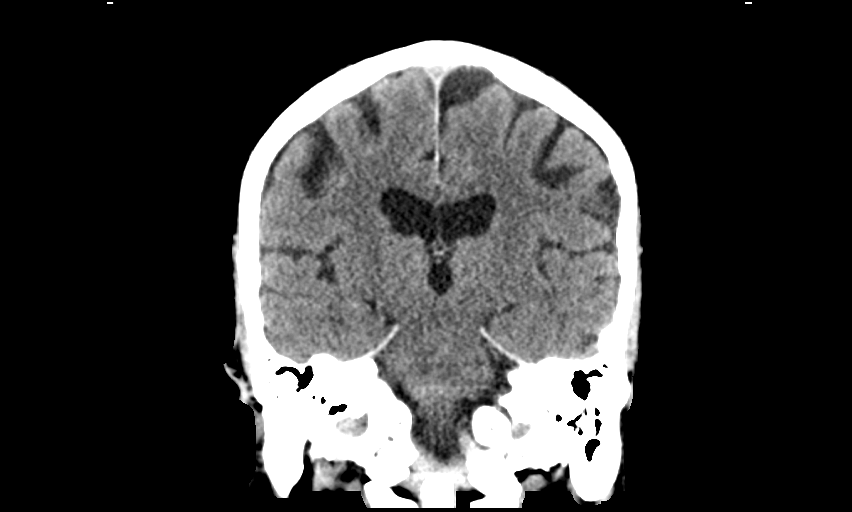
[im 44/79  brain]
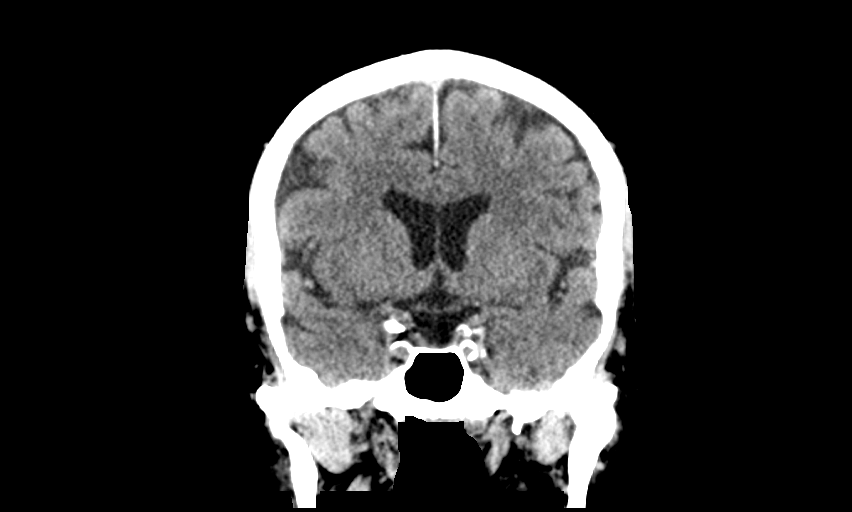

[Series 6: head 3.0 mpr sag · sagittal · 0.35mm/px · 3 of 67 slices shown]
[im 23/67  brain]
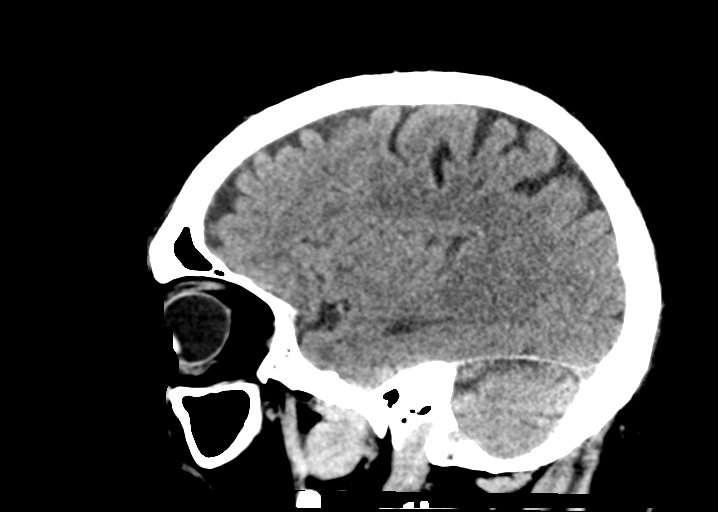
[im 34/67  brain]
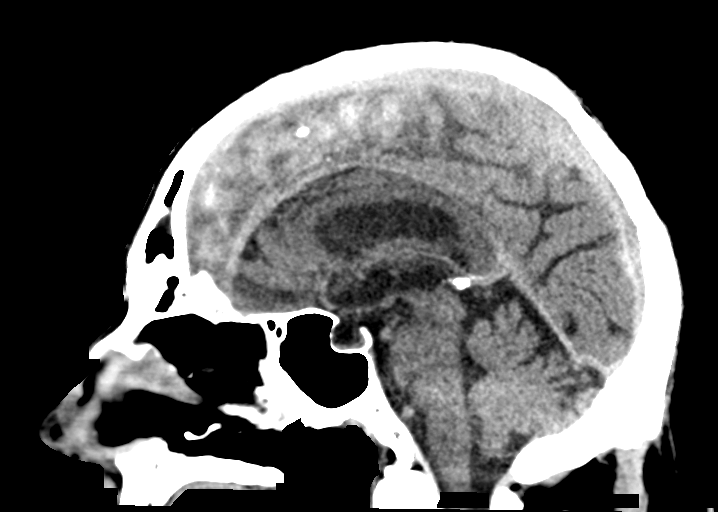
[im 45/67  brain]
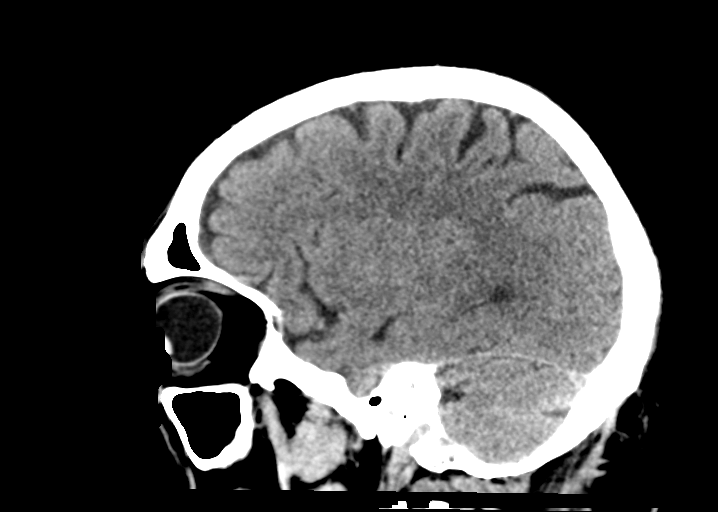

[14 of 47 positions shown; findings below may reference images not displayed]

FINDINGS: Brain: No evidence of acute infarction, hemorrhage, hydrocephalus,
extra-axial collection or mass lesion/mass effect. Mild chronic
diffuse atrophy. Bilateral periventricular white matter small vessel
ischemic changes identified.

Vascular: No hyperdense vessel or unexpected calcification.

Skull: Normal. Negative for fracture or focal lesion.

Sinuses/Orbits: Mucoperiosteal thickening of bilateral ethmoid,
frontal and maxillary sinuses are identified. There is evidence of
prior surgery of the medial wall of bilateral maxillary sinuses and
ethmoid sinuses.

Other: None.
IMPRESSION: No focal acute intracranial abnormality identified. Chronic diffuse
atrophy. Chronic bilateral periventricular white matter small vessel
ischemic change.

## 2020-09-22 ENCOUNTER — Other Ambulatory Visit: Payer: Self-pay | Admitting: Internal Medicine

## 2020-09-29 ENCOUNTER — Ambulatory Visit: Payer: Medicare Other | Admitting: Internal Medicine

## 2020-11-17 ENCOUNTER — Other Ambulatory Visit: Payer: Self-pay

## 2020-11-18 ENCOUNTER — Encounter: Payer: Self-pay | Admitting: Internal Medicine

## 2020-11-18 ENCOUNTER — Ambulatory Visit (INDEPENDENT_AMBULATORY_CARE_PROVIDER_SITE_OTHER): Payer: Medicare Other | Admitting: Internal Medicine

## 2020-11-18 VITALS — BP 130/86 | HR 94 | Temp 98.1°F | Ht 74.0 in | Wt 229.0 lb

## 2020-11-18 DIAGNOSIS — N320 Bladder-neck obstruction: Secondary | ICD-10-CM | POA: Diagnosis not present

## 2020-11-18 DIAGNOSIS — I480 Paroxysmal atrial fibrillation: Secondary | ICD-10-CM

## 2020-11-18 DIAGNOSIS — E785 Hyperlipidemia, unspecified: Secondary | ICD-10-CM | POA: Diagnosis not present

## 2020-11-18 DIAGNOSIS — I2583 Coronary atherosclerosis due to lipid rich plaque: Secondary | ICD-10-CM

## 2020-11-18 DIAGNOSIS — I2510 Atherosclerotic heart disease of native coronary artery without angina pectoris: Secondary | ICD-10-CM

## 2020-11-18 MED ORDER — METOPROLOL SUCCINATE ER 50 MG PO TB24: 50.0000 mg | ORAL_TABLET | Freq: Two times a day (BID) | ORAL | 3 refills | Status: DC

## 2020-11-18 MED ORDER — MEGARED OMEGA-3 KRILL OIL 500 MG PO CAPS: 1.0000 | ORAL_CAPSULE | Freq: Every morning | ORAL | 3 refills | Status: DC

## 2020-11-18 NOTE — Progress Notes (Signed)
Subjective:  Patient ID: John Boone, male    DOB: 1951-07-11  Age: 70 y.o. MRN: [id number]  CC: Follow-up (6 month f/u)   HPI John Boone presents for HTN, rosacea, OA  Pt was advised to get a COVID vaccine   Outpatient Medications Prior to Visit  Medication Sig Dispense Refill  . albuterol (PROVENTIL HFA) 108 (90 Base) MCG/ACT inhaler Inhale 2 puffs into the lungs 4 (four) times daily as needed for shortness of breath. 1 Inhaler 11  . aspirin EC 81 MG tablet Take 1 tablet (81 mg total) by mouth daily. 90 tablet 3  . Cholecalciferol (VITAMIN D3) 50 MCG (2000 UT) capsule Take 1 capsule (2,000 Units total) by mouth daily. 100 capsule 3  . doxycycline (VIBRA-TABS) 100 MG tablet TAKE 1 TABLET BY MOUTH TWICE A DAY 180 tablet 3  . meloxicam (MOBIC) 15 MG tablet Take 1 tablet (15 mg total) by mouth daily. (Patient taking differently: Take 15 mg by mouth as needed.) 90 tablet 0  . metoprolol succinate (TOPROL-XL) 50 MG 24 hr tablet Take 1 tablet (50 mg total) by mouth 2 (two) times daily. 180 tablet 3   No facility-administered medications prior to visit.    ROS: Review of Systems  Constitutional: Negative for appetite change, fatigue and unexpected weight change.  HENT: Negative for congestion, nosebleeds, sneezing, sore throat and trouble swallowing.   Eyes: Negative for itching and visual disturbance.  Respiratory: Negative for cough.   Cardiovascular: Negative for chest pain, palpitations and leg swelling.  Gastrointestinal: Negative for abdominal distention, blood in stool, diarrhea and nausea.  Genitourinary: Negative for frequency and hematuria.  Musculoskeletal: Negative for back pain, gait problem, joint swelling and neck pain.  Skin: Positive for color change and rash.  Neurological: Negative for dizziness, tremors, speech difficulty and weakness.  Psychiatric/Behavioral: Negative for agitation, dysphoric mood and sleep disturbance. The patient is not nervous/anxious.      Objective:  BP 130/86 (BP Location: Left Arm)   Pulse 94   Temp 98.1 F (36.7 C) (Oral)   Ht 6\' 2"  (1.88 m)   Wt 229 lb (103.9 kg)   SpO2 97%   BMI 29.40 kg/m   BP Readings from Last 3 Encounters:  11/18/20 130/86  03/26/20 (!) 156/100  10/22/19 (!) 146/110    Wt Readings from Last 3 Encounters:  11/18/20 229 lb (103.9 kg)  03/26/20 (!) 239 lb (108.4 kg)  10/22/19 239 lb (108.4 kg)    Physical Exam Constitutional:      General: He is not in acute distress.    Appearance: He is well-developed.     Comments: NAD  Eyes:     Conjunctiva/sclera: Conjunctivae normal.     Pupils: Pupils are equal, round, and reactive to light.  Neck:     Thyroid: No thyromegaly.     Vascular: No JVD.  Cardiovascular:     Rate and Rhythm: Normal rate and regular rhythm.     Heart sounds: Normal heart sounds. No murmur heard. No friction rub. No gallop.   Pulmonary:     Effort: Pulmonary effort is normal. No respiratory distress.     Breath sounds: Normal breath sounds. No wheezing or rales.  Chest:     Chest wall: No tenderness.  Abdominal:     General: Bowel sounds are normal. There is no distension.     Palpations: Abdomen is soft. There is no mass.     Tenderness: There is no abdominal tenderness. There is  no guarding or rebound.  Musculoskeletal:        General: No tenderness. Normal range of motion.     Cervical back: Normal range of motion.  Lymphadenopathy:     Cervical: No cervical adenopathy.  Skin:    General: Skin is warm and dry.     Findings: No rash.  Neurological:     Mental Status: He is alert and oriented to person, place, and time.     Cranial Nerves: No cranial nerve deficit.     Motor: No abnormal muscle tone.     Coordination: Coordination normal.     Gait: Gait normal.     Deep Tendon Reflexes: Reflexes are normal and symmetric.  Psychiatric:        Behavior: Behavior normal.        Thought Content: Thought content normal.        Judgment: Judgment  normal.   rosacea  Lab Results  Component Value Date   WBC 6.3 03/21/2020   HGB 15.4 03/21/2020   HCT 45.8 03/21/2020   PLT 206.0 03/21/2020   GLUCOSE 100 (H) 03/21/2020   CHOL 198 03/21/2020   TRIG 136.0 03/21/2020   HDL 45.20 03/21/2020   LDLCALC 125 (H) 03/21/2020   ALT 14 03/21/2020   AST 14 03/21/2020   NA 140 03/21/2020   K 4.2 03/21/2020   CL 104 03/21/2020   CREATININE 0.91 03/21/2020   BUN 19 03/21/2020   CO2 28 03/21/2020   TSH 1.14 03/21/2020   PSA 0.73 03/21/2020    VAS US CAROTID  Result Date: 04/23/2020 Carotid Arterial Duplex Study Indications:       Bilateral bruits and patient c/o double vision but relates it                    to having cataracts. He denies any other cerebrovascular                    symptoms. Risk Factors:      Hypertension, hyperlipidemia, past history of smoking,                    coronary artery disease. Comparison Study:  NA Performing Technologist: Sharlett Iles RVT  Examination Guidelines: A complete evaluation includes B-mode imaging, spectral Doppler, color Doppler, and power Doppler as needed of all accessible portions of each vessel. Bilateral testing is considered an integral part of a complete examination. Limited examinations for reoccurring indications may be performed as noted.  Right Carotid Findings: +----------+--------+--------+--------+------------------+--------+           PSV cm/sEDV cm/sStenosisPlaque DescriptionComments +----------+--------+--------+--------+------------------+--------+ CCA Prox  91      17                                         +----------+--------+--------+--------+------------------+--------+ CCA Distal79      23                                         +----------+--------+--------+--------+------------------+--------+ ICA Prox  58      16                                         +----------+--------+--------+--------+------------------+--------+  ICA Mid   62      27       Normal                             +----------+--------+--------+--------+------------------+--------+ ICA Distal71      28                                         +----------+--------+--------+--------+------------------+--------+ ECA       96      24                                         +----------+--------+--------+--------+------------------+--------+ +----------+--------+-------+----------------+-------------------+           PSV cm/sEDV cmsDescribe        Arm Pressure (mmHG) +----------+--------+-------+----------------+-------------------+ Subclavian180            Mildly turbulent151                 +----------+--------+-------+----------------+-------------------+ +---------+--------+--+--------+-+ VertebralPSV cm/s25EDV cm/s6 +---------+--------+--+--------+-+  Left Carotid Findings: +----------+--------+--------+--------+------------------+--------+           PSV cm/sEDV cm/sStenosisPlaque DescriptionComments +----------+--------+--------+--------+------------------+--------+ CCA Prox  100     28                                         +----------+--------+--------+--------+------------------+--------+ CCA Distal73      19                                         +----------+--------+--------+--------+------------------+--------+ ICA Prox  58      21                                         +----------+--------+--------+--------+------------------+--------+ ICA Mid   71      29      Normal                             +----------+--------+--------+--------+------------------+--------+ ICA Distal81      34                                         +----------+--------+--------+--------+------------------+--------+ ECA       99      20                                         +----------+--------+--------+--------+------------------+--------+ +----------+--------+--------+----------------+-------------------+           PSV  cm/sEDV cm/sDescribe        Arm Pressure (mmHG) +----------+--------+--------+----------------+-------------------+ Subclavian101             Multiphasic, BHA193                 +----------+--------+--------+----------------+-------------------+ +---------+--------+--+--------+--+---------+ VertebralPSV cm/s63EDV cm/s21Antegrade +---------+--------+--+--------+--+---------+   Summary: Right Carotid:  There was no evidence of thrombus, dissection, atherosclerotic                plaque or stenosis in the cervical carotid system. Left Carotid: There was no evidence of thrombus, dissection, atherosclerotic               plaque or stenosis in the cervical carotid system. Vertebrals:  Bilateral vertebral arteries demonstrate antegrade flow. Subclavians: Right subclavian artery flow was mildly disturbed. Normal flow              hemodynamics were seen in the left subclavian artery. *See table(s) above for measurements and observations.  Electronically signed by Ena Dawley MD on 04/23/2020 at 4:08:37 PM.    Final     Assessment & Plan:   There are no diagnoses linked to this encounter.   Meds ordered this encounter  Medications  . metoprolol succinate (TOPROL-XL) 50 MG 24 hr tablet    Sig: Take 1 tablet (50 mg total) by mouth 2 (two) times daily.    Dispense:  180 tablet    Refill:  3     Follow-up: No follow-ups on file.   Walker Kehr, MD

## 2020-11-18 NOTE — Assessment & Plan Note (Addendum)
Pt declined statins,Repatha Krill oil

## 2020-11-18 NOTE — Assessment & Plan Note (Signed)
Cont w/Toprol XL, ASA

## 2021-05-06 ENCOUNTER — Encounter: Payer: Self-pay | Admitting: Gastroenterology

## 2021-05-21 ENCOUNTER — Ambulatory Visit: Payer: Medicare Other | Admitting: Internal Medicine

## 2021-06-02 ENCOUNTER — Other Ambulatory Visit: Payer: Self-pay

## 2021-06-02 ENCOUNTER — Encounter: Payer: Self-pay | Admitting: Internal Medicine

## 2021-06-02 ENCOUNTER — Ambulatory Visit (INDEPENDENT_AMBULATORY_CARE_PROVIDER_SITE_OTHER): Payer: Medicare Other | Admitting: Internal Medicine

## 2021-06-02 DIAGNOSIS — E785 Hyperlipidemia, unspecified: Secondary | ICD-10-CM | POA: Diagnosis not present

## 2021-06-02 DIAGNOSIS — I471 Supraventricular tachycardia: Secondary | ICD-10-CM | POA: Diagnosis not present

## 2021-06-02 DIAGNOSIS — N320 Bladder-neck obstruction: Secondary | ICD-10-CM

## 2021-06-02 DIAGNOSIS — I480 Paroxysmal atrial fibrillation: Secondary | ICD-10-CM | POA: Diagnosis not present

## 2021-06-02 DIAGNOSIS — I2510 Atherosclerotic heart disease of native coronary artery without angina pectoris: Secondary | ICD-10-CM

## 2021-06-02 DIAGNOSIS — J320 Chronic maxillary sinusitis: Secondary | ICD-10-CM

## 2021-06-02 DIAGNOSIS — I2583 Coronary atherosclerosis due to lipid rich plaque: Secondary | ICD-10-CM

## 2021-06-02 DIAGNOSIS — L719 Rosacea, unspecified: Secondary | ICD-10-CM

## 2021-06-02 LAB — CBC WITH DIFFERENTIAL/PLATELET
Basophils Absolute: 0 10*3/uL (ref 0.0–0.1)
Basophils Relative: 0.4 % (ref 0.0–3.0)
Eosinophils Absolute: 0.3 10*3/uL (ref 0.0–0.7)
Eosinophils Relative: 4.3 % (ref 0.0–5.0)
HCT: 47 % (ref 39.0–52.0)
Hemoglobin: 15.5 g/dL (ref 13.0–17.0)
Lymphocytes Relative: 26.8 % (ref 12.0–46.0)
Lymphs Abs: 1.7 10*3/uL (ref 0.7–4.0)
MCHC: 32.9 g/dL (ref 30.0–36.0)
MCV: 91.1 fl (ref 78.0–100.0)
Monocytes Absolute: 0.5 10*3/uL (ref 0.1–1.0)
Monocytes Relative: 7.8 % (ref 3.0–12.0)
Neutro Abs: 3.9 10*3/uL (ref 1.4–7.7)
Neutrophils Relative %: 60.7 % (ref 43.0–77.0)
Platelets: 197 10*3/uL (ref 150.0–400.0)
RBC: 5.16 Mil/uL (ref 4.22–5.81)
RDW: 14 % (ref 11.5–15.5)
WBC: 6.4 10*3/uL (ref 4.0–10.5)

## 2021-06-02 LAB — COMPREHENSIVE METABOLIC PANEL
ALT: 17 U/L (ref 0–53)
AST: 16 U/L (ref 0–37)
Albumin: 4.3 g/dL (ref 3.5–5.2)
Alkaline Phosphatase: 61 U/L (ref 39–117)
BUN: 18 mg/dL (ref 6–23)
CO2: 30 mEq/L (ref 19–32)
Calcium: 9.5 mg/dL (ref 8.4–10.5)
Chloride: 106 mEq/L (ref 96–112)
Creatinine, Ser: 0.93 mg/dL (ref 0.40–1.50)
GFR: 84.04 mL/min (ref >60.00)
Glucose, Bld: 89 mg/dL (ref 70–99)
Potassium: 4.7 mEq/L (ref 3.5–5.1)
Sodium: 142 mEq/L (ref 135–145)
Total Bilirubin: 0.5 mg/dL (ref 0.2–1.2)
Total Protein: 6.6 g/dL (ref 6.0–8.3)

## 2021-06-02 LAB — LIPID PANEL
Cholesterol: 218 mg/dL — ABNORMAL HIGH (ref 0–200)
HDL: 50.2 mg/dL (ref >39.00)
LDL Cholesterol: 137 mg/dL — ABNORMAL HIGH (ref 0–99)
NonHDL: 167.82
Total CHOL/HDL Ratio: 4
Triglycerides: 156 mg/dL — ABNORMAL HIGH (ref 0.0–149.0)
VLDL: 31.2 mg/dL (ref 0.0–40.0)

## 2021-06-02 LAB — PSA: PSA: 0.94 ng/mL (ref 0.10–4.00)

## 2021-06-02 LAB — URINALYSIS
Bilirubin Urine: NEGATIVE
Hgb urine dipstick: NEGATIVE
Ketones, ur: NEGATIVE
Leukocytes,Ua: NEGATIVE
Nitrite: NEGATIVE
Specific Gravity, Urine: 1.02 (ref 1.000–1.030)
Total Protein, Urine: NEGATIVE
Urine Glucose: NEGATIVE
Urobilinogen, UA: 0.2 (ref 0.0–1.0)
pH: 6.5 (ref 5.0–8.0)

## 2021-06-02 LAB — TSH: TSH: 1.15 u[IU]/mL (ref 0.35–5.50)

## 2021-06-02 NOTE — Assessment & Plan Note (Signed)
Try Afrin prn

## 2021-06-02 NOTE — Assessment & Plan Note (Signed)
Toprol XL, ASA

## 2021-06-02 NOTE — Assessment & Plan Note (Signed)
Continue on Doxycycline

## 2021-06-02 NOTE — Assessment & Plan Note (Signed)
No SVT lately On Metoprolol

## 2021-06-02 NOTE — Assessment & Plan Note (Signed)
On Kill oil

## 2021-06-02 NOTE — Progress Notes (Signed)
Subjective:  Patient ID: DEQUARIUS JEFFRIES, male    DOB: 1951-08-06  Age: 70 y.o. MRN: [id number]  CC: Follow-up (6 months f/u)   HPI NAIN RUDD presents for A fib, HTN, rosacea  Outpatient Medications Prior to Visit  Medication Sig Dispense Refill   albuterol (PROVENTIL HFA) 108 (90 Base) MCG/ACT inhaler Inhale 2 puffs into the lungs 4 (four) times daily as needed for shortness of breath. 1 Inhaler 11   aspirin EC 81 MG tablet Take 1 tablet (81 mg total) by mouth daily. 90 tablet 3   Cholecalciferol (VITAMIN D3) 50 MCG (2000 UT) capsule Take 1 capsule (2,000 Units total) by mouth daily. 100 capsule 3   doxycycline (VIBRA-TABS) 100 MG tablet TAKE 1 TABLET BY MOUTH TWICE A DAY 180 tablet 3   metoprolol succinate (TOPROL-XL) 50 MG 24 hr tablet Take 1 tablet (50 mg total) by mouth 2 (two) times daily. 180 tablet 3   Omega-3 Fatty Acids (FISH OIL TRIPLE STRENGTH) 1400 MG CAPS Take 1 capsule by mouth daily.     MegaRed Omega-3 Krill Oil 500 MG CAPS Take 1 capsule by mouth every morning. (Patient not taking: Reported on 06/02/2021) 100 capsule 3   meloxicam (MOBIC) 15 MG tablet Take 1 tablet (15 mg total) by mouth daily. (Patient not taking: Reported on 06/02/2021) 90 tablet 0   No facility-administered medications prior to visit.    ROS: Review of Systems  Constitutional:  Positive for unexpected weight change. Negative for appetite change and fatigue.  HENT:  Negative for congestion, nosebleeds, sneezing, sore throat and trouble swallowing.   Eyes:  Negative for itching and visual disturbance.  Respiratory:  Negative for cough.   Cardiovascular:  Positive for palpitations. Negative for chest pain and leg swelling.  Gastrointestinal:  Negative for abdominal distention, blood in stool, diarrhea and nausea.  Genitourinary:  Negative for frequency and hematuria.  Musculoskeletal:  Negative for back pain, gait problem, joint swelling and neck pain.  Skin:  Positive for rash.  Neurological:   Negative for dizziness, tremors, speech difficulty and weakness.  Psychiatric/Behavioral:  Negative for agitation, dysphoric mood, sleep disturbance and suicidal ideas. The patient is not nervous/anxious.    Objective:  BP (!) 141/98 (BP Location: Left Arm)   Pulse 75   Temp 98.1 F (36.7 C) (Oral)   Ht 6\' 2"  (1.88 m)   Wt 242 lb 9.6 oz (110 kg)   SpO2 96%   BMI 31.15 kg/m   BP Readings from Last 3 Encounters:  06/02/21 (!) 141/98  11/18/20 130/86  03/26/20 (!) 156/100    Wt Readings from Last 3 Encounters:  06/02/21 242 lb 9.6 oz (110 kg)  11/18/20 229 lb (103.9 kg)  03/26/20 (!) 239 lb (108.4 kg)    Physical Exam Constitutional:      General: He is not in acute distress.    Appearance: He is well-developed. He is obese.     Comments: NAD  Eyes:     Conjunctiva/sclera: Conjunctivae normal.     Pupils: Pupils are equal, round, and reactive to light.  Neck:     Thyroid: No thyromegaly.     Vascular: No JVD.  Cardiovascular:     Rate and Rhythm: Normal rate and regular rhythm.     Heart sounds: Normal heart sounds. No murmur heard.   No friction rub. No gallop.  Pulmonary:     Effort: Pulmonary effort is normal. No respiratory distress.     Breath sounds: Normal breath  sounds. No wheezing or rales.  Chest:     Chest wall: No tenderness.  Abdominal:     General: Bowel sounds are normal. There is no distension.     Palpations: Abdomen is soft. There is no mass.     Tenderness: There is no abdominal tenderness. There is no guarding or rebound.  Musculoskeletal:        General: No tenderness. Normal range of motion.     Cervical back: Normal range of motion.  Lymphadenopathy:     Cervical: No cervical adenopathy.  Skin:    General: Skin is warm and dry.     Findings: No rash.  Neurological:     Mental Status: He is alert and oriented to person, place, and time.     Cranial Nerves: No cranial nerve deficit.     Motor: No abnormal muscle tone.     Coordination:  Coordination normal.     Gait: Gait normal.     Deep Tendon Reflexes: Reflexes are normal and symmetric.  Psychiatric:        Behavior: Behavior normal.        Thought Content: Thought content normal.        Judgment: Judgment normal.  Rosacea rash on face  Lab Results  Component Value Date   WBC 6.3 03/21/2020   HGB 15.4 03/21/2020   HCT 45.8 03/21/2020   PLT 206.0 03/21/2020   GLUCOSE 100 (H) 03/21/2020   CHOL 198 03/21/2020   TRIG 136.0 03/21/2020   HDL 45.20 03/21/2020   LDLCALC 125 (H) 03/21/2020   ALT 14 03/21/2020   AST 14 03/21/2020   NA 140 03/21/2020   K 4.2 03/21/2020   CL 104 03/21/2020   CREATININE 0.91 03/21/2020   BUN 19 03/21/2020   CO2 28 03/21/2020   TSH 1.14 03/21/2020   PSA 0.73 03/21/2020    VAS US CAROTID  Result Date: 04/23/2020 Carotid Arterial Duplex Study Indications:       Bilateral bruits and patient c/o double vision but relates it                    to having cataracts. He denies any other cerebrovascular                    symptoms. Risk Factors:      Hypertension, hyperlipidemia, past history of smoking,                    coronary artery disease. Comparison Study:  NA Performing Technologist: Sharlett Iles RVT  Examination Guidelines: A complete evaluation includes B-mode imaging, spectral Doppler, color Doppler, and power Doppler as needed of all accessible portions of each vessel. Bilateral testing is considered an integral part of a complete examination. Limited examinations for reoccurring indications may be performed as noted.  Right Carotid Findings: +----------+--------+--------+--------+------------------+--------+           PSV cm/sEDV cm/sStenosisPlaque DescriptionComments +----------+--------+--------+--------+------------------+--------+ CCA Prox  91      17                                         +----------+--------+--------+--------+------------------+--------+ CCA Distal79      23                                          +----------+--------+--------+--------+------------------+--------+  ICA Prox  58      16                                         +----------+--------+--------+--------+------------------+--------+ ICA Mid   62      27      Normal                             +----------+--------+--------+--------+------------------+--------+ ICA Distal71      28                                         +----------+--------+--------+--------+------------------+--------+ ECA       96      24                                         +----------+--------+--------+--------+------------------+--------+ +----------+--------+-------+----------------+-------------------+           PSV cm/sEDV cmsDescribe        Arm Pressure (mmHG) +----------+--------+-------+----------------+-------------------+ Subclavian180            Mildly turbulent151                 +----------+--------+-------+----------------+-------------------+ +---------+--------+--+--------+-+ VertebralPSV cm/s25EDV cm/s6 +---------+--------+--+--------+-+  Left Carotid Findings: +----------+--------+--------+--------+------------------+--------+           PSV cm/sEDV cm/sStenosisPlaque DescriptionComments +----------+--------+--------+--------+------------------+--------+ CCA Prox  100     28                                         +----------+--------+--------+--------+------------------+--------+ CCA Distal73      19                                         +----------+--------+--------+--------+------------------+--------+ ICA Prox  58      21                                         +----------+--------+--------+--------+------------------+--------+ ICA Mid   71      29      Normal                             +----------+--------+--------+--------+------------------+--------+ ICA Distal81      34                                          +----------+--------+--------+--------+------------------+--------+ ECA       99      20                                         +----------+--------+--------+--------+------------------+--------+ +----------+--------+--------+----------------+-------------------+           PSV cm/sEDV cm/sDescribe  Arm Pressure (mmHG) +----------+--------+--------+----------------+-------------------+ Subclavian101             Multiphasic, [id number]                 +----------+--------+--------+----------------+-------------------+ +---------+--------+--+--------+--+---------+ VertebralPSV cm/s63EDV cm/s21Antegrade +---------+--------+--+--------+--+---------+   Summary: Right Carotid: There was no evidence of thrombus, dissection, atherosclerotic                plaque or stenosis in the cervical carotid system. Left Carotid: There was no evidence of thrombus, dissection, atherosclerotic               plaque or stenosis in the cervical carotid system. Vertebrals:  Bilateral vertebral arteries demonstrate antegrade flow. Subclavians: Right subclavian artery flow was mildly disturbed. Normal flow              hemodynamics were seen in the left subclavian artery. *See table(s) above for measurements and observations.  Electronically signed by Ena Dawley MD on 04/23/2020 at 4:08:37 PM.    Final     Assessment & Plan:   Problem List Items Addressed This Visit     Atrial fibrillation ([place])    Toprol XL, ASA      CAD (coronary artery disease)    On Kill oil       Dyslipidemia   Rosacea    Continue on Doxycycline      Sinusitis, chronic    Try Afrin prn      SVT (supraventricular tachycardia) (HCC)    No SVT lately On Metoprolol      Other Visit Diagnoses     Bladder neck obstruction

## 2021-06-02 NOTE — Patient Instructions (Signed)
Try Afrin as needed

## 2021-09-24 ENCOUNTER — Other Ambulatory Visit: Payer: Self-pay | Admitting: Internal Medicine

## 2021-12-01 ENCOUNTER — Encounter: Payer: Self-pay | Admitting: Internal Medicine

## 2021-12-01 ENCOUNTER — Ambulatory Visit (INDEPENDENT_AMBULATORY_CARE_PROVIDER_SITE_OTHER): Payer: Medicare Other | Admitting: Internal Medicine

## 2021-12-01 DIAGNOSIS — I471 Supraventricular tachycardia: Secondary | ICD-10-CM | POA: Diagnosis not present

## 2021-12-01 DIAGNOSIS — I10 Essential (primary) hypertension: Secondary | ICD-10-CM

## 2021-12-01 DIAGNOSIS — J4520 Mild intermittent asthma, uncomplicated: Secondary | ICD-10-CM | POA: Diagnosis not present

## 2021-12-01 MED ORDER — METOPROLOL SUCCINATE ER 50 MG PO TB24: 50.0000 mg | ORAL_TABLET | Freq: Two times a day (BID) | ORAL | 3 refills | Status: DC

## 2021-12-01 MED ORDER — DOXYCYCLINE HYCLATE 100 MG PO TABS
100.0000 mg | ORAL_TABLET | Freq: Two times a day (BID) | ORAL | 3 refills | Status: DC
Start: 2021-12-01 — End: 2021-12-01

## 2021-12-01 MED ORDER — DOXYCYCLINE HYCLATE 100 MG PO TABS
100.0000 mg | ORAL_TABLET | Freq: Two times a day (BID) | ORAL | 3 refills | Status: DC
Start: 2021-12-01 — End: 2023-01-02

## 2021-12-01 NOTE — Progress Notes (Signed)
? ?Subjective:  ?Patient ID: John Boone, male    DOB: Nov 17, 1950  Age: 71 y.o. MRN: [id number] ? ?CC: No chief complaint on file. ? ? ?HPI ?[name] presents for palpitations, rosacea, HTN f/u ? ?Outpatient Medications Prior to Visit  ?Medication Sig Dispense Refill  ? albuterol (PROVENTIL HFA) 108 (90 Base) MCG/ACT inhaler Inhale 2 puffs into the lungs 4 (four) times daily as needed for shortness of breath. 1 Inhaler 11  ? aspirin EC 81 MG tablet Take 1 tablet (81 mg total) by mouth daily. 90 tablet 3  ? Cholecalciferol (VITAMIN D3) 50 MCG (2000 UT) capsule Take 1 capsule (2,000 Units total) by mouth daily. 100 capsule 3  ? Omega-3 Fatty Acids (FISH OIL TRIPLE STRENGTH) 1400 MG CAPS Take 1 capsule by mouth daily.    ? metoprolol succinate (TOPROL-XL) 50 MG 24 hr tablet Take 1 tablet (50 mg total) by mouth 2 (two) times daily. 180 tablet 3  ? doxycycline (VIBRA-TABS) 100 MG tablet TAKE 1 TABLET BY MOUTH TWICE A DAY 180 tablet 3  ? ?No facility-administered medications prior to visit.  ? ? ?ROS: ?Review of Systems  ?Constitutional:  Negative for appetite change, fatigue and unexpected weight change.  ?HENT:  Negative for congestion, nosebleeds, sneezing, sore throat and trouble swallowing.   ?Eyes:  Negative for itching and visual disturbance.  ?Respiratory:  Negative for cough.   ?Cardiovascular:  Positive for palpitations. Negative for chest pain and leg swelling.  ?Gastrointestinal:  Negative for abdominal distention, blood in stool, diarrhea and nausea.  ?Genitourinary:  Negative for frequency and hematuria.  ?Musculoskeletal:  Negative for back pain, gait problem, joint swelling and neck pain.  ?Skin:  Negative for rash.  ?Neurological:  Negative for dizziness, tremors, speech difficulty and weakness.  ?Psychiatric/Behavioral:  Negative for agitation, dysphoric mood and sleep disturbance. The patient is not nervous/anxious.   ? ?Objective:  ?BP (!) 158/96 (BP Location: Left Arm, Patient Position: Sitting,  Cuff Size: Large)   Pulse 82   Temp 98.1 ?F (36.7 ?C) (Oral)   Ht '6\' 2"'$  (1.88 m)   Wt 250 lb (113.4 kg)   SpO2 98%   BMI 32.10 kg/m?  ? ?BP Readings from Last 3 Encounters:  ?12/01/21 (!) 158/96  ?06/02/21 (!) 141/98  ?11/18/20 130/86  ? ? ?Wt Readings from Last 3 Encounters:  ?12/01/21 250 lb (113.4 kg)  ?06/02/21 242 lb 9.6 oz (110 kg)  ?11/18/20 229 lb (103.9 kg)  ? ? ?Physical Exam ?Constitutional:   ?   General: He is not in acute distress. ?   Appearance: He is well-developed.  ?   Comments: NAD  ?Eyes:  ?   Conjunctiva/sclera: Conjunctivae normal.  ?   Pupils: Pupils are equal, round, and reactive to light.  ?Neck:  ?   Thyroid: No thyromegaly.  ?   Vascular: No JVD.  ?Cardiovascular:  ?   Rate and Rhythm: Normal rate and regular rhythm.  ?   Heart sounds: Normal heart sounds. No murmur heard. ?  No friction rub. No gallop.  ?Pulmonary:  ?   Effort: Pulmonary effort is normal. No respiratory distress.  ?   Breath sounds: Normal breath sounds. No wheezing or rales.  ?Chest:  ?   Chest wall: No tenderness.  ?Abdominal:  ?   General: Bowel sounds are normal. There is no distension.  ?   Palpations: Abdomen is soft. There is no mass.  ?   Tenderness: There is no abdominal tenderness. There  is no guarding or rebound.  ?Musculoskeletal:     ?   General: No tenderness. Normal range of motion.  ?   Cervical back: Normal range of motion.  ?Lymphadenopathy:  ?   Cervical: No cervical adenopathy.  ?Skin: ?   General: Skin is warm and dry.  ?   Findings: No rash.  ?Neurological:  ?   Mental Status: He is alert and oriented to person, place, and time.  ?   Cranial Nerves: No cranial nerve deficit.  ?   Motor: No abnormal muscle tone.  ?   Coordination: Coordination normal.  ?   Gait: Gait normal.  ?   Deep Tendon Reflexes: Reflexes are normal and symmetric.  ?Psychiatric:     ?   Behavior: Behavior normal.     ?   Thought Content: Thought content normal.     ?   Judgment: Judgment normal.  ? ? ?Lab Results   ?Component Value Date  ? WBC 6.4 06/02/2021  ? HGB 15.5 06/02/2021  ? HCT 47.0 06/02/2021  ? PLT 197.0 06/02/2021  ? GLUCOSE 89 06/02/2021  ? CHOL 218 (H) 06/02/2021  ? TRIG 156.0 (H) 06/02/2021  ? HDL 50.20 06/02/2021  ? LDLCALC 137 (H) 06/02/2021  ? ALT 17 06/02/2021  ? AST 16 06/02/2021  ? NA 142 06/02/2021  ? K 4.7 06/02/2021  ? CL 106 06/02/2021  ? CREATININE 0.93 06/02/2021  ? BUN 18 06/02/2021  ? CO2 30 06/02/2021  ? TSH 1.15 06/02/2021  ? PSA 0.94 06/02/2021  ? ? ?VAS US CAROTID ? ?Result Date: 04/23/2020 ?Carotid Arterial Duplex Study Indications:       Bilateral bruits and patient c/o double vision but relates it                    to having cataracts. He denies any other cerebrovascular                    symptoms. Risk Factors:      Hypertension, hyperlipidemia, past history of smoking,                    coronary artery disease. Comparison Study:  NA Performing Technologist: Sharlett Iles RVT  Examination Guidelines: A complete evaluation includes B-mode imaging, spectral Doppler, color Doppler, and power Doppler as needed of all accessible portions of each vessel. Bilateral testing is considered an integral part of a complete examination. Limited examinations for reoccurring indications may be performed as noted.  Right Carotid Findings: +----------+--------+--------+--------+------------------+--------+           PSV cm/sEDV cm/sStenosisPlaque DescriptionComments +----------+--------+--------+--------+------------------+--------+ CCA Prox  91      17                                         +----------+--------+--------+--------+------------------+--------+ CCA Distal79      23                                         +----------+--------+--------+--------+------------------+--------+ ICA Prox  58      16                                         +----------+--------+--------+--------+------------------+--------+  ICA Mid   62      27      Normal                              +----------+--------+--------+--------+------------------+--------+ ICA Distal71      28                                         +----------+--------+--------+--------+------------------+--------+ ECA       96      24                                         +----------+--------+--------+--------+------------------+--------+ +----------+--------+-------+----------------+-------------------+           PSV cm/sEDV cmsDescribe        Arm Pressure (mmHG) +----------+--------+-------+----------------+-------------------+ Subclavian180            Mildly turbulent151                 +----------+--------+-------+----------------+-------------------+ +---------+--------+--+--------+-+ VertebralPSV cm/s25EDV cm/s6 +---------+--------+--+--------+-+  Left Carotid Findings: +----------+--------+--------+--------+------------------+--------+           PSV cm/sEDV cm/sStenosisPlaque DescriptionComments +----------+--------+--------+--------+------------------+--------+ CCA Prox  100     28                                         +----------+--------+--------+--------+------------------+--------+ CCA Distal73      19                                         +----------+--------+--------+--------+------------------+--------+ ICA Prox  58      21                                         +----------+--------+--------+--------+------------------+--------+ ICA Mid   71      29      Normal                             +----------+--------+--------+--------+------------------+--------+ ICA Distal81      34                                         +----------+--------+--------+--------+------------------+--------+ ECA       99      20                                         +----------+--------+--------+--------+------------------+--------+ +----------+--------+--------+----------------+-------------------+           PSV cm/sEDV cm/sDescribe        Arm  Pressure (mmHG) +----------+--------+--------+----------------+-------------------+ Subclavian101             Multiphasic, [id number]                 +----------+--------+--------+----------------+-------------------+ +---------+--------+--+--------+--+---------+ VertebralPSV cm/s63EDV cm/s21Antegrade +---------+--------+--+--------+--+----

## 2021-12-01 NOTE — Assessment & Plan Note (Signed)
Chronic  ?SBP at home 130's-140 ?Toprol XL ?

## 2021-12-01 NOTE — Assessment & Plan Note (Signed)
Cont on Toprol XL ?

## 2021-12-01 NOTE — Assessment & Plan Note (Signed)
Doing well ?Not using MDI x 3 years ?

## 2022-01-28 ENCOUNTER — Encounter (INDEPENDENT_AMBULATORY_CARE_PROVIDER_SITE_OTHER): Payer: Medicare Other | Admitting: Ophthalmology

## 2022-01-28 DIAGNOSIS — H43813 Vitreous degeneration, bilateral: Secondary | ICD-10-CM

## 2022-01-28 DIAGNOSIS — H4423 Degenerative myopia, bilateral: Secondary | ICD-10-CM | POA: Diagnosis not present

## 2022-01-28 DIAGNOSIS — H35033 Hypertensive retinopathy, bilateral: Secondary | ICD-10-CM | POA: Diagnosis not present

## 2022-01-28 DIAGNOSIS — H2513 Age-related nuclear cataract, bilateral: Secondary | ICD-10-CM

## 2022-01-28 DIAGNOSIS — I10 Essential (primary) hypertension: Secondary | ICD-10-CM

## 2022-05-27 ENCOUNTER — Encounter: Payer: Self-pay | Admitting: Internal Medicine

## 2022-05-27 ENCOUNTER — Ambulatory Visit (INDEPENDENT_AMBULATORY_CARE_PROVIDER_SITE_OTHER): Payer: Medicare Other | Admitting: Internal Medicine

## 2022-05-27 VITALS — BP 132/84 | HR 92 | Temp 99.3°F | Ht 74.0 in | Wt 236.8 lb

## 2022-05-27 DIAGNOSIS — J4520 Mild intermittent asthma, uncomplicated: Secondary | ICD-10-CM

## 2022-05-27 DIAGNOSIS — J069 Acute upper respiratory infection, unspecified: Secondary | ICD-10-CM | POA: Diagnosis not present

## 2022-05-27 DIAGNOSIS — J029 Acute pharyngitis, unspecified: Secondary | ICD-10-CM

## 2022-05-27 LAB — POCT RAPID STREP A (OFFICE): Rapid Strep A Screen: NEGATIVE

## 2022-05-27 MED ORDER — CEFUROXIME AXETIL 250 MG PO TABS: 250.0000 mg | ORAL_TABLET | Freq: Two times a day (BID) | ORAL | 0 refills | Status: AC

## 2022-05-27 MED ORDER — ALBUTEROL SULFATE HFA 108 (90 BASE) MCG/ACT IN AERS: 2.0000 | INHALATION_SPRAY | Freq: Four times a day (QID) | RESPIRATORY_TRACT | 5 refills | Status: AC | PRN

## 2022-05-27 MED ORDER — HYDROCODONE BIT-HOMATROP MBR 5-1.5 MG/5ML PO SOLN: 5.0000 mL | Freq: Four times a day (QID) | ORAL | 0 refills | Status: AC | PRN

## 2022-05-27 NOTE — Assessment & Plan Note (Signed)
Hycodan prn Ceftin 250 mg po x 7 d

## 2022-05-27 NOTE — Progress Notes (Addendum)
Cosign for Strep test../lmb  Medical screening examination/treatment/procedure(s) were performed by non-physician practitioner and as supervising physician I was immediately available for consultation/collaboration.  I agree with above. Lew Dawes, MD

## 2022-05-27 NOTE — Addendum Note (Signed)
Addended by: Earnstine Regal on: 05/27/2022 12:01 PM   Modules accepted: Orders

## 2022-05-27 NOTE — Assessment & Plan Note (Signed)
Albuterol MDI

## 2022-05-27 NOTE — Progress Notes (Signed)
Subjective:  Patient ID: John Boone, male    DOB: 1951-04-24  Age: 71 y.o. MRN: [id number]  CC: Cough (Pt states have some chest congestion. He has taken 2 covid test both negative) and Sore Throat   HPI John Boone presents for cough and some chest congestion. He has taken 2 covid test both negative. C/o ST, fever  Outpatient Medications Prior to Visit  Medication Sig Dispense Refill   aspirin EC 81 MG tablet Take 1 tablet (81 mg total) by mouth daily. 90 tablet 3   Cholecalciferol (VITAMIN D3) 50 MCG (2000 UT) capsule Take 1 capsule (2,000 Units total) by mouth daily. 100 capsule 3   doxycycline (VIBRA-TABS) 100 MG tablet Take 1 tablet (100 mg total) by mouth 2 (two) times daily. 180 tablet 3   metoprolol succinate (TOPROL-XL) 50 MG 24 hr tablet Take 1 tablet (50 mg total) by mouth 2 (two) times daily. 180 tablet 3   Omega-3 Fatty Acids (FISH OIL TRIPLE STRENGTH) 1400 MG CAPS Take 1 capsule by mouth daily.     albuterol (PROVENTIL HFA) 108 (90 Base) MCG/ACT inhaler Inhale 2 puffs into the lungs 4 (four) times daily as needed for shortness of breath. 1 Inhaler 11   No facility-administered medications prior to visit.    ROS: Review of Systems  Constitutional:  Positive for fatigue and fever. Negative for appetite change and unexpected weight change.  HENT:  Positive for sore throat. Negative for congestion, nosebleeds, sneezing and trouble swallowing.   Eyes:  Negative for itching and visual disturbance.  Respiratory:  Positive for cough.   Cardiovascular:  Negative for chest pain, palpitations and leg swelling.  Gastrointestinal:  Negative for abdominal distention, blood in stool, diarrhea and nausea.  Genitourinary:  Negative for frequency and hematuria.  Musculoskeletal:  Positive for arthralgias. Negative for back pain, gait problem, joint swelling and neck pain.  Skin:  Negative for rash.  Neurological:  Negative for dizziness, tremors, speech difficulty and weakness.   Psychiatric/Behavioral:  Negative for agitation, dysphoric mood and sleep disturbance. The patient is not nervous/anxious.   Eryth throat  Objective:  BP 132/84 (BP Location: Left Arm)   Pulse 92   Temp 99.3 F (37.4 C) (Oral)   Ht '6\' 2"'$  (1.88 m)   Wt 236 lb 12.8 oz (107.4 kg)   SpO2 97%   BMI 30.40 kg/m   BP Readings from Last 3 Encounters:  05/27/22 132/84  12/01/21 (!) 158/96  06/02/21 (!) 141/98    Wt Readings from Last 3 Encounters:  05/27/22 236 lb 12.8 oz (107.4 kg)  12/01/21 250 lb (113.4 kg)  06/02/21 242 lb 9.6 oz (110 kg)    Physical Exam Constitutional:      General: He is not in acute distress.    Appearance: He is well-developed.     Comments: NAD  Eyes:     Conjunctiva/sclera: Conjunctivae normal.     Pupils: Pupils are equal, round, and reactive to light.  Neck:     Thyroid: No thyromegaly.     Vascular: No JVD.  Cardiovascular:     Rate and Rhythm: Normal rate and regular rhythm.     Heart sounds: Normal heart sounds. No murmur heard.    No friction rub. No gallop.  Pulmonary:     Effort: Pulmonary effort is normal. No respiratory distress.     Breath sounds: Normal breath sounds. No wheezing or rales.  Chest:     Chest wall: No tenderness.  Abdominal:  General: Bowel sounds are normal. There is no distension.     Palpations: Abdomen is soft. There is no mass.     Tenderness: There is no abdominal tenderness. There is no guarding or rebound.  Musculoskeletal:        General: No tenderness. Normal range of motion.     Cervical back: Normal range of motion.  Lymphadenopathy:     Cervical: No cervical adenopathy.  Skin:    General: Skin is warm and dry.     Findings: No rash.  Neurological:     Mental Status: He is alert and oriented to person, place, and time.     Cranial Nerves: No cranial nerve deficit.     Motor: No abnormal muscle tone.     Coordination: Coordination normal.     Gait: Gait normal.     Deep Tendon Reflexes:  Reflexes are normal and symmetric.  Psychiatric:        Behavior: Behavior normal.        Thought Content: Thought content normal.        Judgment: Judgment normal.     Lab Results  Component Value Date   WBC 6.4 06/02/2021   HGB 15.5 06/02/2021   HCT 47.0 06/02/2021   PLT 197.0 06/02/2021   GLUCOSE 89 06/02/2021   CHOL 218 (H) 06/02/2021   TRIG 156.0 (H) 06/02/2021   HDL 50.20 06/02/2021   LDLCALC 137 (H) 06/02/2021   ALT 17 06/02/2021   AST 16 06/02/2021   NA 142 06/02/2021   K 4.7 06/02/2021   CL 106 06/02/2021   CREATININE 0.93 06/02/2021   BUN 18 06/02/2021   CO2 30 06/02/2021   TSH 1.15 06/02/2021   PSA 0.94 06/02/2021    VAS US CAROTID  Result Date: 04/23/2020 Carotid Arterial Duplex Study Indications:       Bilateral bruits and patient c/o double vision but relates it                    to having cataracts. He denies any other cerebrovascular                    symptoms. Risk Factors:      Hypertension, hyperlipidemia, past history of smoking,                    coronary artery disease. Comparison Study:  NA Performing Technologist: Sharlett Iles RVT  Examination Guidelines: A complete evaluation includes B-mode imaging, spectral Doppler, color Doppler, and power Doppler as needed of all accessible portions of each vessel. Bilateral testing is considered an integral part of a complete examination. Limited examinations for reoccurring indications may be performed as noted.  Right Carotid Findings: +----------+--------+--------+--------+------------------+--------+           PSV cm/sEDV cm/sStenosisPlaque DescriptionComments +----------+--------+--------+--------+------------------+--------+ CCA Prox  91      17                                         +----------+--------+--------+--------+------------------+--------+ CCA Distal79      23                                         +----------+--------+--------+--------+------------------+--------+ ICA Prox   58      16                                         +----------+--------+--------+--------+------------------+--------+  ICA Mid   62      27      Normal                             +----------+--------+--------+--------+------------------+--------+ ICA Distal71      28                                         +----------+--------+--------+--------+------------------+--------+ ECA       96      24                                         +----------+--------+--------+--------+------------------+--------+ +----------+--------+-------+----------------+-------------------+           PSV cm/sEDV cmsDescribe        Arm Pressure (mmHG) +----------+--------+-------+----------------+-------------------+ Subclavian180            Mildly turbulent151                 +----------+--------+-------+----------------+-------------------+ +---------+--------+--+--------+-+ VertebralPSV cm/s25EDV cm/s6 +---------+--------+--+--------+-+  Left Carotid Findings: +----------+--------+--------+--------+------------------+--------+           PSV cm/sEDV cm/sStenosisPlaque DescriptionComments +----------+--------+--------+--------+------------------+--------+ CCA Prox  100     28                                         +----------+--------+--------+--------+------------------+--------+ CCA Distal73      19                                         +----------+--------+--------+--------+------------------+--------+ ICA Prox  58      21                                         +----------+--------+--------+--------+------------------+--------+ ICA Mid   71      29      Normal                             +----------+--------+--------+--------+------------------+--------+ ICA Distal81      34                                         +----------+--------+--------+--------+------------------+--------+ ECA       99      20                                          +----------+--------+--------+--------+------------------+--------+ +----------+--------+--------+----------------+-------------------+           PSV cm/sEDV cm/sDescribe        Arm Pressure (mmHG) +----------+--------+--------+----------------+-------------------+ Subclavian101             Multiphasic, [id number]                 +----------+--------+--------+----------------+-------------------+ +---------+--------+--+--------+--+---------+ VertebralPSV cm/s63EDV cm/s21Antegrade +---------+--------+--+--------+--+---------+   Summary: Right Carotid: There  was no evidence of thrombus, dissection, atherosclerotic                plaque or stenosis in the cervical carotid system. Left Carotid: There was no evidence of thrombus, dissection, atherosclerotic               plaque or stenosis in the cervical carotid system. Vertebrals:  Bilateral vertebral arteries demonstrate antegrade flow. Subclavians: Right subclavian artery flow was mildly disturbed. Normal flow              hemodynamics were seen in the left subclavian artery. *See table(s) above for measurements and observations.  Electronically signed by Ena Dawley MD on 04/23/2020 at 4:08:37 PM.    Final     Assessment & Plan:   Problem List Items Addressed This Visit     Asthma, mild intermittent    Albuterol MDI       Relevant Medications   albuterol (PROVENTIL HFA) 108 (90 Base) MCG/ACT inhaler   Upper respiratory infection    Hycodan prn Ceftin 250 mg po x 7 d        Relevant Medications   cefUROXime (CEFTIN) 250 MG tablet      Meds ordered this encounter  Medications   albuterol (PROVENTIL HFA) 108 (90 Base) MCG/ACT inhaler    Sig: Inhale 2 puffs into the lungs 4 (four) times daily as needed for shortness of breath.    Dispense:  1 each    Refill:  5   HYDROcodone bit-homatropine (HYCODAN) 5-1.5 MG/5ML syrup    Sig: Take 5 mLs by mouth every 6 (six) hours as needed for up to 10 days for cough.    Dispense:   240 mL    Refill:  0   cefUROXime (CEFTIN) 250 MG tablet    Sig: Take 1 tablet (250 mg total) by mouth 2 (two) times daily with a meal for 10 days.    Dispense:  14 tablet    Refill:  0      Follow-up: No follow-ups on file.  Walker Kehr, MD

## 2022-06-02 ENCOUNTER — Ambulatory Visit: Payer: Medicare Other | Admitting: Internal Medicine

## 2022-08-26 ENCOUNTER — Ambulatory Visit: Payer: Medicare Other | Admitting: Internal Medicine

## 2022-08-31 ENCOUNTER — Ambulatory Visit (INDEPENDENT_AMBULATORY_CARE_PROVIDER_SITE_OTHER): Payer: Medicare Other | Admitting: Internal Medicine

## 2022-08-31 ENCOUNTER — Ambulatory Visit (INDEPENDENT_AMBULATORY_CARE_PROVIDER_SITE_OTHER): Payer: Medicare Other

## 2022-08-31 ENCOUNTER — Encounter: Payer: Self-pay | Admitting: Internal Medicine

## 2022-08-31 VITALS — BP 126/74 | HR 85 | Temp 99.1°F | Ht 74.0 in | Wt 245.0 lb

## 2022-08-31 DIAGNOSIS — H6123 Impacted cerumen, bilateral: Secondary | ICD-10-CM | POA: Diagnosis not present

## 2022-08-31 DIAGNOSIS — H6120 Impacted cerumen, unspecified ear: Secondary | ICD-10-CM | POA: Insufficient documentation

## 2022-08-31 DIAGNOSIS — J069 Acute upper respiratory infection, unspecified: Secondary | ICD-10-CM

## 2022-08-31 DIAGNOSIS — J4520 Mild intermittent asthma, uncomplicated: Secondary | ICD-10-CM

## 2022-08-31 LAB — POC COVID19 BINAXNOW: SARS Coronavirus 2 Ag: NEGATIVE

## 2022-08-31 MED ORDER — METHYLPREDNISOLONE 4 MG PO TBPK
ORAL_TABLET | ORAL | 0 refills | Status: DC
Start: 2022-08-31 — End: 2022-09-23

## 2022-08-31 MED ORDER — CEFDINIR 300 MG PO CAPS: 300.0000 mg | ORAL_CAPSULE | Freq: Two times a day (BID) | ORAL | 0 refills | Status: DC

## 2022-08-31 NOTE — Assessment & Plan Note (Signed)
B - irrigate later at home

## 2022-08-31 NOTE — Assessment & Plan Note (Signed)
Worse R/o CAP - CXR Use Ventolin qid Medrol pack Cefdinir po x 10 d

## 2022-08-31 NOTE — Progress Notes (Signed)
Subjective:  Patient ID: John Boone, male    DOB: Aug 27, 1951  Age: 72 y.o. MRN: [id number]  CC: Follow-up (Had fever , having trouble breathing , lung hurt from coughing . Ear pain)   HPI John Boone presents for fever 103 x 3 d, cough, SOB since Sat.  Outpatient Medications Prior to Visit  Medication Sig Dispense Refill   albuterol (PROVENTIL HFA) 108 (90 Base) MCG/ACT inhaler Inhale 2 puffs into the lungs 4 (four) times daily as needed for shortness of breath. 1 each 5   aspirin EC 81 MG tablet Take 1 tablet (81 mg total) by mouth daily. 90 tablet 3   Cholecalciferol (VITAMIN D3) 50 MCG (2000 UT) capsule Take 1 capsule (2,000 Units total) by mouth daily. 100 capsule 3   doxycycline (VIBRA-TABS) 100 MG tablet Take 1 tablet (100 mg total) by mouth 2 (two) times daily. 180 tablet 3   metoprolol succinate (TOPROL-XL) 50 MG 24 hr tablet Take 1 tablet (50 mg total) by mouth 2 (two) times daily. 180 tablet 3   Omega-3 Fatty Acids (FISH OIL TRIPLE STRENGTH) 1400 MG CAPS Take 1 capsule by mouth daily.     No facility-administered medications prior to visit.    ROS: Review of Systems  Constitutional:  Positive for chills and fever. Negative for appetite change, fatigue and unexpected weight change.  HENT:  Positive for congestion, postnasal drip, rhinorrhea and sinus pain. Negative for nosebleeds, sneezing, sore throat and trouble swallowing.   Eyes:  Negative for itching and visual disturbance.  Respiratory:  Positive for cough, chest tightness, shortness of breath and wheezing.   Cardiovascular:  Negative for chest pain, palpitations and leg swelling.  Gastrointestinal:  Negative for abdominal distention, blood in stool, diarrhea and nausea.  Genitourinary:  Negative for frequency and hematuria.  Musculoskeletal:  Negative for back pain, gait problem, joint swelling and neck pain.  Skin:  Negative for rash.  Neurological:  Negative for dizziness, tremors, speech difficulty and  weakness.  Psychiatric/Behavioral:  Negative for agitation, dysphoric mood and sleep disturbance. The patient is not nervous/anxious.     Objective:  BP 126/74 (BP Location: Right Arm, Patient Position: Sitting, Cuff Size: Normal)   Pulse 85   Temp 99.1 F (37.3 C) (Oral)   Ht '6\' 2"'$  (1.88 m)   Wt 245 lb (111.1 kg)   SpO2 99%   BMI 31.46 kg/m   BP Readings from Last 3 Encounters:  08/31/22 126/74  05/27/22 132/84  12/01/21 (!) 158/96    Wt Readings from Last 3 Encounters:  08/31/22 245 lb (111.1 kg)  05/27/22 236 lb 12.8 oz (107.4 kg)  12/01/21 250 lb (113.4 kg)    Physical Exam Constitutional:      General: He is not in acute distress.    Appearance: Normal appearance. He is well-developed. He is not ill-appearing.     Comments: NAD  Eyes:     Conjunctiva/sclera: Conjunctivae normal.     Pupils: Pupils are equal, round, and reactive to light.  Neck:     Thyroid: No thyromegaly.     Vascular: No JVD.  Cardiovascular:     Rate and Rhythm: Normal rate and regular rhythm.     Heart sounds: Normal heart sounds. No murmur heard.    No friction rub. No gallop.  Pulmonary:     Effort: Pulmonary effort is normal. No respiratory distress.     Breath sounds: Normal breath sounds. No wheezing or rales.  Chest:  Chest wall: No tenderness.  Abdominal:     General: Bowel sounds are normal. There is no distension.     Palpations: Abdomen is soft. There is no mass.     Tenderness: There is no abdominal tenderness. There is no guarding or rebound.  Musculoskeletal:        General: No tenderness. Normal range of motion.     Cervical back: Normal range of motion.  Lymphadenopathy:     Cervical: No cervical adenopathy.  Skin:    General: Skin is warm and dry.     Findings: No rash.  Neurological:     Mental Status: He is alert and oriented to person, place, and time.     Cranial Nerves: No cranial nerve deficit.     Motor: No abnormal muscle tone.     Coordination:  Coordination normal.     Gait: Gait normal.     Deep Tendon Reflexes: Reflexes are normal and symmetric.  Psychiatric:        Behavior: Behavior normal.        Thought Content: Thought content normal.        Judgment: Judgment normal.   Eryth nasal B wax R>L rhonchi  COVID (-)  Lab Results  Component Value Date   WBC 6.4 06/02/2021   HGB 15.5 06/02/2021   HCT 47.0 06/02/2021   PLT 197.0 06/02/2021   GLUCOSE 89 06/02/2021   CHOL 218 (H) 06/02/2021   TRIG 156.0 (H) 06/02/2021   HDL 50.20 06/02/2021   LDLCALC 137 (H) 06/02/2021   ALT 17 06/02/2021   AST 16 06/02/2021   NA 142 06/02/2021   K 4.7 06/02/2021   CL 106 06/02/2021   CREATININE 0.93 06/02/2021   BUN 18 06/02/2021   CO2 30 06/02/2021   TSH 1.15 06/02/2021   PSA 0.94 06/02/2021    VAS US CAROTID  Result Date: 04/23/2020 Carotid Arterial Duplex Study Indications:       Bilateral bruits and patient c/o double vision but relates it                    to having cataracts. He denies any other cerebrovascular                    symptoms. Risk Factors:      Hypertension, hyperlipidemia, past history of smoking,                    coronary artery disease. Comparison Study:  NA Performing Technologist: Sharlett Iles RVT  Examination Guidelines: A complete evaluation includes B-mode imaging, spectral Doppler, color Doppler, and power Doppler as needed of all accessible portions of each vessel. Bilateral testing is considered an integral part of a complete examination. Limited examinations for reoccurring indications may be performed as noted.  Right Carotid Findings: +----------+--------+--------+--------+------------------+--------+           PSV cm/sEDV cm/sStenosisPlaque DescriptionComments +----------+--------+--------+--------+------------------+--------+ CCA Prox  91      17                                         +----------+--------+--------+--------+------------------+--------+ CCA Distal79      23                                          +----------+--------+--------+--------+------------------+--------+  ICA Prox  58      16                                         +----------+--------+--------+--------+------------------+--------+ ICA Mid   62      27      Normal                             +----------+--------+--------+--------+------------------+--------+ ICA Distal71      28                                         +----------+--------+--------+--------+------------------+--------+ ECA       96      24                                         +----------+--------+--------+--------+------------------+--------+ +----------+--------+-------+----------------+-------------------+           PSV cm/sEDV cmsDescribe        Arm Pressure (mmHG) +----------+--------+-------+----------------+-------------------+ Subclavian180            Mildly turbulent151                 +----------+--------+-------+----------------+-------------------+ +---------+--------+--+--------+-+ VertebralPSV cm/s25EDV cm/s6 +---------+--------+--+--------+-+  Left Carotid Findings: +----------+--------+--------+--------+------------------+--------+           PSV cm/sEDV cm/sStenosisPlaque DescriptionComments +----------+--------+--------+--------+------------------+--------+ CCA Prox  100     28                                         +----------+--------+--------+--------+------------------+--------+ CCA Distal73      19                                         +----------+--------+--------+--------+------------------+--------+ ICA Prox  58      21                                         +----------+--------+--------+--------+------------------+--------+ ICA Mid   71      29      Normal                             +----------+--------+--------+--------+------------------+--------+ ICA Distal81      34                                          +----------+--------+--------+--------+------------------+--------+ ECA       99      20                                         +----------+--------+--------+--------+------------------+--------+ +----------+--------+--------+----------------+-------------------+           PSV cm/sEDV cm/sDescribe  Arm Pressure (mmHG) +----------+--------+--------+----------------+-------------------+ Subclavian101             Multiphasic, [id number]                 +----------+--------+--------+----------------+-------------------+ +---------+--------+--+--------+--+---------+ VertebralPSV cm/s63EDV cm/s21Antegrade +---------+--------+--+--------+--+---------+   Summary: Right Carotid: There was no evidence of thrombus, dissection, atherosclerotic                plaque or stenosis in the cervical carotid system. Left Carotid: There was no evidence of thrombus, dissection, atherosclerotic               plaque or stenosis in the cervical carotid system. Vertebrals:  Bilateral vertebral arteries demonstrate antegrade flow. Subclavians: Right subclavian artery flow was mildly disturbed. Normal flow              hemodynamics were seen in the left subclavian artery. *See table(s) above for measurements and observations.  Electronically signed by Ena Dawley MD on 04/23/2020 at 4:08:37 PM.    Final     Assessment & Plan:   Problem List Items Addressed This Visit     Upper respiratory infection - Primary    New R/o CAP Cefdinir po x 10 d Hold Doxy      Relevant Medications   cefdinir (OMNICEF) 300 MG capsule   Other Relevant Orders   DG Chest 2 View   Cerumen impaction    B - irrigate later at home      Asthma, mild intermittent    Worse R/o CAP - CXR Use Ventolin qid Medrol pack Cefdinir po x 10 d      Relevant Medications   methylPREDNISolone (MEDROL DOSEPAK) 4 MG TBPK tablet      Meds ordered this encounter  Medications   cefdinir (OMNICEF) 300 MG capsule    Sig: Take  1 capsule (300 mg total) by mouth 2 (two) times daily.    Dispense:  20 capsule    Refill:  0   methylPREDNISolone (MEDROL DOSEPAK) 4 MG TBPK tablet    Sig: As directed    Dispense:  21 tablet    Refill:  0      Follow-up: No follow-ups on file.  Walker Kehr, MD

## 2022-08-31 NOTE — Assessment & Plan Note (Addendum)
New R/o CAP Cefdinir po x 10 d Hold Doxy

## 2022-08-31 NOTE — Addendum Note (Signed)
Addended by: Basil Dess on: 08/31/2022 11:19 AM   Modules accepted: Orders

## 2022-09-23 ENCOUNTER — Ambulatory Visit (INDEPENDENT_AMBULATORY_CARE_PROVIDER_SITE_OTHER): Payer: Medicare Other | Admitting: Nurse Practitioner

## 2022-09-23 VITALS — BP 134/88 | HR 94 | Temp 98.3°F | Ht 74.0 in | Wt 241.4 lb

## 2022-09-23 DIAGNOSIS — R002 Palpitations: Secondary | ICD-10-CM | POA: Diagnosis not present

## 2022-09-23 LAB — BASIC METABOLIC PANEL
BUN: 17 mg/dL (ref 6–23)
CO2: 27 mEq/L (ref 19–32)
Calcium: 9.3 mg/dL (ref 8.4–10.5)
Chloride: 107 mEq/L (ref 96–112)
Creatinine, Ser: 1.01 mg/dL (ref 0.40–1.50)
GFR: 75.42 mL/min (ref >60.00)
Glucose, Bld: 95 mg/dL (ref 70–99)
Potassium: 4.1 mEq/L (ref 3.5–5.1)
Sodium: 141 mEq/L (ref 135–145)

## 2022-09-23 NOTE — Progress Notes (Signed)
Established Patient Office Visit  Subjective   Patient ID: John Boone, male    DOB: 1951/03/28  Age: 72 y.o. MRN: [id number]  Chief Complaint  Patient presents with   Palpitations    Patient arrives today for evaluation of palpitations.  Reports that yesterday afternoon around 3:30 PM he started experiencing cardiac palpitations and felt that his heart was racing.  This lasted for approximately 17 hours before it subsided spontaneously. Has history of intermittent palpitations, thought to be related to paroxysmal atrial fibrillation however this has not been verified via cardiac monitoring. Has seen Dr. Stanford Breed with cardiology in the past who recommended continued use of metoprolol at '50mg'$  twice a day vs. Referral for possible ablation. Patient last saw Dr. Stanford Breed 10/2019 and has been stable on metoprolol from a symptomatic standpoint since then. However, patient reports that he has been taking metoprolol '25mg'$  by mouth twice a day as opposed to the recommended '50mg'$ . Only possible trigger prior to this appointment was recent respiratory infection that he is improving from. He did report associated chest tightness, but no pain.     ROS: See HPI    Objective:     BP 134/88   Pulse 94   Temp 98.3 F (36.8 C) (Temporal)   Ht '6\' 2"'$  (1.88 m)   Wt 241 lb 6 oz (109.5 kg)   SpO2 94%   BMI 30.99 kg/m  BP Readings from Last 3 Encounters:  09/23/22 134/88  08/31/22 126/74  05/27/22 132/84   Wt Readings from Last 3 Encounters:  09/23/22 241 lb 6 oz (109.5 kg)  08/31/22 245 lb (111.1 kg)  05/27/22 236 lb 12.8 oz (107.4 kg)      Physical Exam Vitals reviewed.  Constitutional:      Appearance: Normal appearance.  HENT:     Head: Normocephalic and atraumatic.  Cardiovascular:     Rate and Rhythm: Normal rate and regular rhythm.  Pulmonary:     Effort: Pulmonary effort is normal.     Breath sounds: Normal breath sounds.  Musculoskeletal:     Cervical back: Neck supple.   Skin:    General: Skin is warm and dry.  Neurological:     Mental Status: He is alert and oriented to person, place, and time.  Psychiatric:        Mood and Affect: Mood normal.        Behavior: Behavior normal.        Thought Content: Thought content normal.        Judgment: Judgment normal.    EKG: shows NSR with heart rate of and right bundle branch block RBBB- noted on last EKG from 10/2019  Results for orders placed or performed in visit on 22/97/98  Basic metabolic panel  Result Value Ref Range   Sodium 141 135 - 145 mEq/L   Potassium 4.1 3.5 - 5.1 mEq/L   Chloride 107 96 - 112 mEq/L   CO2 27 19 - 32 mEq/L   Glucose, Bld 95 70 - 99 mg/dL   BUN 17 6 - 23 mg/dL   Creatinine, Ser 1.01 0.40 - 1.50 mg/dL   GFR 75.42 >60.00 mL/min   Calcium 9.3 8.4 - 10.5 mg/dL      The 10-year ASCVD risk score (Arnett DK, et al., 2019) is: 23.3%    Assessment & Plan:   Problem List Items Addressed This Visit       Other   Palpitations - Primary    BMP today shows  stable renal function, per shared decision making increase metoprolol to '50mg'$  BID with close f/u in 20 weeks with PCP. If any chest pain, palpitations, shortness of breath, or fatigue occur between now and then patient is to call 911. He reports understanding. Did discuss doing another long-term ambulatory cardiac monitor to see if we can capture arrhythmia, but patient has declined this. He was told to see Dr. Stanford Breed again to see if he needs to undergo ablation at this point. He reports understanding, referral ordered today.       Relevant Orders   Basic metabolic panel (Completed)   Ambulatory referral to Cardiology    Return in about 2 weeks (around 10/07/2022) for F/U wtih Dr. Camila Li (preferred) or Nevaya Nagele .    Ailene Ards, NP

## 2022-09-23 NOTE — Assessment & Plan Note (Signed)
BMP today shows stable renal function, per shared decision making increase metoprolol to '50mg'$  BID with close f/u in 20 weeks with PCP. If any chest pain, palpitations, shortness of breath, or fatigue occur between now and then patient is to call 911. He reports understanding. Did discuss doing another long-term ambulatory cardiac monitor to see if we can capture arrhythmia, but patient has declined this. He was told to see Dr. Stanford Breed again to see if he needs to undergo ablation at this point. He reports understanding, referral ordered today.

## 2022-09-23 NOTE — Patient Instructions (Signed)
Increase metoprolol to 50 mg by mouth twice a day. If you have another episode of prolonged palpitations please go to the ER. Call Dr. Jacalyn Lefevre office to schedule an appointment for further evaluation as well.

## 2022-10-06 ENCOUNTER — Other Ambulatory Visit: Payer: Self-pay

## 2022-10-06 NOTE — Addendum Note (Signed)
Addended by: Ferol Luz, Veronika Heard P on: 10/06/2022 04:50 PM   Modules accepted: Orders

## 2022-10-07 ENCOUNTER — Encounter: Payer: Self-pay | Admitting: Internal Medicine

## 2022-10-07 ENCOUNTER — Ambulatory Visit (INDEPENDENT_AMBULATORY_CARE_PROVIDER_SITE_OTHER): Payer: Medicare Other | Admitting: Internal Medicine

## 2022-10-07 VITALS — BP 130/84 | HR 70 | Temp 98.9°F | Ht 74.0 in | Wt 244.0 lb

## 2022-10-07 DIAGNOSIS — R002 Palpitations: Secondary | ICD-10-CM

## 2022-10-07 DIAGNOSIS — I471 Supraventricular tachycardia, unspecified: Secondary | ICD-10-CM | POA: Diagnosis not present

## 2022-10-07 DIAGNOSIS — I480 Paroxysmal atrial fibrillation: Secondary | ICD-10-CM

## 2022-10-07 MED ORDER — METOPROLOL SUCCINATE ER 25 MG PO TB24: 25.0000 mg | ORAL_TABLET | Freq: Every day | ORAL | 3 refills | Status: DC

## 2022-10-07 MED ORDER — METOPROLOL TARTRATE 50 MG PO TABS: 50.0000 mg | ORAL_TABLET | Freq: Two times a day (BID) | ORAL | 1 refills | Status: DC | PRN

## 2022-10-07 NOTE — Assessment & Plan Note (Signed)
Pt was ref to see Dr Stanford Breed  Cont w/Toprol XL 25 mg bid (unable to tolerate higher dose) Will use Metoprolol 50 mg bid prn ASA 325 mg if palpitations

## 2022-10-07 NOTE — Assessment & Plan Note (Signed)
Cont w/Toprol XL 25 mg bid (unable to tolerate higher dose) Will use Metoprolol 50 mg bid prn ASA 325 mg if palpitations Appt w/Dr Stanford Breed

## 2022-10-07 NOTE — Assessment & Plan Note (Signed)
No recent relapse

## 2022-10-07 NOTE — Progress Notes (Signed)
Subjective:  Patient ID: John Boone, male    DOB: 1951/04/28  Age: 72 y.o. MRN: [id number]  CC: Follow-up (2 WEEK FOLLOW-UP)   HPI [name] presents for palpitations on 09/22/22. No relapse  On Toprol XL 25 mg bid (unable to tolerate higher dose)   Per Sarah's note:     Reports that yesterday afternoon around 3:30 PM he started experiencing cardiac palpitations and felt that his heart was racing.  This lasted for approximately 17 hours before it subsided spontaneously. Has history of intermittent palpitations, thought to be related to paroxysmal atrial fibrillation however this has not been verified via cardiac monitoring. Has seen Dr. Stanford Breed with cardiology in the past who recommended continued use of metoprolol at '50mg'$  twice a day vs. Referral for possible ablation. Patient last saw Dr. Stanford Breed 10/2019 and has been stable on metoprolol from a symptomatic standpoint since then. However, patient reports that he has been taking metoprolol '25mg'$  by mouth twice a day as opposed to the recommended '50mg'$ . Only possible trigger prior to this appointment was recent respiratory infection that he is improving from. He did report associated chest tightness, but no pain.  " Outpatient Medications Prior to Visit  Medication Sig Dispense Refill   albuterol (PROVENTIL HFA) 108 (90 Base) MCG/ACT inhaler Inhale 2 puffs into the lungs 4 (four) times daily as needed for shortness of breath. 1 each 5   aspirin EC 81 MG tablet Take 1 tablet (81 mg total) by mouth daily. 90 tablet 3   Cholecalciferol (VITAMIN D3) 50 MCG (2000 UT) capsule Take 1 capsule (2,000 Units total) by mouth daily. 100 capsule 3   doxycycline (VIBRA-TABS) 100 MG tablet Take 1 tablet (100 mg total) by mouth 2 (two) times daily. 180 tablet 3   Omega-3 Fatty Acids (FISH OIL TRIPLE STRENGTH) 1400 MG CAPS Take 1 capsule by mouth daily.     metoprolol succinate (TOPROL-XL) 50 MG 24 hr tablet Take 1 tablet (50 mg total) by mouth 2 (two)  times daily. 180 tablet 3   No facility-administered medications prior to visit.    ROS: Review of Systems  Constitutional:  Negative for appetite change, fatigue and unexpected weight change.  HENT:  Negative for congestion, nosebleeds, sneezing, sore throat and trouble swallowing.   Eyes:  Negative for itching and visual disturbance.  Respiratory:  Negative for cough and shortness of breath.   Cardiovascular:  Positive for palpitations. Negative for chest pain and leg swelling.  Gastrointestinal:  Negative for abdominal distention, blood in stool, diarrhea and nausea.  Genitourinary:  Negative for frequency and hematuria.  Musculoskeletal:  Negative for back pain, gait problem, joint swelling and neck pain.  Skin:  Negative for rash.  Neurological:  Negative for dizziness, tremors, speech difficulty and weakness.  Psychiatric/Behavioral:  Negative for agitation, dysphoric mood and sleep disturbance. The patient is not nervous/anxious.     Objective:  BP 130/84 (BP Location: Right Arm, Patient Position: Sitting, Cuff Size: Normal)   Pulse 70   Temp 98.9 F (37.2 C) (Oral)   Ht '6\' 2"'$  (1.88 m)   Wt 244 lb (110.7 kg)   SpO2 97%   BMI 31.33 kg/m   BP Readings from Last 3 Encounters:  10/07/22 130/84  09/23/22 134/88  08/31/22 126/74    Wt Readings from Last 3 Encounters:  10/07/22 244 lb (110.7 kg)  09/23/22 241 lb 6 oz (109.5 kg)  08/31/22 245 lb (111.1 kg)    Physical Exam Constitutional:  General: He is not in acute distress.    Appearance: He is well-developed.     Comments: NAD  Eyes:     Conjunctiva/sclera: Conjunctivae normal.     Pupils: Pupils are equal, round, and reactive to light.  Neck:     Thyroid: No thyromegaly.     Vascular: No JVD.  Cardiovascular:     Rate and Rhythm: Normal rate and regular rhythm.     Heart sounds: Normal heart sounds. No murmur heard.    No friction rub. No gallop.  Pulmonary:     Effort: Pulmonary effort is normal. No  respiratory distress.     Breath sounds: Normal breath sounds. No wheezing or rales.  Chest:     Chest wall: No tenderness.  Abdominal:     General: Bowel sounds are normal. There is no distension.     Palpations: Abdomen is soft. There is no mass.     Tenderness: There is no abdominal tenderness. There is no guarding or rebound.  Musculoskeletal:        General: No tenderness. Normal range of motion.     Cervical back: Normal range of motion.  Lymphadenopathy:     Cervical: No cervical adenopathy.  Skin:    General: Skin is warm and dry.     Findings: No rash.  Neurological:     Mental Status: He is alert and oriented to person, place, and time.     Cranial Nerves: No cranial nerve deficit.     Motor: No abnormal muscle tone.     Coordination: Coordination normal.     Gait: Gait normal.     Deep Tendon Reflexes: Reflexes are normal and symmetric.  Psychiatric:        Behavior: Behavior normal.        Thought Content: Thought content normal.        Judgment: Judgment normal.     Lab Results  Component Value Date   WBC 6.4 06/02/2021   HGB 15.5 06/02/2021   HCT 47.0 06/02/2021   PLT 197.0 06/02/2021   GLUCOSE 95 09/23/2022   CHOL 218 (H) 06/02/2021   TRIG 156.0 (H) 06/02/2021   HDL 50.20 06/02/2021   LDLCALC 137 (H) 06/02/2021   ALT 17 06/02/2021   AST 16 06/02/2021   NA 141 09/23/2022   K 4.1 09/23/2022   CL 107 09/23/2022   CREATININE 1.01 09/23/2022   BUN 17 09/23/2022   CO2 27 09/23/2022   TSH 1.15 06/02/2021   PSA 0.94 06/02/2021    VAS US CAROTID  Result Date: 04/23/2020 Carotid Arterial Duplex Study Indications:       Bilateral bruits and patient c/o double vision but relates it                    to having cataracts. He denies any other cerebrovascular                    symptoms. Risk Factors:      Hypertension, hyperlipidemia, past history of smoking,                    coronary artery disease. Comparison Study:  NA Performing Technologist: Sharlett Iles RVT  Examination Guidelines: A complete evaluation includes B-mode imaging, spectral Doppler, color Doppler, and power Doppler as needed of all accessible portions of each vessel. Bilateral testing is considered an integral part of a complete examination. Limited examinations for reoccurring indications may be performed as  noted.  Right Carotid Findings: +----------+--------+--------+--------+------------------+--------+           PSV cm/sEDV cm/sStenosisPlaque DescriptionComments +----------+--------+--------+--------+------------------+--------+ CCA Prox  91      17                                         +----------+--------+--------+--------+------------------+--------+ CCA Distal79      23                                         +----------+--------+--------+--------+------------------+--------+ ICA Prox  58      16                                         +----------+--------+--------+--------+------------------+--------+ ICA Mid   62      27      Normal                             +----------+--------+--------+--------+------------------+--------+ ICA Distal71      28                                         +----------+--------+--------+--------+------------------+--------+ ECA       96      24                                         +----------+--------+--------+--------+------------------+--------+ +----------+--------+-------+----------------+-------------------+           PSV cm/sEDV cmsDescribe        Arm Pressure (mmHG) +----------+--------+-------+----------------+-------------------+ Subclavian180            Mildly turbulent151                 +----------+--------+-------+----------------+-------------------+ +---------+--------+--+--------+-+ VertebralPSV cm/s25EDV cm/s6 +---------+--------+--+--------+-+  Left Carotid Findings: +----------+--------+--------+--------+------------------+--------+           PSV cm/sEDV  cm/sStenosisPlaque DescriptionComments +----------+--------+--------+--------+------------------+--------+ CCA Prox  100     28                                         +----------+--------+--------+--------+------------------+--------+ CCA Distal73      19                                         +----------+--------+--------+--------+------------------+--------+ ICA Prox  58      21                                         +----------+--------+--------+--------+------------------+--------+ ICA Mid   71      29      Normal                             +----------+--------+--------+--------+------------------+--------+  ICA Distal81      34                                         +----------+--------+--------+--------+------------------+--------+ ECA       99      20                                         +----------+--------+--------+--------+------------------+--------+ +----------+--------+--------+----------------+-------------------+           PSV cm/sEDV cm/sDescribe        Arm Pressure (mmHG) +----------+--------+--------+----------------+-------------------+ Subclavian101             Multiphasic, [id number]                 +----------+--------+--------+----------------+-------------------+ +---------+--------+--+--------+--+---------+ VertebralPSV cm/s63EDV cm/s21Antegrade +---------+--------+--+--------+--+---------+   Summary: Right Carotid: There was no evidence of thrombus, dissection, atherosclerotic                plaque or stenosis in the cervical carotid system. Left Carotid: There was no evidence of thrombus, dissection, atherosclerotic               plaque or stenosis in the cervical carotid system. Vertebrals:  Bilateral vertebral arteries demonstrate antegrade flow. Subclavians: Right subclavian artery flow was mildly disturbed. Normal flow              hemodynamics were seen in the left subclavian artery. *See table(s) above for  measurements and observations.  Electronically signed by Ena Dawley MD on 04/23/2020 at 4:08:37 PM.    Final     Assessment & Plan:   Problem List Items Addressed This Visit       Cardiovascular and Mediastinum   SVT (supraventricular tachycardia) ([place]) - Primary    Cont w/Toprol XL 25 mg bid (unable to tolerate higher dose) Will use Metoprolol 50 mg bid prn ASA 325 mg if palpitations Appt w/Dr Stanford Breed       Relevant Medications   metoprolol succinate (TOPROL-XL) 25 MG 24 hr tablet   metoprolol tartrate (LOPRESSOR) 50 MG tablet   Atrial fibrillation ([facility])    Pt was ref to see Dr Stanford Breed  Cont w/Toprol XL 25 mg bid (unable to tolerate higher dose) Will use Metoprolol 50 mg bid prn ASA 325 mg if palpitations      Relevant Medications   metoprolol succinate (TOPROL-XL) 25 MG 24 hr tablet   metoprolol tartrate (LOPRESSOR) 50 MG tablet     Other   Palpitations    No recent relapse         Meds ordered this encounter  Medications   metoprolol succinate (TOPROL-XL) 25 MG 24 hr tablet    Sig: Take 1 tablet (25 mg total) by mouth daily.    Dispense:  180 tablet    Refill:  3   metoprolol tartrate (LOPRESSOR) 50 MG tablet    Sig: Take 1 tablet (50 mg total) by mouth 2 (two) times daily as needed.    Dispense:  60 tablet    Refill:  1      Follow-up: Return in about 3 months (around 01/05/2023) for a follow-up visit.  Walker Kehr, MD

## 2022-10-22 ENCOUNTER — Telehealth: Payer: Self-pay

## 2022-10-22 NOTE — Patient Outreach (Signed)
  Care Coordination   10/22/2022 Name: John Boone MRN: [id number] DOB: Jan 05, 1951   Care Coordination Outreach Attempts:  An unsuccessful telephone outreach was attempted today to offer the patient information about available care coordination services as a benefit of their health plan.   Follow Up Plan:  Additional outreach attempts will be made to offer the patient care coordination information and services.   Encounter Outcome:  No Answer   Care Coordination Interventions:  No, not indicated    Thea Silversmith, RN, MSN, BSN, [place] Coordinator [phone]

## 2022-10-30 ENCOUNTER — Other Ambulatory Visit: Payer: Self-pay | Admitting: Internal Medicine

## 2022-11-16 ENCOUNTER — Telehealth: Payer: Self-pay

## 2022-11-16 NOTE — Patient Outreach (Signed)
  Care Coordination   11/16/2022 Name: John Boone MRN: [id number] DOB: 1951-03-08   Care Coordination Outreach Attempts:  A second unsuccessful outreach was attempted today to offer the patient with information about available care coordination services as a benefit of their health plan.     Follow Up Plan:  Additional outreach attempts will be made to offer the patient care coordination information and services.   Encounter Outcome:  No Answer   Care Coordination Interventions:  No, not indicated    Thea Silversmith, RN, MSN, BSN, [place] Coordinator [phone]

## 2022-12-03 ENCOUNTER — Other Ambulatory Visit: Payer: Self-pay | Admitting: Internal Medicine

## 2022-12-06 ENCOUNTER — Telehealth: Payer: Self-pay

## 2022-12-06 NOTE — Patient Outreach (Signed)
  Care Coordination   12/06/2022 Name: John Boone MRN: [id number] DOB: [birth year]   Care Coordination Outreach Attempts:  A third unsuccessful outreach was attempted today to offer the patient with information about available care coordination services as a benefit of their health plan.   Follow Up Plan:  No further outreach attempts will be made at this time. We have been unable to contact the patient to offer or enroll patient in care coordination services  Encounter Outcome:  No Answer   Care Coordination Interventions:  No, not indicated    Kathyrn Sheriff, RN, MSN, BSN, CCM [facility] Care Coordinator [phone]

## 2023-01-02 ENCOUNTER — Other Ambulatory Visit: Payer: Self-pay | Admitting: Internal Medicine

## 2023-01-05 ENCOUNTER — Encounter: Payer: Self-pay | Admitting: Internal Medicine

## 2023-01-05 ENCOUNTER — Ambulatory Visit (INDEPENDENT_AMBULATORY_CARE_PROVIDER_SITE_OTHER): Payer: Medicare Other | Admitting: Internal Medicine

## 2023-01-05 VITALS — BP 134/92 | HR 90 | Temp 97.9°F | Ht 74.0 in | Wt 240.0 lb

## 2023-01-05 DIAGNOSIS — L03032 Cellulitis of left toe: Secondary | ICD-10-CM | POA: Diagnosis not present

## 2023-01-05 DIAGNOSIS — J320 Chronic maxillary sinusitis: Secondary | ICD-10-CM | POA: Diagnosis not present

## 2023-01-05 DIAGNOSIS — R002 Palpitations: Secondary | ICD-10-CM

## 2023-01-05 MED ORDER — METOPROLOL SUCCINATE ER 25 MG PO TB24: 25.0000 mg | ORAL_TABLET | Freq: Two times a day (BID) | ORAL | 3 refills | Status: DC

## 2023-01-05 NOTE — Assessment & Plan Note (Signed)
Cont on Toprol XL po 25 mg bid

## 2023-01-05 NOTE — Assessment & Plan Note (Signed)
Healing Use soaks

## 2023-01-05 NOTE — Progress Notes (Signed)
Subjective:  Patient ID: John Boone, male    DOB: [birth year]  Age: 72 y.o. MRN: [id number]  CC: No chief complaint on file.   HPI GIVON DONAHOO presents for L middle toe pain, palpitations - SVT, sinusitis  Outpatient Medications Prior to Visit  Medication Sig Dispense Refill   albuterol (PROVENTIL HFA) 108 (90 Base) MCG/ACT inhaler Inhale 2 puffs into the lungs 4 (four) times daily as needed for shortness of breath. 1 each 5   aspirin EC 81 MG tablet Take 1 tablet (81 mg total) by mouth daily. 90 tablet 3   Cholecalciferol (VITAMIN D3) 50 MCG (2000 UT) capsule Take 1 capsule (2,000 Units total) by mouth daily. 100 capsule 3   doxycycline (VIBRA-TABS) 100 MG tablet TAKE 1 TABLET BY MOUTH TWICE A DAY 180 tablet 3   Omega-3 Fatty Acids (FISH OIL TRIPLE STRENGTH) 1400 MG CAPS Take 1 capsule by mouth daily.     metoprolol succinate (TOPROL-XL) 25 MG 24 hr tablet Take 1 tablet (25 mg total) by mouth daily. 180 tablet 3   metoprolol tartrate (LOPRESSOR) 50 MG tablet TAKE 1 TABLET BY MOUTH 2 TIMES DAILY AS NEEDED. 180 tablet 1   No facility-administered medications prior to visit.    ROS: Review of Systems  Constitutional:  Negative for appetite change, chills, fatigue and unexpected weight change.  HENT:  Positive for congestion, postnasal drip, rhinorrhea and sinus pressure. Negative for nosebleeds, sneezing, sore throat and trouble swallowing.   Eyes:  Negative for itching and visual disturbance.  Respiratory:  Negative for cough.   Cardiovascular:  Negative for chest pain, palpitations and leg swelling.  Gastrointestinal:  Negative for abdominal distention, blood in stool, diarrhea and nausea.  Genitourinary:  Negative for frequency and hematuria.  Musculoskeletal:  Negative for back pain, gait problem, joint swelling and neck pain.  Skin:  Positive for color change. Negative for rash.  Neurological:  Negative for dizziness, tremors, speech difficulty and weakness.   Psychiatric/Behavioral:  Positive for sleep disturbance. Negative for agitation and dysphoric mood. The patient is not nervous/anxious.     Objective:  BP (!) 134/92 (BP Location: Left Arm, Patient Position: Sitting, Cuff Size: Normal)   Pulse 90   Temp 97.9 F (36.6 C) (Oral)   Ht 6\' 2"  (1.88 m)   Wt 240 lb (108.9 kg)   SpO2 96%   BMI 30.81 kg/m   BP Readings from Last 3 Encounters:  01/05/23 (!) 134/92  10/07/22 130/84  09/23/22 134/88    Wt Readings from Last 3 Encounters:  01/05/23 240 lb (108.9 kg)  10/07/22 244 lb (110.7 kg)  09/23/22 241 lb 6 oz (109.5 kg)    Physical Exam Constitutional:      General: He is not in acute distress.    Appearance: He is well-developed.     Comments: NAD  HENT:     Nose: Congestion and rhinorrhea present.  Eyes:     Conjunctiva/sclera: Conjunctivae normal.     Pupils: Pupils are equal, round, and reactive to light.  Neck:     Thyroid: No thyromegaly.     Vascular: No JVD.  Cardiovascular:     Rate and Rhythm: Normal rate and regular rhythm.     Heart sounds: Normal heart sounds.     No friction rub.  Pulmonary:     Effort: Pulmonary effort is normal.     Breath sounds: Normal breath sounds.  Chest:     Chest wall: No tenderness.  Abdominal:  General: Bowel sounds are normal. There is no distension.     Palpations: Abdomen is soft. There is mass.     Tenderness: There is abdominal tenderness. There is guarding and rebound.  Musculoskeletal:        General: Normal range of motion.     Cervical back: Normal range of motion.  Lymphadenopathy:     Cervical: No cervical adenopathy.  Skin:    General: Skin is warm and dry.     Findings: No rash.  Neurological:     Mental Status: He is alert and oriented to person, place, and time.     Cranial Nerves: No cranial nerve deficit.     Motor: No abnormal muscle tone.     Coordination: Coordination normal.     Gait: Gait normal.     Deep Tendon Reflexes: Reflexes are  normal and symmetric.  Psychiatric:        Behavior: Behavior normal.        Thought Content: Thought content normal.        Judgment: Judgment normal.   Swollen nasal mucosa Respolving paronychia, nail is getting loose  Lab Results  Component Value Date   WBC 6.4 06/02/2021   HGB 15.5 06/02/2021   HCT 47.0 06/02/2021   PLT 197.0 06/02/2021   GLUCOSE 95 09/23/2022   CHOL 218 (H) 06/02/2021   TRIG 156.0 (H) 06/02/2021   HDL 50.20 06/02/2021   LDLCALC 137 (H) 06/02/2021   ALT 17 06/02/2021   AST 16 06/02/2021   NA 141 09/23/2022   K 4.1 09/23/2022   CL 107 09/23/2022   CREATININE 1.01 09/23/2022   BUN 17 09/23/2022   CO2 27 09/23/2022   TSH 1.15 06/02/2021   PSA 0.94 06/02/2021    VAS US CAROTID  Result Date: 04/23/2020 Carotid Arterial Duplex Study Indications:       Bilateral bruits and patient c/o double vision but relates it                    to having cataracts. He denies any other cerebrovascular                    symptoms. Risk Factors:      Hypertension, hyperlipidemia, past history of smoking,                    coronary artery disease. Comparison Study:  NA Performing Technologist: Tyna Jaksch RVT  Examination Guidelines: A complete evaluation includes B-mode imaging, spectral Doppler, color Doppler, and power Doppler as needed of all accessible portions of each vessel. Bilateral testing is considered an integral part of a complete examination. Limited examinations for reoccurring indications may be performed as noted.  Right Carotid Findings: +----------+--------+--------+--------+------------------+--------+           PSV cm/sEDV cm/sStenosisPlaque DescriptionComments +----------+--------+--------+--------+------------------+--------+ CCA Prox  91      17                                         +----------+--------+--------+--------+------------------+--------+ CCA Distal79      23                                          +----------+--------+--------+--------+------------------+--------+ ICA Prox  58      16                                         +----------+--------+--------+--------+------------------+--------+  ICA Mid   62      27      Normal                             +----------+--------+--------+--------+------------------+--------+ ICA Distal71      28                                         +----------+--------+--------+--------+------------------+--------+ ECA       96      24                                         +----------+--------+--------+--------+------------------+--------+ +----------+--------+-------+----------------+-------------------+           PSV cm/sEDV cmsDescribe        Arm Pressure (mmHG) +----------+--------+-------+----------------+-------------------+ Subclavian180            Mildly turbulent151                 +----------+--------+-------+----------------+-------------------+ +---------+--------+--+--------+-+ VertebralPSV cm/s25EDV cm/s6 +---------+--------+--+--------+-+  Left Carotid Findings: +----------+--------+--------+--------+------------------+--------+           PSV cm/sEDV cm/sStenosisPlaque DescriptionComments +----------+--------+--------+--------+------------------+--------+ CCA Prox  100     28                                         +----------+--------+--------+--------+------------------+--------+ CCA Distal73      19                                         +----------+--------+--------+--------+------------------+--------+ ICA Prox  58      21                                         +----------+--------+--------+--------+------------------+--------+ ICA Mid   71      29      Normal                             +----------+--------+--------+--------+------------------+--------+ ICA Distal81      34                                          +----------+--------+--------+--------+------------------+--------+ ECA       99      20                                         +----------+--------+--------+--------+------------------+--------+ +----------+--------+--------+----------------+-------------------+           PSV cm/sEDV cm/sDescribe        Arm Pressure (mmHG) +----------+--------+--------+----------------+-------------------+ Subclavian101             Multiphasic, [id number]                 +----------+--------+--------+----------------+-------------------+ +---------+--------+--+--------+--+---------+ VertebralPSV cm/s63EDV cm/s21Antegrade +---------+--------+--+--------+--+---------+   Summary: Right Carotid: There  was no evidence of thrombus, dissection, atherosclerotic                plaque or stenosis in the cervical carotid system. Left Carotid: There was no evidence of thrombus, dissection, atherosclerotic               plaque or stenosis in the cervical carotid system. Vertebrals:  Bilateral vertebral arteries demonstrate antegrade flow. Subclavians: Right subclavian artery flow was mildly disturbed. Normal flow              hemodynamics were seen in the left subclavian artery. *See table(s) above for measurements and observations.  Electronically signed by Tobias Alexander MD on 04/23/2020 at 4:08:37 PM.    Final     Assessment & Plan:   Problem List Items Addressed This Visit     Palpitations    Cont on Toprol XL po 25 mg bid      Sinusitis, chronic - Primary    Try Benadryl at hs [facility]      Paronychia, toe, left    Healing Use soaks         Meds ordered this encounter  Medications   metoprolol succinate (TOPROL-XL) 25 MG 24 hr tablet    Sig: Take 1 tablet (25 mg total) by mouth 2 (two) times daily.    Dispense:  180 tablet    Refill:  3      Follow-up: Return in about 6 months (around 07/08/2023) for a follow-up visit.  Sonda Primes, MD

## 2023-01-05 NOTE — Assessment & Plan Note (Signed)
Try Benadryl at hs [facility]

## 2023-01-05 NOTE — Patient Instructions (Signed)
Try Benadryl 12.5, 25 or 50 mg at night

## 2023-01-31 ENCOUNTER — Encounter (INDEPENDENT_AMBULATORY_CARE_PROVIDER_SITE_OTHER): Payer: Medicare Other | Admitting: Ophthalmology

## 2023-07-07 ENCOUNTER — Encounter: Payer: Self-pay | Admitting: Internal Medicine

## 2023-07-07 ENCOUNTER — Ambulatory Visit: Payer: Medicare Other | Admitting: Internal Medicine

## 2023-07-07 VITALS — BP 140/90 | HR 71 | Temp 98.2°F | Ht 74.0 in | Wt 240.0 lb

## 2023-07-07 DIAGNOSIS — R002 Palpitations: Secondary | ICD-10-CM

## 2023-07-07 DIAGNOSIS — I480 Paroxysmal atrial fibrillation: Secondary | ICD-10-CM

## 2023-07-07 DIAGNOSIS — I10 Essential (primary) hypertension: Secondary | ICD-10-CM | POA: Diagnosis not present

## 2023-07-07 DIAGNOSIS — N320 Bladder-neck obstruction: Secondary | ICD-10-CM | POA: Diagnosis not present

## 2023-07-07 DIAGNOSIS — E785 Hyperlipidemia, unspecified: Secondary | ICD-10-CM | POA: Diagnosis not present

## 2023-07-07 LAB — CBC WITH DIFFERENTIAL/PLATELET
Basophils Absolute: 0 10*3/uL (ref 0.0–0.1)
Basophils Relative: 0.4 % (ref 0.0–3.0)
Eosinophils Absolute: 0.1 10*3/uL (ref 0.0–0.7)
Eosinophils Relative: 1.8 % (ref 0.0–5.0)
HCT: 45.3 % (ref 39.0–52.0)
Hemoglobin: 15 g/dL (ref 13.0–17.0)
Lymphocytes Relative: 19.2 % (ref 12.0–46.0)
Lymphs Abs: 1.4 10*3/uL (ref 0.7–4.0)
MCHC: 33 g/dL (ref 30.0–36.0)
MCV: 90.1 fL (ref 78.0–100.0)
Monocytes Absolute: 0.7 10*3/uL (ref 0.1–1.0)
Monocytes Relative: 9.1 % (ref 3.0–12.0)
Neutro Abs: 5.1 10*3/uL (ref 1.4–7.7)
Neutrophils Relative %: 69.5 % (ref 43.0–77.0)
Platelets: 222 10*3/uL (ref 150.0–400.0)
RBC: 5.03 Mil/uL (ref 4.22–5.81)
RDW: 13.5 % (ref 11.5–15.5)
WBC: 7.3 10*3/uL (ref 4.0–10.5)

## 2023-07-07 LAB — LIPID PANEL
Cholesterol: 173 mg/dL (ref 0–200)
HDL: 35.5 mg/dL — ABNORMAL LOW (ref >39.00)
LDL Cholesterol: 118 mg/dL — ABNORMAL HIGH (ref 0–99)
NonHDL: 137.21
Total CHOL/HDL Ratio: 5
Triglycerides: 97 mg/dL (ref 0.0–149.0)
VLDL: 19.4 mg/dL (ref 0.0–40.0)

## 2023-07-07 LAB — URINALYSIS
Bilirubin Urine: NEGATIVE
Hgb urine dipstick: NEGATIVE
Ketones, ur: NEGATIVE
Leukocytes,Ua: NEGATIVE
Nitrite: NEGATIVE
Specific Gravity, Urine: 1.025 (ref 1.000–1.030)
Total Protein, Urine: NEGATIVE
Urine Glucose: NEGATIVE
Urobilinogen, UA: 0.2 (ref 0.0–1.0)
pH: 5.5 (ref 5.0–8.0)

## 2023-07-07 LAB — COMPREHENSIVE METABOLIC PANEL
ALT: 16 U/L (ref 0–53)
AST: 15 U/L (ref 0–37)
Albumin: 4.3 g/dL (ref 3.5–5.2)
Alkaline Phosphatase: 75 U/L (ref 39–117)
BUN: 18 mg/dL (ref 6–23)
CO2: 27 meq/L (ref 19–32)
Calcium: 9.9 mg/dL (ref 8.4–10.5)
Chloride: 106 meq/L (ref 96–112)
Creatinine, Ser: 0.98 mg/dL (ref 0.40–1.50)
GFR: 77.77 mL/min (ref >60.00)
Glucose, Bld: 96 mg/dL (ref 70–99)
Potassium: 4.7 meq/L (ref 3.5–5.1)
Sodium: 140 meq/L (ref 135–145)
Total Bilirubin: 0.6 mg/dL (ref 0.2–1.2)
Total Protein: 6.7 g/dL (ref 6.0–8.3)

## 2023-07-07 LAB — PSA: PSA: 1.12 ng/mL (ref 0.10–4.00)

## 2023-07-07 LAB — TSH: TSH: 0.75 u[IU]/mL (ref 0.35–5.50)

## 2023-07-07 NOTE — Assessment & Plan Note (Signed)
Cont on Toprol XL po 25 mg bid

## 2023-07-07 NOTE — Assessment & Plan Note (Signed)
SBP at home 130's-140 On Toprol XL

## 2023-07-07 NOTE — Assessment & Plan Note (Signed)
Rare episodes <5 min Cont w/Toprol XL 25 mg bid (unable to tolerate higher dose) Will use Metoprolol 50 mg bid prn ASA 325 mg if palpitations

## 2023-07-07 NOTE — Progress Notes (Signed)
Subjective:  Patient ID: John Boone, male    DOB: Jun 02, 1951  Age: 72 y.o. MRN: [id number]  CC: Medical Management of Chronic Issues (6 mnth f/u)   HPI John Boone presents for HTN, asthma, A fib BP nl at home  Outpatient Medications Prior to Visit  Medication Sig Dispense Refill   albuterol (PROVENTIL HFA) 108 (90 Base) MCG/ACT inhaler Inhale 2 puffs into the lungs 4 (four) times daily as needed for shortness of breath. 1 each 5   aspirin EC 81 MG tablet Take 1 tablet (81 mg total) by mouth daily. 90 tablet 3   Cholecalciferol (VITAMIN D3) 50 MCG (2000 UT) capsule Take 1 capsule (2,000 Units total) by mouth daily. 100 capsule 3   metoprolol succinate (TOPROL-XL) 25 MG 24 hr tablet Take 1 tablet (25 mg total) by mouth 2 (two) times daily. 180 tablet 3   Omega-3 Fatty Acids (FISH OIL TRIPLE STRENGTH) 1400 MG CAPS Take 1 capsule by mouth daily.     doxycycline (VIBRA-TABS) 100 MG tablet TAKE 1 TABLET BY MOUTH TWICE A DAY 180 tablet 3   No facility-administered medications prior to visit.    ROS: Review of Systems  Constitutional:  Negative for appetite change, fatigue and unexpected weight change.  HENT:  Negative for congestion, nosebleeds, sneezing, sore throat and trouble swallowing.   Eyes:  Negative for itching and visual disturbance.  Respiratory:  Negative for cough.   Cardiovascular:  Positive for palpitations. Negative for chest pain and leg swelling.  Gastrointestinal:  Negative for abdominal distention, blood in stool, diarrhea and nausea.  Genitourinary:  Negative for frequency and hematuria.  Musculoskeletal:  Negative for back pain, gait problem, joint swelling and neck pain.  Skin:  Positive for color change and rash.  Neurological:  Negative for dizziness, tremors, speech difficulty and weakness.  Psychiatric/Behavioral:  Negative for agitation, dysphoric mood and sleep disturbance. The patient is not nervous/anxious.     Objective:  BP (!) 140/90 (BP  Location: Left Arm, Patient Position: Sitting, Cuff Size: Normal)   Pulse 71   Temp 98.2 F (36.8 C) (Oral)   Ht 6\' 2"  (1.88 m)   Wt 240 lb (108.9 kg)   SpO2 94%   BMI 30.81 kg/m   BP Readings from Last 3 Encounters:  07/07/23 (!) 140/90  01/05/23 (!) 134/92  10/07/22 130/84    Wt Readings from Last 3 Encounters:  07/07/23 240 lb (108.9 kg)  01/05/23 240 lb (108.9 kg)  10/07/22 244 lb (110.7 kg)    Physical Exam Constitutional:      General: He is not in acute distress.    Appearance: He is well-developed. He is obese.     Comments: NAD  Eyes:     Conjunctiva/sclera: Conjunctivae normal.     Pupils: Pupils are equal, round, and reactive to light.  Neck:     Thyroid: No thyromegaly.     Vascular: No JVD.  Cardiovascular:     Rate and Rhythm: Normal rate and regular rhythm.     Heart sounds: Normal heart sounds. No murmur heard.    No friction rub. No gallop.  Pulmonary:     Effort: Pulmonary effort is normal. No respiratory distress.     Breath sounds: Normal breath sounds. No wheezing or rales.  Chest:     Chest wall: No tenderness.  Abdominal:     General: Bowel sounds are normal. There is no distension.     Palpations: Abdomen is soft. There is  no mass.     Tenderness: There is no abdominal tenderness. There is no guarding or rebound.  Musculoskeletal:        General: No tenderness. Normal range of motion.     Cervical back: Normal range of motion.  Lymphadenopathy:     Cervical: No cervical adenopathy.  Skin:    General: Skin is warm and dry.     Findings: Erythema present. No rash.  Neurological:     Mental Status: He is alert and oriented to person, place, and time.     Cranial Nerves: No cranial nerve deficit.     Motor: No abnormal muscle tone.     Coordination: Coordination normal.     Gait: Gait normal.     Deep Tendon Reflexes: Reflexes are normal and symmetric.  Psychiatric:        Behavior: Behavior normal.        Thought Content: Thought  content normal.        Judgment: Judgment normal.     Lab Results  Component Value Date   WBC 6.4 06/02/2021   HGB 15.5 06/02/2021   HCT 47.0 06/02/2021   PLT 197.0 06/02/2021   GLUCOSE 95 09/23/2022   CHOL 218 (H) 06/02/2021   TRIG 156.0 (H) 06/02/2021   HDL 50.20 06/02/2021   LDLCALC 137 (H) 06/02/2021   ALT 17 06/02/2021   AST 16 06/02/2021   NA 141 09/23/2022   K 4.1 09/23/2022   CL 107 09/23/2022   CREATININE 1.01 09/23/2022   BUN 17 09/23/2022   CO2 27 09/23/2022   TSH 1.15 06/02/2021   PSA 0.94 06/02/2021    VAS US CAROTID  Result Date: 04/23/2020 Carotid Arterial Duplex Study Indications:       Bilateral bruits and patient c/o double vision but relates it                    to having cataracts. He denies any other cerebrovascular                    symptoms. Risk Factors:      Hypertension, hyperlipidemia, past history of smoking,                    coronary artery disease. Comparison Study:  NA Performing Technologist: Tyna Jaksch RVT  Examination Guidelines: A complete evaluation includes B-mode imaging, spectral Doppler, color Doppler, and power Doppler as needed of all accessible portions of each vessel. Bilateral testing is considered an integral part of a complete examination. Limited examinations for reoccurring indications may be performed as noted.  Right Carotid Findings: +----------+--------+--------+--------+------------------+--------+           PSV cm/sEDV cm/sStenosisPlaque DescriptionComments +----------+--------+--------+--------+------------------+--------+ CCA Prox  91      17                                         +----------+--------+--------+--------+------------------+--------+ CCA Distal79      23                                         +----------+--------+--------+--------+------------------+--------+ ICA Prox  58      16                                          +----------+--------+--------+--------+------------------+--------+  ICA Mid   62      27      Normal                             +----------+--------+--------+--------+------------------+--------+ ICA Distal71      28                                         +----------+--------+--------+--------+------------------+--------+ ECA       96      24                                         +----------+--------+--------+--------+------------------+--------+ +----------+--------+-------+----------------+-------------------+           PSV cm/sEDV cmsDescribe        Arm Pressure (mmHG) +----------+--------+-------+----------------+-------------------+ Subclavian180            Mildly turbulent151                 +----------+--------+-------+----------------+-------------------+ +---------+--------+--+--------+-+ VertebralPSV cm/s25EDV cm/s6 +---------+--------+--+--------+-+  Left Carotid Findings: +----------+--------+--------+--------+------------------+--------+           PSV cm/sEDV cm/sStenosisPlaque DescriptionComments +----------+--------+--------+--------+------------------+--------+ CCA Prox  100     28                                         +----------+--------+--------+--------+------------------+--------+ CCA Distal73      19                                         +----------+--------+--------+--------+------------------+--------+ ICA Prox  58      21                                         +----------+--------+--------+--------+------------------+--------+ ICA Mid   71      29      Normal                             +----------+--------+--------+--------+------------------+--------+ ICA Distal81      34                                         +----------+--------+--------+--------+------------------+--------+ ECA       99      20                                          +----------+--------+--------+--------+------------------+--------+ +----------+--------+--------+----------------+-------------------+           PSV cm/sEDV cm/sDescribe        Arm Pressure (mmHG) +----------+--------+--------+----------------+-------------------+ Subclavian101             Multiphasic, [id number]                 +----------+--------+--------+----------------+-------------------+ +---------+--------+--+--------+--+---------+ VertebralPSV cm/s63EDV cm/s21Antegrade +---------+--------+--+--------+--+---------+   Summary: Right Carotid: There  was no evidence of thrombus, dissection, atherosclerotic                plaque or stenosis in the cervical carotid system. Left Carotid: There was no evidence of thrombus, dissection, atherosclerotic               plaque or stenosis in the cervical carotid system. Vertebrals:  Bilateral vertebral arteries demonstrate antegrade flow. Subclavians: Right subclavian artery flow was mildly disturbed. Normal flow              hemodynamics were seen in the left subclavian artery. *See table(s) above for measurements and observations.  Electronically signed by Tobias Alexander MD on 04/23/2020 at 4:08:37 PM.    Final     Assessment & Plan:   Problem List Items Addressed This Visit     Essential hypertension - Primary    SBP at home 130's-140 On Toprol XL      Relevant Orders   CBC with Differential/Platelet   Comprehensive metabolic panel   TSH   Urinalysis   Lipid panel   PSA   Atrial fibrillation (HCC)    Rare episodes <5 min Cont w/Toprol XL 25 mg bid (unable to tolerate higher dose) Will use Metoprolol 50 mg bid prn ASA 325 mg if palpitations      Relevant Orders   CBC with Differential/Platelet   Comprehensive metabolic panel   TSH   Urinalysis   Lipid panel   PSA   Palpitations    Cont on Toprol XL po 25 mg bid      Relevant Orders   TSH   Dyslipidemia    Statin intolerant      Relevant Orders   TSH   Lipid  panel   Other Visit Diagnoses     Bladder neck obstruction       Relevant Orders   PSA         No orders of the defined types were placed in this encounter.     Follow-up: Return in about 6 months (around 01/04/2024) for a follow-up visit.  Sonda Primes, MD

## 2023-07-07 NOTE — Assessment & Plan Note (Signed)
Statin intolerant 

## 2023-12-02 ENCOUNTER — Ambulatory Visit (INDEPENDENT_AMBULATORY_CARE_PROVIDER_SITE_OTHER): Admitting: Nurse Practitioner

## 2023-12-02 ENCOUNTER — Ambulatory Visit: Payer: Self-pay | Admitting: *Deleted

## 2023-12-02 ENCOUNTER — Ambulatory Visit: Attending: Nurse Practitioner

## 2023-12-02 VITALS — BP 182/106 | HR 81 | Temp 98.5°F | Ht 74.0 in | Wt 246.0 lb

## 2023-12-02 DIAGNOSIS — I10 Essential (primary) hypertension: Secondary | ICD-10-CM | POA: Diagnosis not present

## 2023-12-02 DIAGNOSIS — R002 Palpitations: Secondary | ICD-10-CM

## 2023-12-02 MED ORDER — HYDROCHLOROTHIAZIDE 12.5 MG PO TABS: 12.5000 mg | ORAL_TABLET | Freq: Every day | ORAL | 0 refills | Status: AC

## 2023-12-02 NOTE — Telephone Encounter (Signed)
  Chief Complaint: irregular heart beat  Symptoms: heart rate drops in the 50's at times lasting few seconds. Now HR 92 bpm. Irregular HR comes and goes. Denies other sx Frequency: yesterday  Pertinent Negatives: Patient denies chest pain no difficulty breathing no dizziness no sweating  Disposition: [] ED /[] Urgent Care (no appt availability in office) / [x] Appointment(In office/virtual)/ []  [facility]/ [] Home Care/ [] Refused Recommended Disposition /[] [facility]/ []  Follow-up with PCP Additional Notes:   No available appt with PCP. Scheduled appt with other provider today in office.        Copied from CRM [id number]. Topic: Clinical - Red Word Triage >> Dec 02, 2023  8:23 AM Sonny Dandy B wrote: [facility] that prompted transfer to Nurse Triage: Irregular heart beat, more frequent Reason for Disposition  Age > 60 years  (Exception: Brief heartbeat symptoms that went away and now feels well.)  Answer Assessment - Initial Assessment Questions 1. DESCRIPTION: "Please describe your heart rate or heartbeat that you are having" (e.g., fast/slow, regular/irregular, skipped or extra beats, "palpitations")     Irregular heart beat 2. ONSET: "When did it start?" (Minutes, hours or days)      Yesterday  3. DURATION: "How long does it last" (e.g., seconds, minutes, hours)     Slow heartbeat few seconds 4. PATTERN "Does it come and go, or has it been constant since it started?"  "Does it get worse with exertion?"   "Are you feeling it now?"     Comes and goes  5. TAP: "Using your hand, can you tap out what you are feeling on a chair or table in front of you, so that I can hear?" (Note: not all patients can do this)       na 6. HEART RATE: "Can you tell me your heart rate?" "How many beats in 15 seconds?"  (Note: not all patients can do this)       92 bpm now 7. RECURRENT SYMPTOM: "Have you ever had this before?" If Yes, ask: "When was the last time?" and "What happened that time?"       Yes  8. CAUSE: "What do you think is causing the palpitations?"     Not sure  9. CARDIAC HISTORY: "Do you have any history of heart disease?" (e.g., heart attack, angina, bypass surgery, angioplasty, arrhythmia)      Hx  10. OTHER SYMPTOMS: "Do you have any other symptoms?" (e.g., dizziness, chest pain, sweating, difficulty breathing)       No  11. PREGNANCY: "Is there any chance you are pregnant?" "When was your last menstrual period?"       na  Protocols used: Heart Rate and Heartbeat Questions-A-AH

## 2023-12-02 NOTE — Assessment & Plan Note (Signed)
 Chronic Blood pressure quite elevated upon rooming today, recheck identified further elevation with blood pressure of 182/106.  Patient reports that systolic blood pressure at home today on at home blood pressure cuff was 115.  He reports that he monitors his blood pressure regularly and it is never as high at home as it is currently in the office.  We discussed risks of untreated elevated blood pressure including heart attack and stroke.  I did recommend adding hydrochlorothiazide 12.5 mg daily to his regimen (he does not tolerate ARB's and has not tolerated verapamil in the past).  Currently taking metoprolol XL 25 mg twice daily.  He reports that he may start this but is not sure if he will.  He does report that he will monitor his blood pressure closely at home over the next few days.  He is asymptomatic other than cardiac palpitations. We discussed workup to include lab work as well as long-term cardiac monitor.  He is open to having long-term cardiac monitor ordered but is not sure if he wants to come back for labs as he plans on following up with PCP in May for routine labs anyway.  I have placed orders so that way if he changes his mind he can come to lab to have them drawn. We discussed ER precautions, and he reports his understanding. He is encouraged to follow-up with his PCP in 1 month as scheduled for close monitoring, or sooner as needed.

## 2023-12-02 NOTE — Progress Notes (Unsigned)
 EP to read.

## 2023-12-02 NOTE — Progress Notes (Signed)
 Established Patient Office Visit  Subjective   Patient ID: John Boone, male    DOB: 1950-11-08  Age: 73 y.o. MRN: [id number]  Chief Complaint  Patient presents with   Irregular Heart Beat    Beating slowly,since yesterday     Palpitations: Patient arrives today for acute visit.  He reports that he had cardiac palpitations that were prolonged for about 3 to 4 hours yesterday.  He reports frequent cardiac palpitations that come and go and only lasts for a moment before spontaneously resolving.  But yesterday was concerning to him because they were prolonged.  He describes it as almost a feeling of a slowed heartbeat with a pause.  Patient does have chronic right bundle branch block that he is aware of.  He denies history of known hypertrophic cardiac myopathy.  He does have a smart watch that is able to do EKG and reports that no evidence of atrial fibrillation was identified when he checked this at home.    Review of Systems  Respiratory:  Negative for shortness of breath.   Cardiovascular:  Negative for chest pain and palpitations.  Neurological:  Positive for headaches.      Objective:     BP (!) 182/106   Pulse 81   Temp 98.5 F (36.9 C) (Temporal)   Ht 6\' 2"  (1.88 m)   Wt 246 lb (111.6 kg)   SpO2 98%   BMI 31.58 kg/m  BP Readings from Last 3 Encounters:  12/02/23 (!) 182/106  07/07/23 (!) 140/90  01/05/23 (!) 134/92   Wt Readings from Last 3 Encounters:  12/02/23 246 lb (111.6 kg)  07/07/23 240 lb (108.9 kg)  01/05/23 240 lb (108.9 kg)      Physical Exam Vitals reviewed.  Constitutional:      Appearance: Normal appearance.  HENT:     Head: Normocephalic and atraumatic.  Cardiovascular:     Rate and Rhythm: Normal rate and regular rhythm.  Pulmonary:     Effort: Pulmonary effort is normal.     Breath sounds: Normal breath sounds.  Musculoskeletal:     Cervical back: Neck supple.  Skin:    General: Skin is warm and dry.  Neurological:     Mental  Status: He is alert and oriented to person, place, and time.  Psychiatric:        Mood and Affect: Mood normal.        Behavior: Behavior normal.        Thought Content: Thought content normal.        Judgment: Judgment normal.    EKG: Normal sinus rhythm with right bundle branch block  No results found for any visits on 12/02/23.    The 10-year ASCVD risk score (Arnett DK, et al., 2019) is: 40.2%    Assessment & Plan:   Problem List Items Addressed This Visit       Cardiovascular and Mediastinum   Essential hypertension   Chronic Blood pressure quite elevated upon rooming today, recheck identified further elevation with blood pressure of 182/106.  Patient reports that systolic blood pressure at home today on at home blood pressure cuff was 115.  He reports that he monitors his blood pressure regularly and it is never as high at home as it is currently in the office.  We discussed risks of untreated elevated blood pressure including heart attack and stroke.  I did recommend adding hydrochlorothiazide 12.5 mg daily to his regimen (he does not tolerate ARB's and has  not tolerated verapamil in the past).  Currently taking metoprolol XL 25 mg twice daily.  He reports that he may start this but is not sure if he will.  He does report that he will monitor his blood pressure closely at home over the next few days.  He is asymptomatic other than cardiac palpitations. We discussed workup to include lab work as well as long-term cardiac monitor.  He is open to having long-term cardiac monitor ordered but is not sure if he wants to come back for labs as he plans on following up with PCP in May for routine labs anyway.  I have placed orders so that way if he changes his mind he can come to lab to have them drawn. We discussed ER precautions, and he reports his understanding. He is encouraged to follow-up with his PCP in 1 month as scheduled for close monitoring, or sooner as needed.      Relevant  Medications   hydrochlorothiazide (HYDRODIURIL) 12.5 MG tablet   Other Relevant Orders   CBC   TSH   Comprehensive metabolic panel with GFR     Other   Palpitations - Primary   Chronic Blood pressure quite elevated upon rooming today, recheck identified further elevation with blood pressure of 182/106.  Patient reports that systolic blood pressure at home today on at home blood pressure cuff was 115.  He reports that he monitors his blood pressure regularly and it is never as high at home as it is currently in the office.  We discussed risks of untreated elevated blood pressure including heart attack and stroke.  I did recommend adding hydrochlorothiazide 12.5 mg daily to his regimen (he does not tolerate ARB's and has not tolerated verapamil in the past).  Currently taking metoprolol XL 25 mg twice daily.  He reports that he may start this but is not sure if he will.  He does report that he will monitor his blood pressure closely at home over the next few days.  He is asymptomatic other than cardiac palpitations. We discussed workup to include lab work as well as long-term cardiac monitor.  He is open to having long-term cardiac monitor ordered but is not sure if he wants to come back for labs as he plans on following up with PCP in May for routine labs anyway.  I have placed orders so that way if he changes his mind he can come to lab to have them drawn. We discussed ER precautions, and he reports his understanding. He is encouraged to follow-up with his PCP in 1 month as scheduled for close monitoring, or sooner as needed.      Relevant Orders   EKG 12-Lead   LONG TERM MONITOR (3-14 DAYS)   CBC   TSH   Comprehensive metabolic panel with GFR    Return in about 1 month (around 01/01/2024) for F/U with PCP in 1 month for BP check, come to lab in 1 week for labs.  In addition to collecting EKG, 34 minutes was spent on this encounter today including review of previous medical records,  face-to-face evaluation, and discussion/development of treatment plan.   Elenore Paddy, NP

## 2023-12-12 ENCOUNTER — Other Ambulatory Visit (INDEPENDENT_AMBULATORY_CARE_PROVIDER_SITE_OTHER)

## 2023-12-12 DIAGNOSIS — I10 Essential (primary) hypertension: Secondary | ICD-10-CM

## 2023-12-12 DIAGNOSIS — R002 Palpitations: Secondary | ICD-10-CM

## 2023-12-12 LAB — COMPREHENSIVE METABOLIC PANEL WITH GFR
ALT: 18 U/L (ref 0–53)
AST: 15 U/L (ref 0–37)
Albumin: 4.5 g/dL (ref 3.5–5.2)
Alkaline Phosphatase: 74 U/L (ref 39–117)
BUN: 15 mg/dL (ref 6–23)
CO2: 29 meq/L (ref 19–32)
Calcium: 9.7 mg/dL (ref 8.4–10.5)
Chloride: 102 meq/L (ref 96–112)
Creatinine, Ser: 0.93 mg/dL (ref 0.40–1.50)
GFR: 82.56 mL/min (ref >60.00)
Glucose, Bld: 98 mg/dL (ref 70–99)
Potassium: 4.4 meq/L (ref 3.5–5.1)
Sodium: 139 meq/L (ref 135–145)
Total Bilirubin: 0.5 mg/dL (ref 0.2–1.2)
Total Protein: 7 g/dL (ref 6.0–8.3)

## 2023-12-12 LAB — CBC
HCT: 48 % (ref 39.0–52.0)
Hemoglobin: 16 g/dL (ref 13.0–17.0)
MCHC: 33.4 g/dL (ref 30.0–36.0)
MCV: 90.4 fl (ref 78.0–100.0)
Platelets: 237 10*3/uL (ref 150.0–400.0)
RBC: 5.3 Mil/uL (ref 4.22–5.81)
RDW: 13.4 % (ref 11.5–15.5)
WBC: 7.7 10*3/uL (ref 4.0–10.5)

## 2023-12-12 LAB — TSH: TSH: 1.2 u[IU]/mL (ref 0.35–5.50)

## 2023-12-13 ENCOUNTER — Encounter: Payer: Self-pay | Admitting: Nurse Practitioner

## 2023-12-14 ENCOUNTER — Other Ambulatory Visit: Payer: Self-pay | Admitting: Internal Medicine

## 2023-12-24 DIAGNOSIS — R002 Palpitations: Secondary | ICD-10-CM

## 2023-12-28 ENCOUNTER — Encounter: Payer: Self-pay | Admitting: Nurse Practitioner

## 2024-01-04 ENCOUNTER — Ambulatory Visit: Payer: Medicare Other | Admitting: Internal Medicine

## 2024-01-04 ENCOUNTER — Encounter: Payer: Self-pay | Admitting: Internal Medicine

## 2024-01-04 VITALS — BP 142/106 | HR 85 | Temp 98.1°F | Ht 74.0 in | Wt 248.0 lb

## 2024-01-04 DIAGNOSIS — I480 Paroxysmal atrial fibrillation: Secondary | ICD-10-CM

## 2024-01-04 DIAGNOSIS — J320 Chronic maxillary sinusitis: Secondary | ICD-10-CM | POA: Diagnosis not present

## 2024-01-04 DIAGNOSIS — I471 Supraventricular tachycardia, unspecified: Secondary | ICD-10-CM | POA: Diagnosis not present

## 2024-01-04 MED ORDER — HYDROXYZINE HCL 25 MG PO TABS: 25.0000 mg | ORAL_TABLET | Freq: Three times a day (TID) | ORAL | 5 refills | Status: DC | PRN

## 2024-01-04 MED ORDER — METOPROLOL SUCCINATE ER 25 MG PO TB24: 25.0000 mg | ORAL_TABLET | Freq: Every day | ORAL | 3 refills | Status: DC

## 2024-01-04 MED ORDER — DOXYCYCLINE HYCLATE 100 MG PO TABS: 100.0000 mg | ORAL_TABLET | Freq: Two times a day (BID) | ORAL | 5 refills | Status: DC

## 2024-01-04 NOTE — Progress Notes (Signed)
 Subjective:  Patient ID: John Boone, male    DOB: [birth year]  Age: 73 y.o. MRN: [id number]  CC: Medical Management of Chronic Issues (6 Month follow up. Patient notes still having palpitations occasionally (seen by Adella Agee NP in April for this). Palpitations triggering cough. Has since had a one week heart monitor. At home blood pressure log looks normal)   HPI John Boone presents for palpitations, HTN, wt gain SVT on a monitor BP nl at home HR 50-70  C/o poor sleep - lots of sinus congestion. Waking up every 1 hr Denies OSA  Outpatient Medications Prior to Visit  Medication Sig Dispense Refill   albuterol  (PROVENTIL  HFA) 108 (90 Base) MCG/ACT inhaler Inhale 2 puffs into the lungs 4 (four) times daily as needed for shortness of breath. 1 each 5   aspirin  EC 81 MG tablet Take 1 tablet (81 mg total) by mouth daily. 90 tablet 3   Cholecalciferol (VITAMIN D3) 50 MCG (2000 UT) capsule Take 1 capsule (2,000 Units total) by mouth daily. 100 capsule 3   Omega-3 Fatty Acids (FISH OIL TRIPLE STRENGTH) 1400 MG CAPS Take 1 capsule by mouth daily.     metoprolol  succinate (TOPROL -XL) 25 MG 24 hr tablet TAKE 1 TABLET (25 MG TOTAL) BY MOUTH DAILY. 90 tablet 7   hydrochlorothiazide  (HYDRODIURIL ) 12.5 MG tablet Take 1 tablet (12.5 mg total) by mouth daily. (Patient not taking: Reported on 01/04/2024) 90 tablet 0   No facility-administered medications prior to visit.    ROS: Review of Systems  Constitutional:  Positive for fatigue. Negative for appetite change and unexpected weight change.  HENT:  Negative for congestion, nosebleeds, sneezing, sore throat and trouble swallowing.   Eyes:  Negative for itching and visual disturbance.  Respiratory:  Negative for cough and shortness of breath.   Cardiovascular:  Positive for palpitations. Negative for chest pain and leg swelling.  Gastrointestinal:  Negative for abdominal distention, blood in stool, diarrhea and nausea.  Genitourinary:  Negative  for frequency and hematuria.  Musculoskeletal:  Negative for back pain, gait problem, joint swelling and neck pain.  Skin:  Negative for rash.  Neurological:  Negative for dizziness, tremors, speech difficulty and weakness.  Psychiatric/Behavioral:  Negative for agitation, dysphoric mood and sleep disturbance. The patient is not nervous/anxious.     Objective:  BP (!) 142/106   Pulse 85   Temp 98.1 F (36.7 C)   Ht 6\' 2"  (1.88 m)   Wt 248 lb (112.5 kg)   SpO2 98%   BMI 31.84 kg/m   BP Readings from Last 3 Encounters:  01/04/24 (!) 142/106  12/02/23 (!) 182/106  07/07/23 (!) 140/90    Wt Readings from Last 3 Encounters:  01/04/24 248 lb (112.5 kg)  12/02/23 246 lb (111.6 kg)  07/07/23 240 lb (108.9 kg)    Physical Exam Constitutional:      General: He is not in acute distress.    Appearance: He is well-developed. He is obese.     Comments: NAD  Eyes:     Conjunctiva/sclera: Conjunctivae normal.     Pupils: Pupils are equal, round, and reactive to light.  Neck:     Thyroid : No thyromegaly.     Vascular: No JVD.  Cardiovascular:     Rate and Rhythm: Normal rate. Rhythm irregular.     Heart sounds: Normal heart sounds. No murmur heard.    No friction rub. No gallop.  Pulmonary:     Effort: Pulmonary effort is  normal. No respiratory distress.     Breath sounds: Normal breath sounds. No wheezing or rales.  Chest:     Chest wall: No tenderness.  Abdominal:     General: Bowel sounds are normal. There is no distension.     Palpations: Abdomen is soft. There is no mass.     Tenderness: There is no abdominal tenderness. There is no guarding or rebound.  Musculoskeletal:        General: No tenderness. Normal range of motion.     Cervical back: Normal range of motion.  Lymphadenopathy:     Cervical: No cervical adenopathy.  Skin:    General: Skin is warm and dry.     Findings: No rash.  Neurological:     Mental Status: He is alert and oriented to person, place, and  time.     Cranial Nerves: No cranial nerve deficit.     Motor: No abnormal muscle tone.     Coordination: Coordination normal.     Gait: Gait normal.     Deep Tendon Reflexes: Reflexes are normal and symmetric.  Psychiatric:        Behavior: Behavior normal.        Thought Content: Thought content normal.        Judgment: Judgment normal.   Regular HR  w/irreg beats Rash on face   11/2023: Zio Patch Wear Time:  7 days and 16 hours   HR 45 - 132, average 78 bpm. 4 nonsustained SVT (longest 15 beats) No atrial fibrillation detected. Rare supraventricular ectopy. Rare ventricular ectopy. PVC burden <1% No sustained arrhythmias. Symptom trigger episodes correspond to sinus rhythm with PVCs    Lab Results  Component Value Date   WBC 7.7 12/12/2023   HGB 16.0 12/12/2023   HCT 48.0 12/12/2023   PLT 237.0 12/12/2023   GLUCOSE 98 12/12/2023   CHOL 173 07/07/2023   TRIG 97.0 07/07/2023   HDL 35.50 (L) 07/07/2023   LDLCALC 118 (H) 07/07/2023   ALT 18 12/12/2023   AST 15 12/12/2023   NA 139 12/12/2023   K 4.4 12/12/2023   CL 102 12/12/2023   CREATININE 0.93 12/12/2023   BUN 15 12/12/2023   CO2 29 12/12/2023   TSH 1.20 12/12/2023   PSA 1.12 07/07/2023    VAS US  CAROTID Result Date: 04/23/2020 Carotid Arterial Duplex Study Indications:       Bilateral bruits and patient c/o double vision but relates it                    to having cataracts. He denies any other cerebrovascular                    symptoms. Risk Factors:      Hypertension, hyperlipidemia, past history of smoking,                    coronary artery disease. Comparison Study:  NA Performing Technologist: Doren Gammons RVT  Examination Guidelines: A complete evaluation includes B-mode imaging, spectral Doppler, color Doppler, and power Doppler as needed of all accessible portions of each vessel. Bilateral testing is considered an integral part of a complete examination. Limited examinations for reoccurring indications  may be performed as noted.  Right Carotid Findings: +----------+--------+--------+--------+------------------+--------+           PSV cm/sEDV cm/sStenosisPlaque DescriptionComments +----------+--------+--------+--------+------------------+--------+ CCA Prox  91      17                                         +----------+--------+--------+--------+------------------+--------+  CCA Distal79      23                                         +----------+--------+--------+--------+------------------+--------+ ICA Prox  58      16                                         +----------+--------+--------+--------+------------------+--------+ ICA Mid   62      27      Normal                             +----------+--------+--------+--------+------------------+--------+ ICA Distal71      28                                         +----------+--------+--------+--------+------------------+--------+ ECA       96      24                                         +----------+--------+--------+--------+------------------+--------+ +----------+--------+-------+----------------+-------------------+           PSV cm/sEDV cmsDescribe        Arm Pressure (mmHG) +----------+--------+-------+----------------+-------------------+ Subclavian180            Mildly turbulent151                 +----------+--------+-------+----------------+-------------------+ +---------+--------+--+--------+-+ VertebralPSV cm/s25EDV cm/s6 +---------+--------+--+--------+-+  Left Carotid Findings: +----------+--------+--------+--------+------------------+--------+           PSV cm/sEDV cm/sStenosisPlaque DescriptionComments +----------+--------+--------+--------+------------------+--------+ CCA Prox  100     28                                         +----------+--------+--------+--------+------------------+--------+ CCA Distal73      19                                          +----------+--------+--------+--------+------------------+--------+ ICA Prox  58      21                                         +----------+--------+--------+--------+------------------+--------+ ICA Mid   71      29      Normal                             +----------+--------+--------+--------+------------------+--------+ ICA Distal81      34                                         +----------+--------+--------+--------+------------------+--------+ ECA       99      20                                         +----------+--------+--------+--------+------------------+--------+ +----------+--------+--------+----------------+-------------------+  PSV cm/sEDV cm/sDescribe        Arm Pressure (mmHG) +----------+--------+--------+----------------+-------------------+ Subclavian101             Multiphasic, [id number]                 +----------+--------+--------+----------------+-------------------+ +---------+--------+--+--------+--+---------+ VertebralPSV cm/s63EDV cm/s21Antegrade +---------+--------+--+--------+--+---------+   Summary: Right Carotid: There was no evidence of thrombus, dissection, atherosclerotic                plaque or stenosis in the cervical carotid system. Left Carotid: There was no evidence of thrombus, dissection, atherosclerotic               plaque or stenosis in the cervical carotid system. Vertebrals:  Bilateral vertebral arteries demonstrate antegrade flow. Subclavians: Right subclavian artery flow was mildly disturbed. Normal flow              hemodynamics were seen in the left subclavian artery. *See table(s) above for measurements and observations.  Electronically signed by Christoper Crafts MD on 04/23/2020 at 4:08:37 PM.    Final     Assessment & Plan:   Problem List Items Addressed This Visit     Atrial fibrillation (HCC)   No recent A fib      Relevant Medications   metoprolol  succinate (TOPROL -XL) 25 MG 24 hr tablet   SVT  (supraventricular tachycardia) (HCC) - Primary   11/2023: Zio Patch Wear Time:  7 days and 16 hours   HR 45 - 132, average 78 bpm. 4 nonsustained SVT (longest 15 beats) No atrial fibrillation detected. Rare supraventricular ectopy. Rare ventricular ectopy. PVC burden <1% No sustained arrhythmias. Symptom trigger episodes correspond to sinus rhythm with PVCs      Relevant Medications   metoprolol  succinate (TOPROL -XL) 25 MG 24 hr tablet   Sinusitis, chronic   Per Dr Donalee Fruits 2019: "1. Based on clinical evaluation and discussion with the patient, he does not have any evidence of progressive sinonasal disease contributing to his symptomatology. His chronic postnasal drainage is likely multifactorial and may be explained by prior sinonasal surgery, laryngopharyngeal reflux, and possible perennial allergies as well. Additionally, his sinus CT obtained at an outside clinic did not did not demonstrate any overt sinonasal disease 2. After discussion with the patient, we counseled him on use of a PPI at night, about 30 minutes prior to eating dinner. Additionally we counseled him that he may utilize atrovent  spray to bilateral nares 3 times daily as needed for chronic drainage. If he continues to be terribly symptomatic with chronic drainage, we counseled the patient that vidian cryotherapy may be a potential salvage option for persistent symptoms.  3. Follow-up as needed at this time "  Chin strap Hydroxyzine  to try      Relevant Medications   doxycycline  (VIBRA -TABS) 100 MG tablet   Other Relevant Orders   Ambulatory referral to Cardiology      Meds ordered this encounter  Medications   hydrOXYzine  (ATARAX ) 25 MG tablet    Sig: Take 1-2 tablets (25-50 mg total) by mouth every 8 (eight) hours as needed (allergies).    Dispense:  90 tablet    Refill:  5   doxycycline  (VIBRA -TABS) 100 MG tablet    Sig: Take 1 tablet (100 mg total) by mouth 2 (two) times daily.    Dispense:  60 tablet     Refill:  5   metoprolol  succinate (TOPROL -XL) 25 MG 24 hr tablet    Sig: Take 1 tablet (25 mg total) by mouth daily.  Dispense:  90 tablet    Refill:  3      Follow-up: Return in about 3 months (around 04/05/2024) for a follow-up visit.  Anitra Barn, MD

## 2024-01-04 NOTE — Patient Instructions (Signed)
?   Chin strap 

## 2024-01-04 NOTE — Assessment & Plan Note (Signed)
 11/2023: Zio Patch Wear Time:  7 days and 16 hours   HR 45 - 132, average 78 bpm. 4 nonsustained SVT (longest 15 beats) No atrial fibrillation detected. Rare supraventricular ectopy. Rare ventricular ectopy. PVC burden <1% No sustained arrhythmias. Symptom trigger episodes correspond to sinus rhythm with PVCs

## 2024-01-04 NOTE — Assessment & Plan Note (Addendum)
 Per Dr Donalee Fruits 2019: "1. Based on clinical evaluation and discussion with the patient, he does not have any evidence of progressive sinonasal disease contributing to his symptomatology. His chronic postnasal drainage is likely multifactorial and may be explained by prior sinonasal surgery, laryngopharyngeal reflux, and possible perennial allergies as well. Additionally, his sinus CT obtained at an outside clinic did not did not demonstrate any overt sinonasal disease 2. After discussion with the patient, we counseled him on use of a PPI at night, about 30 minutes prior to eating dinner. Additionally we counseled him that he may utilize atrovent  spray to bilateral nares 3 times daily as needed for chronic drainage. If he continues to be terribly symptomatic with chronic drainage, we counseled the patient that vidian cryotherapy may be a potential salvage option for persistent symptoms.  3. Follow-up as needed at this time "  Chin strap Hydroxyzine  to try

## 2024-01-04 NOTE — Assessment & Plan Note (Signed)
 No recent A fib

## 2024-01-12 ENCOUNTER — Other Ambulatory Visit: Payer: Self-pay | Admitting: Internal Medicine

## 2024-01-19 ENCOUNTER — Other Ambulatory Visit: Payer: Self-pay | Admitting: Internal Medicine

## 2024-03-09 ENCOUNTER — Other Ambulatory Visit: Payer: Self-pay | Admitting: Internal Medicine

## 2024-04-05 ENCOUNTER — Encounter: Payer: Self-pay | Admitting: Internal Medicine

## 2024-04-05 ENCOUNTER — Ambulatory Visit: Admitting: Internal Medicine

## 2024-04-05 VITALS — BP 150/90 | HR 73 | Temp 98.6°F | Ht 74.0 in | Wt 240.0 lb

## 2024-04-05 DIAGNOSIS — N320 Bladder-neck obstruction: Secondary | ICD-10-CM

## 2024-04-05 DIAGNOSIS — L719 Rosacea, unspecified: Secondary | ICD-10-CM | POA: Diagnosis not present

## 2024-04-05 DIAGNOSIS — J4520 Mild intermittent asthma, uncomplicated: Secondary | ICD-10-CM

## 2024-04-05 DIAGNOSIS — I2510 Atherosclerotic heart disease of native coronary artery without angina pectoris: Secondary | ICD-10-CM

## 2024-04-05 DIAGNOSIS — I480 Paroxysmal atrial fibrillation: Secondary | ICD-10-CM

## 2024-04-05 DIAGNOSIS — Z0000 Encounter for general adult medical examination without abnormal findings: Secondary | ICD-10-CM

## 2024-04-05 DIAGNOSIS — I10 Essential (primary) hypertension: Secondary | ICD-10-CM

## 2024-04-05 DIAGNOSIS — I2583 Coronary atherosclerosis due to lipid rich plaque: Secondary | ICD-10-CM

## 2024-04-05 DIAGNOSIS — J309 Allergic rhinitis, unspecified: Secondary | ICD-10-CM | POA: Diagnosis not present

## 2024-04-05 MED ORDER — METRONIDAZOLE 0.75 % EX GEL: 1.0000 | Freq: Two times a day (BID) | CUTANEOUS | 3 refills | Status: AC

## 2024-04-05 NOTE — Progress Notes (Signed)
 Subjective:  Patient ID: John Boone, male    DOB: 1951/01/23  Age: 73 y.o. MRN: [id number]  CC: Medical Management of Chronic Issues (3 mnth f/u )   HPI TAYVON CULLEY presents for HTN, palpitations, rosacea SBP 130 at home  Outpatient Medications Prior to Visit  Medication Sig Dispense Refill   albuterol  (PROVENTIL  HFA) 108 (90 Base) MCG/ACT inhaler Inhale 2 puffs into the lungs 4 (four) times daily as needed for shortness of breath. 1 each 5   aspirin  EC 81 MG tablet Take 1 tablet (81 mg total) by mouth daily. 90 tablet 3   Cholecalciferol (VITAMIN D3) 50 MCG (2000 UT) capsule Take 1 capsule (2,000 Units total) by mouth daily. 100 capsule 3   doxycycline  (VIBRA -TABS) 100 MG tablet TAKE 1 TABLET BY MOUTH TWICE A DAY 180 tablet 3   hydrOXYzine  (ATARAX ) 25 MG tablet TAKE 1-2 TABLETS (25-50 MG TOTAL) BY MOUTH EVERY 8 (EIGHT) HOURS AS NEEDED (ALLERGIES). 540 tablet 1   metoprolol  succinate (TOPROL -XL) 25 MG 24 hr tablet TAKE 1 TABLET BY MOUTH TWICE A DAY 180 tablet 3   Omega-3 Fatty Acids (FISH OIL TRIPLE STRENGTH) 1400 MG CAPS Take 1 capsule by mouth daily.     hydrochlorothiazide  (HYDRODIURIL ) 12.5 MG tablet Take 1 tablet (12.5 mg total) by mouth daily. (Patient not taking: Reported on 04/05/2024) 90 tablet 0   No facility-administered medications prior to visit.    ROS: Review of Systems  Constitutional:  Negative for appetite change, fatigue and unexpected weight change.  HENT:  Negative for congestion, nosebleeds, sneezing, sore throat and trouble swallowing.   Eyes:  Negative for itching and visual disturbance.  Respiratory:  Negative for cough.   Cardiovascular:  Negative for chest pain, palpitations and leg swelling.  Gastrointestinal:  Negative for abdominal distention, blood in stool, diarrhea and nausea.  Genitourinary:  Negative for frequency and hematuria.  Musculoskeletal:  Negative for back pain, gait problem, joint swelling and neck pain.  Skin:  Positive for color  change. Negative for rash.  Neurological:  Negative for dizziness, tremors, speech difficulty and weakness.  Psychiatric/Behavioral:  Negative for agitation, dysphoric mood and sleep disturbance. The patient is not nervous/anxious.     Objective:  BP (!) 150/90   Pulse 73   Temp 98.6 F (37 C) (Oral)   Ht 6' 2 (1.88 m)   Wt 240 lb (108.9 kg)   SpO2 98%   BMI 30.81 kg/m   BP Readings from Last 3 Encounters:  04/05/24 (!) 150/90  01/04/24 (!) 142/106  12/02/23 (!) 182/106    Wt Readings from Last 3 Encounters:  04/05/24 240 lb (108.9 kg)  01/04/24 248 lb (112.5 kg)  12/02/23 246 lb (111.6 kg)    Physical Exam Constitutional:      General: He is not in acute distress.    Appearance: He is well-developed. He is obese.     Comments: NAD  Eyes:     Conjunctiva/sclera: Conjunctivae normal.     Pupils: Pupils are equal, round, and reactive to light.  Neck:     Thyroid : No thyromegaly.     Vascular: No JVD.  Cardiovascular:     Rate and Rhythm: Normal rate and regular rhythm.     Heart sounds: Normal heart sounds. No murmur heard.    No friction rub. No gallop.  Pulmonary:     Effort: Pulmonary effort is normal. No respiratory distress.     Breath sounds: Normal breath sounds. No wheezing or  rales.  Chest:     Chest wall: No tenderness.  Abdominal:     General: Bowel sounds are normal. There is no distension.     Palpations: Abdomen is soft. There is no mass.     Tenderness: There is no abdominal tenderness. There is no guarding or rebound.  Musculoskeletal:        General: No tenderness. Normal range of motion.     Cervical back: Normal range of motion.  Lymphadenopathy:     Cervical: No cervical adenopathy.  Skin:    General: Skin is warm and dry.     Findings: No rash.  Neurological:     Mental Status: He is alert and oriented to person, place, and time.     Cranial Nerves: No cranial nerve deficit.     Motor: No abnormal muscle tone.     Coordination:  Coordination normal.     Gait: Gait normal.     Deep Tendon Reflexes: Reflexes are normal and symmetric.  Psychiatric:        Behavior: Behavior normal.        Thought Content: Thought content normal.        Judgment: Judgment normal.   rosacea  Lab Results  Component Value Date   WBC 7.7 12/12/2023   HGB 16.0 12/12/2023   HCT 48.0 12/12/2023   PLT 237.0 12/12/2023   GLUCOSE 98 12/12/2023   CHOL 173 07/07/2023   TRIG 97.0 07/07/2023   HDL 35.50 (L) 07/07/2023   LDLCALC 118 (H) 07/07/2023   ALT 18 12/12/2023   AST 15 12/12/2023   NA 139 12/12/2023   K 4.4 12/12/2023   CL 102 12/12/2023   CREATININE 0.93 12/12/2023   BUN 15 12/12/2023   CO2 29 12/12/2023   TSH 1.20 12/12/2023   PSA 1.12 07/07/2023    VAS US  CAROTID Result Date: 04/23/2020 Carotid Arterial Duplex Study Indications:       Bilateral bruits and patient c/o double vision but relates it                    to having cataracts. He denies any other cerebrovascular                    symptoms. Risk Factors:      Hypertension, hyperlipidemia, past history of smoking,                    coronary artery disease. Comparison Study:  NA Performing Technologist: Nanetta Shad RVT  Examination Guidelines: A complete evaluation includes B-mode imaging, spectral Doppler, color Doppler, and power Doppler as needed of all accessible portions of each vessel. Bilateral testing is considered an integral part of a complete examination. Limited examinations for reoccurring indications may be performed as noted.  Right Carotid Findings: +----------+--------+--------+--------+------------------+--------+           PSV cm/sEDV cm/sStenosisPlaque DescriptionComments +----------+--------+--------+--------+------------------+--------+ CCA Prox  91      17                                         +----------+--------+--------+--------+------------------+--------+ CCA Distal79      23                                          +----------+--------+--------+--------+------------------+--------+  ICA Prox  58      16                                         +----------+--------+--------+--------+------------------+--------+ ICA Mid   62      27      Normal                             +----------+--------+--------+--------+------------------+--------+ ICA Distal71      28                                         +----------+--------+--------+--------+------------------+--------+ ECA       96      24                                         +----------+--------+--------+--------+------------------+--------+ +----------+--------+-------+----------------+-------------------+           PSV cm/sEDV cmsDescribe        Arm Pressure (mmHG) +----------+--------+-------+----------------+-------------------+ Subclavian180            Mildly turbulent151                 +----------+--------+-------+----------------+-------------------+ +---------+--------+--+--------+-+ VertebralPSV cm/s25EDV cm/s6 +---------+--------+--+--------+-+  Left Carotid Findings: +----------+--------+--------+--------+------------------+--------+           PSV cm/sEDV cm/sStenosisPlaque DescriptionComments +----------+--------+--------+--------+------------------+--------+ CCA Prox  100     28                                         +----------+--------+--------+--------+------------------+--------+ CCA Distal73      19                                         +----------+--------+--------+--------+------------------+--------+ ICA Prox  58      21                                         +----------+--------+--------+--------+------------------+--------+ ICA Mid   71      29      Normal                             +----------+--------+--------+--------+------------------+--------+ ICA Distal81      34                                          +----------+--------+--------+--------+------------------+--------+ ECA       99      20                                         +----------+--------+--------+--------+------------------+--------+ +----------+--------+--------+----------------+-------------------+           PSV cm/sEDV cm/sDescribe  Arm Pressure (mmHG) +----------+--------+--------+----------------+-------------------+ Subclavian101             Multiphasic, [id number]                 +----------+--------+--------+----------------+-------------------+ +---------+--------+--+--------+--+---------+ VertebralPSV cm/s63EDV cm/s21Antegrade +---------+--------+--+--------+--+---------+   Summary: Right Carotid: There was no evidence of thrombus, dissection, atherosclerotic                plaque or stenosis in the cervical carotid system. Left Carotid: There was no evidence of thrombus, dissection, atherosclerotic               plaque or stenosis in the cervical carotid system. Vertebrals:  Bilateral vertebral arteries demonstrate antegrade flow. Subclavians: Right subclavian artery flow was mildly disturbed. Normal flow              hemodynamics were seen in the left subclavian artery. *See table(s) above for measurements and observations.  Electronically signed by Leim Moose MD on 04/23/2020 at 4:08:37 PM.    Final     Assessment & Plan:   Problem List Items Addressed This Visit     Asthma, mild intermittent   Better       Relevant Orders   CBC with Differential/Platelet   Atrial fibrillation (HCC) - Primary   No recent A fib On Metoprolol  50 mg bid prn ASA 325 mg if palpitations Appt Dr Pietro - being scheduled      Relevant Orders   TSH   Lipid panel   CAD (coronary artery disease)   On Kill oil       Relevant Orders   TSH   Urinalysis   CBC with Differential/Platelet   Lipid panel   Comprehensive metabolic panel with GFR   Essential hypertension   On Metoprolol  50 mg bid prn        Rosacea   Worse Continue on Doxycycline  Metro gel bid      Well adult exam   Relevant Orders   TSH   Urinalysis   CBC with Differential/Platelet   Lipid panel   PSA   Comprehensive metabolic panel with GFR   Other Visit Diagnoses       Allergic rhinitis, unspecified seasonality, unspecified trigger         Bladder neck obstruction       Relevant Orders   PSA         Meds ordered this encounter  Medications   metroNIDAZOLE  (METROGEL ) 0.75 % gel    Sig: Apply 1 Application topically 2 (two) times daily.    Dispense:  45 g    Refill:  3      Follow-up: Return in about 4 months (around 08/05/2024) for a follow-up visit.  Marolyn Noel, MD

## 2024-04-05 NOTE — Assessment & Plan Note (Signed)
 On Metoprolol  50 mg bid prn

## 2024-04-05 NOTE — Assessment & Plan Note (Addendum)
 No recent A fib On Metoprolol  50 mg bid prn ASA 325 mg if palpitations Appt Dr Pietro - being scheduled

## 2024-04-05 NOTE — Assessment & Plan Note (Signed)
On Kill oil

## 2024-04-05 NOTE — Assessment & Plan Note (Addendum)
 Worse Continue on Doxycycline  Metro gel bid

## 2024-04-05 NOTE — Addendum Note (Signed)
 Addended by: Kaniel Kiang V on: 04/05/2024 10:01 AM   Modules accepted: Level of Service

## 2024-04-05 NOTE — Assessment & Plan Note (Signed)
 Better

## 2024-05-17 ENCOUNTER — Ambulatory Visit: Payer: Self-pay

## 2024-05-17 ENCOUNTER — Ambulatory Visit: Admitting: Family Medicine

## 2024-05-17 NOTE — Telephone Encounter (Signed)
 FYI Only or Action Required?: FYI only for provider.  Patient was last seen in primary care on 04/05/2024 by Plotnikov, Karlynn GAILS, MD.  Called Nurse Triage reporting Cough and Shortness of Breath.  Symptoms began 05/06/24.  Interventions attempted: Prescription medications: albuterol  inhaler.  Symptoms are: mild SOB, dry cough, chest congestion, upper back/shoulder blade pressure gradually worsening.  Triage Disposition: See HCP Within 4 Hours (Or PCP Triage)  Patient/caregiver understands and will follow disposition?: Yes               Copied from CRM [id number]. Topic: Clinical - Red Word Triage >> May 17, 2024  8:19 AM Pinkey ORN wrote: Red Word that prompted transfer to Nurse Triage: Shortness Of Breath >> May 17, 2024  8:20 AM Pinkey ORN wrote: Patient states he's been experiencing shortness of breath, feverish with no fever, sweats and congestion in the lungs.  Reason for Disposition  [1] MILD difficulty breathing (e.g., minimal/no SOB at rest, SOB with walking, pulse < 100) AND [2] still present when not coughing  Answer Assessment - Initial Assessment Questions 1. ONSET: When did the cough begin?      05/06/24.  2. SEVERITY: How bad is the cough today?      Not terrible, but not good. He states there is some SOB that comes with it.  3. SPUTUM: Describe the color of your sputum (e.g., none, dry cough; clear, white, yellow, green)     Dry cough.  4. HEMOPTYSIS: Are you coughing up any blood? If Yes, ask: How much? (e.g., flecks, streaks, tablespoons, etc.)     No.  5. DIFFICULTY BREATHING: Are you having difficulty breathing? If Yes, ask: How bad is it? (e.g., mild, moderate, severe)      Yes, mild.  6. FEVER: Do you have a fever? If Yes, ask: What is your temperature, how was it measured, and when did it start?     No fever, highest temp was 99-100. Feels feverish/sweaty.  7. CARDIAC HISTORY: Do you have any history of heart disease?  (e.g., heart attack, congestive heart failure)      Irregular heart beat.  8. LUNG HISTORY: Do you have any history of lung disease?  (e.g., pulmonary embolus, asthma, emphysema)     Asthma.  9. PE RISK FACTORS: Do you have a history of blood clots? (or: recent major surgery, recent prolonged travel, bedridden)     No.  10. OTHER SYMPTOMS: Do you have any other symptoms? (e.g., runny nose, wheezing, chest pain)       Pressure under both shoulder blades (more towards his upper back). He states when he feels feverish he feels spacey instead of lightheaded. He states this feels like when he had pneumonia. Left ear feels blocked up. Denies chest pain, runny nose, sore throat.  11. PREGNANCY: Is there any chance you are pregnant? When was your last menstrual period?       N/A.  12. TRAVEL: Have you traveled out of the country in the last month? (e.g., travel history, exposures)       No.  Protocols used: Cough - Acute Non-Productive-A-AH

## 2024-05-20 ENCOUNTER — Encounter: Payer: Self-pay | Admitting: Internal Medicine

## 2024-05-20 NOTE — Patient Instructions (Addendum)
       Medications changes include :   None       Return if symptoms worsen or fail to improve.

## 2024-05-20 NOTE — Progress Notes (Unsigned)
    Subjective:    Patient ID: John Boone, male    DOB: 07-17-1951, 73 y.o.   MRN: [id number]      HPI John Boone is here for No chief complaint on file.        Medications and allergies reviewed with patient and updated if appropriate.  Current Outpatient Medications on File Prior to Visit  Medication Sig Dispense Refill   albuterol  (PROVENTIL  HFA) 108 (90 Base) MCG/ACT inhaler Inhale 2 puffs into the lungs 4 (four) times daily as needed for shortness of breath. 1 each 5   aspirin  EC 81 MG tablet Take 1 tablet (81 mg total) by mouth daily. 90 tablet 3   Cholecalciferol (VITAMIN D3) 50 MCG (2000 UT) capsule Take 1 capsule (2,000 Units total) by mouth daily. 100 capsule 3   doxycycline  (VIBRA -TABS) 100 MG tablet TAKE 1 TABLET BY MOUTH TWICE A DAY 180 tablet 3   hydrochlorothiazide  (HYDRODIURIL ) 12.5 MG tablet Take 1 tablet (12.5 mg total) by mouth daily. (Patient not taking: Reported on 04/05/2024) 90 tablet 0   hydrOXYzine  (ATARAX ) 25 MG tablet TAKE 1-2 TABLETS (25-50 MG TOTAL) BY MOUTH EVERY 8 (EIGHT) HOURS AS NEEDED (ALLERGIES). 540 tablet 1   metoprolol  succinate (TOPROL -XL) 25 MG 24 hr tablet TAKE 1 TABLET BY MOUTH TWICE A DAY 180 tablet 3   metroNIDAZOLE  (METROGEL ) 0.75 % gel Apply 1 Application topically 2 (two) times daily. 45 g 3   Omega-3 Fatty Acids (FISH OIL TRIPLE STRENGTH) 1400 MG CAPS Take 1 capsule by mouth daily.     No current facility-administered medications on file prior to visit.    Review of Systems     Objective:  There were no vitals filed for this visit. BP Readings from Last 3 Encounters:  04/05/24 (!) 150/90  01/04/24 (!) 142/106  12/02/23 (!) 182/106   Wt Readings from Last 3 Encounters:  04/05/24 240 lb (108.9 kg)  01/04/24 248 lb (112.5 kg)  12/02/23 246 lb (111.6 kg)   There is no height or weight on file to calculate BMI.    Physical Exam Constitutional:      General: He is not in acute distress.    Appearance: Normal appearance. He is  not ill-appearing.  HENT:     Head: Normocephalic.     Right Ear: Tympanic membrane, ear canal and external ear normal. There is no impacted cerumen.     Left Ear: Tympanic membrane, ear canal and external ear normal. There is no impacted cerumen.     Mouth/Throat:     Mouth: Mucous membranes are moist.     Pharynx: No oropharyngeal exudate or posterior oropharyngeal erythema.  Eyes:     Conjunctiva/sclera: Conjunctivae normal.  Cardiovascular:     Rate and Rhythm: Normal rate and regular rhythm.  Pulmonary:     Effort: Pulmonary effort is normal. No respiratory distress.     Breath sounds: Normal breath sounds. No wheezing or rales.  Musculoskeletal:     Cervical back: Neck supple. No tenderness.  Lymphadenopathy:     Cervical: No cervical adenopathy.  Skin:    General: Skin is warm and dry.     Findings: No rash.  Neurological:     Mental Status: He is alert.            Assessment & Plan:    See Problem List for Assessment and Plan of chronic medical problems.

## 2024-05-21 ENCOUNTER — Ambulatory Visit (INDEPENDENT_AMBULATORY_CARE_PROVIDER_SITE_OTHER): Admitting: Internal Medicine

## 2024-05-21 VITALS — BP 154/88 | HR 81 | Temp 98.2°F | Ht 74.0 in | Wt 244.0 lb

## 2024-05-21 DIAGNOSIS — J3489 Other specified disorders of nose and nasal sinuses: Secondary | ICD-10-CM

## 2024-05-21 DIAGNOSIS — J019 Acute sinusitis, unspecified: Secondary | ICD-10-CM | POA: Diagnosis not present

## 2024-05-21 LAB — POC COVID19 BINAXNOW: SARS Coronavirus 2 Ag: NEGATIVE

## 2024-05-21 MED ORDER — AMOXICILLIN-POT CLAVULANATE 875-125 MG PO TABS: 1.0000 | ORAL_TABLET | Freq: Two times a day (BID) | ORAL | 0 refills | Status: AC

## 2024-06-22 NOTE — Progress Notes (Signed)
 Referring-Alex Plotnikov MD Reason for referral-PAF  HPI: 73 yo male for evaluation of CAD and palpitations at request of Marolyn Noel MD. Seen previously but not since 2/21. The patient had a stress test in 1996 that was negative. He also had a monitor placed and was noted to have SVT at a rate of 227. 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 Ablation was discussed but the patient preferred medical therapy. Echocardiogram in 2009 showed normal LV function. Chest CT in October of 2013 showed coronary atherosclerosis.  Nuclear study November 2013 showed ejection fraction 62%, soft tissue attenuation, no ischemia. Carotid dopplers 8/21 with no disease. Monitor 4/25 showed sinus with PACs, PVCs and short runs of SVT.  Patient has chronic palpitations but in May they worsened.  They were described as a skip with a pause and also brief heart racing.  Some pain in his jaws when his heart races transiently and mild dizziness.  He otherwise denies dyspnea on exertion, orthopnea, PND, pedal edema, exertional chest pain or syncope.  Cardiology now asked to evaluate.  Current Outpatient Medications  Medication Sig Dispense Refill   albuterol  (PROVENTIL  HFA) 108 (90 Base) MCG/ACT inhaler Inhale 2 puffs into the lungs 4 (four) times daily as needed for shortness of breath. 1 each 5   aspirin  EC 81 MG tablet Take 1 tablet (81 mg total) by mouth daily. 90 tablet 3   Cholecalciferol (VITAMIN D3) 50 MCG (2000 UT) capsule Take 1 capsule (2,000 Units total) by mouth daily. 100 capsule 3   doxycycline  (VIBRA -TABS) 100 MG tablet TAKE 1 TABLET BY MOUTH TWICE A DAY 180 tablet 3   hydrochlorothiazide  (HYDRODIURIL ) 12.5 MG tablet Take 1 tablet (12.5 mg total) by mouth daily. (Patient not taking: Reported on 05/21/2024) 90 tablet 0   hydrOXYzine  (ATARAX ) 25 MG tablet TAKE 1-2 TABLETS (25-50 MG TOTAL) BY MOUTH EVERY 8 (EIGHT) HOURS AS NEEDED (ALLERGIES). 540 tablet 1   metoprolol  succinate  (TOPROL -XL) 25 MG 24 hr tablet TAKE 1 TABLET BY MOUTH TWICE A DAY 180 tablet 3   metroNIDAZOLE  (METROGEL ) 0.75 % gel Apply 1 Application topically 2 (two) times daily. 45 g 3   Omega-3 Fatty Acids (FISH OIL TRIPLE STRENGTH) 1400 MG CAPS Take 1 capsule by mouth daily.     No current facility-administered medications for this visit.    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/2018   severe atypia--mid upper back   HTN (hypertension)    Nephrolithiasis    Nummular eczema    [facility]at[facility]   Rosacea    SVT (supraventricular tachycardia)     Past Surgical History:  Procedure Laterality Date   LIPOMA EXCISION     SINUS SURGERY WITH INSTATRAK     TONSILLECTOMY      Social History   Socioeconomic History   Marital status: Married    Spouse name: Not on file   Number of children: 2   Years of education: Not  on file   Highest education level: Not on file  Occupational History   Occupation: Help Desk Analyst    Employer: [employer]  Tobacco Use   Smoking status: Former    Current packs/day: 0.00    Average packs/day: 1 pack/day for 30.0 years (30.0 ttl pk-yrs)    Types: Cigarettes    Start date: 08/30/1964    Quit date: 08/30/1994    Years since quitting: 29.8   Smokeless tobacco: Never   Tobacco comments:    quit 1996  Substance and Sexual Activity   Alcohol use: Yes    Alcohol/week: 14.0 standard drinks of alcohol    Types: 14 Cans of beer per week    Comment: Occasional   Drug use: No   Sexual activity: Yes  Other Topics Concern   Not on file  Social History Narrative   Regular Exercise-Yes   Works at [employer] as  Psychologist, Clinical.   Married.         Social Drivers of Corporate Investment Banker Strain: Not on file  Food Insecurity: Not on file  Transportation Needs: Not on file  Physical Activity: Not on file  Stress: Not on file  Social Connections: Not on file  Intimate Partner Violence: Not on file    Family History  Problem Relation Age of Onset   Lymphoma Mother        deceased   Heart attack Mother        deceased   Cancer Mother 58       lymphoma   Colon polyps Father    COPD Father    Congestive Heart Failure Father    Colon cancer Neg Hx     ROS: no fevers or chills, productive cough, hemoptysis, dysphasia, odynophagia, melena, hematochezia, dysuria, hematuria, rash, seizure activity, orthopnea, PND, pedal edema, claudication. Remaining systems are negative.  Physical Exam:   There were no vitals taken for this visit.  General:  Well developed/well nourished in NAD Skin warm/dry Patient not depressed No peripheral clubbing Back-normal HEENT-normal/normal eyelids Neck supple/normal carotid upstroke bilaterally; no bruits; no JVD; no thyromegaly chest - CTA/ normal expansion CV - RRR/normal S1 and S2; no murmurs, rubs or gallops;  PMI nondisplaced Abdomen -NT/ND, no HSM, no mass, + bowel sounds, no bruit 2+ femoral pulses, no bruits Ext-no edema, chords, 2+ DP Neuro-grossly nonfocal  ECG -December 02, 2023-normal sinus rhythm, right bundle branch block.  Personally reviewed  A/P  1 history of supraventricular tachycardia-patient has occasional heart racing for 1 to 2 minutes.  His symptoms have improved since May and he would like to be conservative.  Will increase Toprol  to 50 mg twice daily and follow.  If he has more frequent episodes in the future or more sustained we could consider referral for ablation at that time if he is agreeable.  Repeat echocardiogram.   2 history of coronary artery disease-based on coronary calcification on CT scan.  Previous nuclear  study showed no ischemia.  Continue ASA; declines statin.    3 hypertension-blood pressure elevated.  I am increasing Toprol  to 50 mg twice daily both for palpitations and blood pressure.  He will track his blood pressure at home and we will add additional medications as needed with goal systolic blood pressure 130 and diastolic blood pressure 85.   4 question history of atrial fibrillation-I can find no documentation of this.  Likely related to previous diagnosis of SVT.  Redell Shallow, MD

## 2024-07-05 ENCOUNTER — Ambulatory Visit: Attending: Cardiology | Admitting: Cardiology

## 2024-07-05 ENCOUNTER — Encounter: Payer: Self-pay | Admitting: Cardiology

## 2024-07-05 VITALS — BP 156/100 | HR 84 | Ht 74.0 in | Wt 248.6 lb

## 2024-07-05 DIAGNOSIS — R002 Palpitations: Secondary | ICD-10-CM | POA: Insufficient documentation

## 2024-07-05 DIAGNOSIS — I471 Supraventricular tachycardia, unspecified: Secondary | ICD-10-CM | POA: Insufficient documentation

## 2024-07-05 DIAGNOSIS — I2510 Atherosclerotic heart disease of native coronary artery without angina pectoris: Secondary | ICD-10-CM | POA: Diagnosis present

## 2024-07-05 DIAGNOSIS — I2583 Coronary atherosclerosis due to lipid rich plaque: Secondary | ICD-10-CM | POA: Insufficient documentation

## 2024-07-05 DIAGNOSIS — I10 Essential (primary) hypertension: Secondary | ICD-10-CM | POA: Insufficient documentation

## 2024-07-05 MED ORDER — METOPROLOL SUCCINATE ER 50 MG PO TB24: 50.0000 mg | ORAL_TABLET | Freq: Two times a day (BID) | ORAL | 3 refills | Status: AC

## 2024-07-05 NOTE — Patient Instructions (Signed)
 Medication Instructions:   INCREASE METOPROLOL  TO 50 MG TWICE DAILY= 2 OF THE 25 MG TABLETS ONCE DAILY  *If you need a refill on your cardiac medications before your next appointment, please call your pharmacy*  Testing/Procedures:  Your physician has requested that you have an echocardiogram. Echocardiography is a painless test that uses sound waves to create images of your heart. It provides your doctor with information about the size and shape of your heart and how well your heart's chambers and valves are working. This procedure takes approximately one hour. There are no restrictions for this procedure. Please do NOT wear cologne, perfume, aftershave, or lotions (deodorant is allowed). Please arrive 15 minutes prior to your appointment time.  Please note: We ask at that you not bring children with you during ultrasound (echo/ vascular) testing. Due to room size and safety concerns, children are not allowed in the ultrasound rooms during exams. Our front office staff cannot provide observation of children in our lobby area while testing is being conducted. An adult accompanying a patient to their appointment will only be allowed in the ultrasound room at the discretion of the ultrasound technician under special circumstances. We apologize for any inconvenience. [address]  Follow-Up: At [facility], you and your health needs are our priority.  As part of our continuing mission to provide you with exceptional heart care, our providers are all part of one team.  This team includes your primary Cardiologist (physician) and Advanced Practice Providers or APPs (Physician Assistants and Nurse Practitioners) who all work together to provide you with the care you need, when you need it.  Your next appointment:   12 month(s)  Provider:   REDELL SHALLOW MD

## 2024-08-06 ENCOUNTER — Ambulatory Visit: Admitting: Internal Medicine

## 2024-08-06 ENCOUNTER — Encounter: Payer: Self-pay | Admitting: Internal Medicine

## 2024-08-06 VITALS — BP 162/102 | HR 84 | Temp 97.7°F | Ht 74.0 in | Wt 247.4 lb

## 2024-08-06 DIAGNOSIS — K219 Gastro-esophageal reflux disease without esophagitis: Secondary | ICD-10-CM

## 2024-08-06 DIAGNOSIS — I10 Essential (primary) hypertension: Secondary | ICD-10-CM

## 2024-08-06 DIAGNOSIS — J4520 Mild intermittent asthma, uncomplicated: Secondary | ICD-10-CM

## 2024-08-06 DIAGNOSIS — I2510 Atherosclerotic heart disease of native coronary artery without angina pectoris: Secondary | ICD-10-CM

## 2024-08-06 DIAGNOSIS — I480 Paroxysmal atrial fibrillation: Secondary | ICD-10-CM

## 2024-08-06 MED ORDER — FAMOTIDINE 20 MG PO TABS: 20.0000 mg | ORAL_TABLET | Freq: Two times a day (BID) | ORAL | 11 refills | Status: AC

## 2024-08-06 MED ORDER — TRIAMTERENE-HCTZ 37.5-25 MG PO TABS: 1.0000 | ORAL_TABLET | Freq: Every day | ORAL | 11 refills | Status: AC

## 2024-08-06 NOTE — Assessment & Plan Note (Signed)
 Worse. Declined PPI Will try Pepcid  20 mg bid

## 2024-08-06 NOTE — Progress Notes (Signed)
 Subjective:  Patient ID: John Boone, male    DOB: 1951-04-23  Age: 73 y.o. MRN: [id number]  CC: Medical Management of Chronic Issues (4 Month follow up)   HPI John Boone presents for HTN, palpitations, rosacea BP is elevated at home  Outpatient Medications Prior to Visit  Medication Sig Dispense Refill   albuterol  (PROVENTIL  HFA) 108 (90 Base) MCG/ACT inhaler Inhale 2 puffs into the lungs 4 (four) times daily as needed for shortness of breath. 1 each 5   aspirin  EC 81 MG tablet Take 1 tablet (81 mg total) by mouth daily. 90 tablet 3   Cholecalciferol (VITAMIN D3) 50 MCG (2000 UT) capsule Take 1 capsule (2,000 Units total) by mouth daily. 100 capsule 3   doxycycline  (VIBRA -TABS) 100 MG tablet TAKE 1 TABLET BY MOUTH TWICE A DAY 180 tablet 3   hydrOXYzine  (ATARAX ) 25 MG tablet TAKE 1-2 TABLETS (25-50 MG TOTAL) BY MOUTH EVERY 8 (EIGHT) HOURS AS NEEDED (ALLERGIES). 540 tablet 1   loratadine  (CLARITIN ) 10 MG tablet Take 10 mg by mouth daily.     metoprolol  succinate (TOPROL -XL) 50 MG 24 hr tablet Take 1 tablet (50 mg total) by mouth 2 (two) times daily. 180 tablet 3   metroNIDAZOLE  (METROGEL ) 0.75 % gel Apply 1 Application topically 2 (two) times daily. 45 g 3   Omega-3 Fatty Acids (FISH OIL TRIPLE STRENGTH) 1400 MG CAPS Take 1 capsule by mouth daily.     hydrochlorothiazide  (HYDRODIURIL ) 12.5 MG tablet Take 1 tablet (12.5 mg total) by mouth daily. (Patient not taking: Reported on 08/06/2024) 90 tablet 0   No facility-administered medications prior to visit.    ROS: Review of Systems  Constitutional:  Negative for appetite change, fatigue and unexpected weight change.  HENT:  Negative for congestion, nosebleeds, sneezing, sore throat and trouble swallowing.   Eyes:  Negative for itching and visual disturbance.  Respiratory:  Negative for cough.   Cardiovascular:  Negative for chest pain, palpitations and leg swelling.  Gastrointestinal:  Negative for abdominal distention, blood in  stool, diarrhea and nausea.  Genitourinary:  Negative for frequency and hematuria.  Musculoskeletal:  Negative for back pain, gait problem, joint swelling and neck pain.  Skin:  Negative for rash.  Neurological:  Negative for dizziness, tremors, speech difficulty and weakness.  Psychiatric/Behavioral:  Negative for agitation, dysphoric mood and sleep disturbance. The patient is not nervous/anxious.     Objective:  BP (!) 162/102   Pulse 84   Temp 97.7 F (36.5 C)   Ht 6' 2 (1.88 m)   Wt 247 lb 6.4 oz (112.2 kg)   SpO2 99%   BMI 31.76 kg/m   BP Readings from Last 3 Encounters:  08/06/24 (!) 162/102  07/05/24 (!) 156/100  05/21/24 (!) 154/88    Wt Readings from Last 3 Encounters:  08/06/24 247 lb 6.4 oz (112.2 kg)  07/05/24 248 lb 9.6 oz (112.8 kg)  05/21/24 244 lb (110.7 kg)    Physical Exam Constitutional:      General: He is not in acute distress.    Appearance: He is well-developed. He is obese.     Comments: NAD  Eyes:     Conjunctiva/sclera: Conjunctivae normal.     Pupils: Pupils are equal, round, and reactive to light.  Neck:     Thyroid : No thyromegaly.     Vascular: No JVD.  Cardiovascular:     Rate and Rhythm: Normal rate and regular rhythm.     Heart sounds: Normal  heart sounds. No murmur heard.    No friction rub. No gallop.  Pulmonary:     Effort: Pulmonary effort is normal. No respiratory distress.     Breath sounds: Normal breath sounds. No wheezing or rales.  Chest:     Chest wall: No tenderness.  Abdominal:     General: Bowel sounds are normal. There is no distension.     Palpations: Abdomen is soft. There is no mass.     Tenderness: There is no abdominal tenderness. There is no guarding or rebound.  Musculoskeletal:        General: No tenderness. Normal range of motion.     Cervical back: Normal range of motion.  Lymphadenopathy:     Cervical: No cervical adenopathy.  Skin:    General: Skin is warm and dry.     Findings: No rash.   Neurological:     Mental Status: He is alert and oriented to person, place, and time.     Cranial Nerves: No cranial nerve deficit.     Motor: No abnormal muscle tone.     Coordination: Coordination normal.     Gait: Gait normal.     Deep Tendon Reflexes: Reflexes are normal and symmetric.  Psychiatric:        Behavior: Behavior normal.        Thought Content: Thought content normal.        Judgment: Judgment normal.    Rosacea on face  Lab Results  Component Value Date   WBC 7.7 12/12/2023   HGB 16.0 12/12/2023   HCT 48.0 12/12/2023   PLT 237.0 12/12/2023   GLUCOSE 98 12/12/2023   CHOL 173 07/07/2023   TRIG 97.0 07/07/2023   HDL 35.50 (L) 07/07/2023   LDLCALC 118 (H) 07/07/2023   ALT 18 12/12/2023   AST 15 12/12/2023   NA 139 12/12/2023   K 4.4 12/12/2023   CL 102 12/12/2023   CREATININE 0.93 12/12/2023   BUN 15 12/12/2023   CO2 29 12/12/2023   TSH 1.20 12/12/2023   PSA 1.12 07/07/2023    VAS US  CAROTID Result Date: 04/23/2020 Carotid Arterial Duplex Study Indications:       Bilateral bruits and patient c/o double vision but relates it                    to having cataracts. He denies any other cerebrovascular                    symptoms. Risk Factors:      Hypertension, hyperlipidemia, past history of smoking,                    coronary artery disease. Comparison Study:  NA Performing Technologist: Nanetta Shad RVT  Examination Guidelines: A complete evaluation includes B-mode imaging, spectral Doppler, color Doppler, and power Doppler as needed of all accessible portions of each vessel. Bilateral testing is considered an integral part of a complete examination. Limited examinations for reoccurring indications may be performed as noted.  Right Carotid Findings: +----------+--------+--------+--------+------------------+--------+           PSV cm/sEDV cm/sStenosisPlaque DescriptionComments +----------+--------+--------+--------+------------------+--------+ CCA Prox   91      17                                         +----------+--------+--------+--------+------------------+--------+ CCA Distal79  23                                         +----------+--------+--------+--------+------------------+--------+ ICA Prox  58      16                                         +----------+--------+--------+--------+------------------+--------+ ICA Mid   62      27      Normal                             +----------+--------+--------+--------+------------------+--------+ ICA Distal71      28                                         +----------+--------+--------+--------+------------------+--------+ ECA       96      24                                         +----------+--------+--------+--------+------------------+--------+ +----------+--------+-------+----------------+-------------------+           PSV cm/sEDV cmsDescribe        Arm Pressure (mmHG) +----------+--------+-------+----------------+-------------------+ Subclavian180            Mildly turbulent151                 +----------+--------+-------+----------------+-------------------+ +---------+--------+--+--------+-+ VertebralPSV cm/s25EDV cm/s6 +---------+--------+--+--------+-+  Left Carotid Findings: +----------+--------+--------+--------+------------------+--------+           PSV cm/sEDV cm/sStenosisPlaque DescriptionComments +----------+--------+--------+--------+------------------+--------+ CCA Prox  100     28                                         +----------+--------+--------+--------+------------------+--------+ CCA Distal73      19                                         +----------+--------+--------+--------+------------------+--------+ ICA Prox  58      21                                         +----------+--------+--------+--------+------------------+--------+ ICA Mid   71      29      Normal                              +----------+--------+--------+--------+------------------+--------+ ICA Distal81      34                                         +----------+--------+--------+--------+------------------+--------+ ECA       99      20                                         +----------+--------+--------+--------+------------------+--------+ +----------+--------+--------+----------------+-------------------+  PSV cm/sEDV cm/sDescribe        Arm Pressure (mmHG) +----------+--------+--------+----------------+-------------------+ Subclavian101             Multiphasic, [id number]                 +----------+--------+--------+----------------+-------------------+ +---------+--------+--+--------+--+---------+ VertebralPSV cm/s63EDV cm/s21Antegrade +---------+--------+--+--------+--+---------+   Summary: Right Carotid: There was no evidence of thrombus, dissection, atherosclerotic                plaque or stenosis in the cervical carotid system. Left Carotid: There was no evidence of thrombus, dissection, atherosclerotic               plaque or stenosis in the cervical carotid system. Vertebrals:  Bilateral vertebral arteries demonstrate antegrade flow. Subclavians: Right subclavian artery flow was mildly disturbed. Normal flow              hemodynamics were seen in the left subclavian artery. *See table(s) above for measurements and observations.  Electronically signed by Leim Moose MD on 04/23/2020 at 4:08:37 PM.    Final     Assessment & Plan:   Problem List Items Addressed This Visit     Asthma, mild intermittent   Better       Atrial fibrillation (HCC)   No recent A fib On Metoprolol  25 mg bid prn (can't handle any higher dose) ASA 325 mg if palpitations F/u Dr Pietro       Relevant Medications   triamterene -hydrochlorothiazide  (MAXZIDE-25) 37.5-25 MG tablet   CAD (coronary artery disease)   On Kill oil       Relevant Medications   triamterene -hydrochlorothiazide   (MAXZIDE-25) 37.5-25 MG tablet   Essential hypertension - Primary   On Metoprolol  25 mg bid prn (can't handle any higher dose) F/u Dr Pietro  Will try Maxzide. Consider Benicar, Diltiazem        Relevant Medications   triamterene -hydrochlorothiazide  (MAXZIDE-25) 37.5-25 MG tablet   GERD (gastroesophageal reflux disease)   Worse. Declined PPI Will try Pepcid  20 mg bid      Relevant Medications   famotidine  (PEPCID ) 20 MG tablet      Meds ordered this encounter  Medications   triamterene -hydrochlorothiazide  (MAXZIDE-25) 37.5-25 MG tablet    Sig: Take 1 tablet by mouth daily.    Dispense:  30 tablet    Refill:  11   famotidine  (PEPCID ) 20 MG tablet    Sig: Take 1 tablet (20 mg total) by mouth 2 (two) times daily.    Dispense:  60 tablet    Refill:  11      Follow-up: Return in about 3 months (around 11/04/2024) for a follow-up visit.  Marolyn Noel, MD

## 2024-08-06 NOTE — Assessment & Plan Note (Addendum)
 No recent A fib On Metoprolol  25 mg bid prn (can't handle any higher dose) ASA 325 mg if palpitations F/u Dr Pietro

## 2024-08-06 NOTE — Patient Instructions (Signed)

## 2024-08-06 NOTE — Assessment & Plan Note (Signed)
 Better

## 2024-08-06 NOTE — Assessment & Plan Note (Signed)
On Kill oil

## 2024-08-06 NOTE — Assessment & Plan Note (Signed)
 On Metoprolol  25 mg bid prn (can't handle any higher dose) F/u Dr Pietro  Will try Maxzide. Consider Benicar, Diltiazem 

## 2024-08-13 ENCOUNTER — Ambulatory Visit: Payer: Self-pay | Admitting: Cardiology

## 2024-08-13 ENCOUNTER — Ambulatory Visit (HOSPITAL_COMMUNITY)
Admission: RE | Admit: 2024-08-13 | Discharge: 2024-08-13 | Disposition: A | Source: Ambulatory Visit | Attending: Cardiology | Admitting: Cardiology

## 2024-08-13 DIAGNOSIS — I2583 Coronary atherosclerosis due to lipid rich plaque: Secondary | ICD-10-CM | POA: Insufficient documentation

## 2024-08-13 DIAGNOSIS — I10 Essential (primary) hypertension: Secondary | ICD-10-CM

## 2024-08-13 DIAGNOSIS — I471 Supraventricular tachycardia, unspecified: Secondary | ICD-10-CM | POA: Diagnosis present

## 2024-08-13 DIAGNOSIS — I2510 Atherosclerotic heart disease of native coronary artery without angina pectoris: Secondary | ICD-10-CM | POA: Insufficient documentation

## 2024-08-13 DIAGNOSIS — R002 Palpitations: Secondary | ICD-10-CM | POA: Diagnosis present

## 2024-08-13 DIAGNOSIS — I4891 Unspecified atrial fibrillation: Secondary | ICD-10-CM | POA: Diagnosis not present

## 2024-08-13 LAB — ECHOCARDIOGRAM COMPLETE
Area-P 1/2: 5.23 cm2
S' Lateral: 3.2 cm

## 2024-08-20 ENCOUNTER — Other Ambulatory Visit

## 2024-08-20 ENCOUNTER — Ambulatory Visit: Payer: Self-pay | Admitting: Internal Medicine

## 2024-08-20 DIAGNOSIS — I2583 Coronary atherosclerosis due to lipid rich plaque: Secondary | ICD-10-CM | POA: Diagnosis not present

## 2024-08-20 DIAGNOSIS — Z0000 Encounter for general adult medical examination without abnormal findings: Secondary | ICD-10-CM

## 2024-08-20 DIAGNOSIS — N320 Bladder-neck obstruction: Secondary | ICD-10-CM

## 2024-08-20 DIAGNOSIS — I2510 Atherosclerotic heart disease of native coronary artery without angina pectoris: Secondary | ICD-10-CM | POA: Diagnosis not present

## 2024-08-20 DIAGNOSIS — J4520 Mild intermittent asthma, uncomplicated: Secondary | ICD-10-CM | POA: Diagnosis not present

## 2024-08-20 DIAGNOSIS — I480 Paroxysmal atrial fibrillation: Secondary | ICD-10-CM | POA: Diagnosis not present

## 2024-08-20 LAB — COMPREHENSIVE METABOLIC PANEL WITH GFR
ALT: 14 U/L (ref 3–53)
AST: 15 U/L (ref 5–37)
Albumin: 4.5 g/dL (ref 3.5–5.2)
Alkaline Phosphatase: 71 U/L (ref 39–117)
BUN: 16 mg/dL (ref 6–23)
CO2: 28 meq/L (ref 19–32)
Calcium: 10.2 mg/dL (ref 8.4–10.5)
Chloride: 101 meq/L (ref 96–112)
Creatinine, Ser: 1 mg/dL (ref 0.40–1.50)
GFR: 75.31 mL/min
Glucose, Bld: 97 mg/dL (ref 70–99)
Potassium: 4.3 meq/L (ref 3.5–5.1)
Sodium: 139 meq/L (ref 135–145)
Total Bilirubin: 0.6 mg/dL (ref 0.2–1.2)
Total Protein: 6.9 g/dL (ref 6.0–8.3)

## 2024-08-20 LAB — URINALYSIS
Bilirubin Urine: NEGATIVE
Hgb urine dipstick: NEGATIVE
Ketones, ur: NEGATIVE
Leukocytes,Ua: NEGATIVE
Nitrite: NEGATIVE
Specific Gravity, Urine: 1.015 (ref 1.000–1.030)
Total Protein, Urine: NEGATIVE
Urine Glucose: NEGATIVE
Urobilinogen, UA: 0.2 (ref 0.0–1.0)
pH: 6.5 (ref 5.0–8.0)

## 2024-08-20 LAB — CBC WITH DIFFERENTIAL/PLATELET
Basophils Absolute: 0 K/uL (ref 0.0–0.1)
Basophils Relative: 0.6 % (ref 0.0–3.0)
Eosinophils Absolute: 0.3 K/uL (ref 0.0–0.7)
Eosinophils Relative: 4.3 % (ref 0.0–5.0)
HCT: 46.2 % (ref 39.0–52.0)
Hemoglobin: 15.5 g/dL (ref 13.0–17.0)
Lymphocytes Relative: 28.7 % (ref 12.0–46.0)
Lymphs Abs: 2 K/uL (ref 0.7–4.0)
MCHC: 33.5 g/dL (ref 30.0–36.0)
MCV: 88.6 fl (ref 78.0–100.0)
Monocytes Absolute: 0.6 K/uL (ref 0.1–1.0)
Monocytes Relative: 8.5 % (ref 3.0–12.0)
Neutro Abs: 4.1 K/uL (ref 1.4–7.7)
Neutrophils Relative %: 57.9 % (ref 43.0–77.0)
Platelets: 236 K/uL (ref 150.0–400.0)
RBC: 5.21 Mil/uL (ref 4.22–5.81)
RDW: 14.1 % (ref 11.5–15.5)
WBC: 7 K/uL (ref 4.0–10.5)

## 2024-08-20 LAB — TSH: TSH: 1.25 u[IU]/mL (ref 0.35–5.50)

## 2024-08-20 LAB — LIPID PANEL
Cholesterol: 216 mg/dL — ABNORMAL HIGH (ref 28–200)
HDL: 45 mg/dL
LDL Cholesterol: 147 mg/dL — ABNORMAL HIGH (ref 10–99)
NonHDL: 171.24
Total CHOL/HDL Ratio: 5
Triglycerides: 120 mg/dL (ref 10.0–149.0)
VLDL: 24 mg/dL (ref 0.0–40.0)

## 2024-08-20 LAB — PSA: PSA: 1.42 ng/mL (ref 0.10–4.00)

## 2024-11-05 ENCOUNTER — Ambulatory Visit: Admitting: Internal Medicine
